# Patient Record
Sex: Female | Born: 1970
Health system: Southern US, Community
[De-identification: ages and names within clinical notes are randomized; demographics above are authoritative.]

## PROBLEM LIST (undated history)

## (undated) DIAGNOSIS — I7300 Raynaud's syndrome without gangrene: Secondary | ICD-10-CM

## (undated) DIAGNOSIS — M7989 Other specified soft tissue disorders: Secondary | ICD-10-CM

## (undated) DIAGNOSIS — T7840XA Allergy, unspecified, initial encounter: Secondary | ICD-10-CM

## (undated) DIAGNOSIS — M1990 Unspecified osteoarthritis, unspecified site: Secondary | ICD-10-CM

## (undated) DIAGNOSIS — E559 Vitamin D deficiency, unspecified: Secondary | ICD-10-CM

## (undated) DIAGNOSIS — F3281 Premenstrual dysphoric disorder: Secondary | ICD-10-CM

## (undated) DIAGNOSIS — E669 Obesity, unspecified: Secondary | ICD-10-CM

## (undated) DIAGNOSIS — K219 Gastro-esophageal reflux disease without esophagitis: Secondary | ICD-10-CM

## (undated) DIAGNOSIS — Z87442 Personal history of urinary calculi: Secondary | ICD-10-CM

## (undated) HISTORY — DX: Vitamin D deficiency, unspecified: E55.9

## (undated) HISTORY — DX: Other specified soft tissue disorders: M79.89

## (undated) HISTORY — DX: Gastro-esophageal reflux disease without esophagitis: K21.9

## (undated) HISTORY — DX: Obesity, unspecified: E66.9

## (undated) HISTORY — DX: Premenstrual dysphoric disorder: F32.81

## (undated) HISTORY — DX: Allergy, unspecified, initial encounter: T78.40XA

## (undated) HISTORY — PX: TONSILLECTOMY: SUR1361

---

## 2009-05-14 ENCOUNTER — Ambulatory Visit: Payer: Self-pay | Admitting: Urology

## 2009-08-22 ENCOUNTER — Ambulatory Visit: Payer: Self-pay | Admitting: Obstetrics and Gynecology

## 2010-05-06 ENCOUNTER — Ambulatory Visit: Payer: Self-pay | Admitting: Family Medicine

## 2010-09-18 ENCOUNTER — Ambulatory Visit: Payer: Self-pay | Admitting: Obstetrics and Gynecology

## 2011-10-14 ENCOUNTER — Ambulatory Visit: Payer: Self-pay | Admitting: Obstetrics and Gynecology

## 2011-10-29 ENCOUNTER — Ambulatory Visit: Payer: Self-pay | Admitting: Orthopedic Surgery

## 2012-10-14 ENCOUNTER — Ambulatory Visit: Payer: Self-pay | Admitting: Obstetrics and Gynecology

## 2014-02-08 ENCOUNTER — Ambulatory Visit: Payer: Self-pay | Admitting: Obstetrics and Gynecology

## 2014-11-27 ENCOUNTER — Other Ambulatory Visit: Payer: Self-pay | Admitting: Obstetrics and Gynecology

## 2014-11-27 DIAGNOSIS — Z1231 Encounter for screening mammogram for malignant neoplasm of breast: Secondary | ICD-10-CM

## 2015-02-11 ENCOUNTER — Ambulatory Visit
Admission: RE | Admit: 2015-02-11 | Discharge: 2015-02-11 | Disposition: A | Discharge status: Home / Self Care | Payer: Federal, State, Local not specified - PPO | Source: Ambulatory Visit | Attending: Obstetrics and Gynecology | Admitting: Obstetrics and Gynecology

## 2015-02-11 DIAGNOSIS — Z1231 Encounter for screening mammogram for malignant neoplasm of breast: Secondary | ICD-10-CM | POA: Diagnosis not present

## 2015-02-12 ENCOUNTER — Encounter: Payer: Self-pay | Admitting: *Deleted

## 2015-10-15 DIAGNOSIS — F341 Dysthymic disorder: Secondary | ICD-10-CM | POA: Diagnosis not present

## 2015-10-21 ENCOUNTER — Encounter: Payer: Self-pay | Admitting: *Deleted

## 2015-10-24 ENCOUNTER — Ambulatory Visit (INDEPENDENT_AMBULATORY_CARE_PROVIDER_SITE_OTHER): Payer: Federal, State, Local not specified - PPO | Admitting: Obstetrics and Gynecology

## 2015-10-24 ENCOUNTER — Encounter: Payer: Self-pay | Admitting: Obstetrics and Gynecology

## 2015-10-24 ENCOUNTER — Other Ambulatory Visit: Payer: Self-pay | Admitting: Obstetrics and Gynecology

## 2015-10-24 VITALS — BP 128/94 | HR 96 | Ht 61.0 in | Wt 199.7 lb

## 2015-10-24 DIAGNOSIS — Z01419 Encounter for gynecological examination (general) (routine) without abnormal findings: Secondary | ICD-10-CM

## 2015-10-24 DIAGNOSIS — R350 Frequency of micturition: Secondary | ICD-10-CM | POA: Diagnosis not present

## 2015-10-24 DIAGNOSIS — K589 Irritable bowel syndrome without diarrhea: Secondary | ICD-10-CM | POA: Diagnosis not present

## 2015-10-24 DIAGNOSIS — R300 Dysuria: Secondary | ICD-10-CM | POA: Diagnosis not present

## 2015-10-24 LAB — POCT URINALYSIS DIPSTICK
BILIRUBIN UA: NEGATIVE
GLUCOSE UA: NEGATIVE
Ketones, UA: NEGATIVE
LEUKOCYTES UA: NEGATIVE
NITRITE UA: NEGATIVE
Protein, UA: NEGATIVE
Spec Grav, UA: 1.01
Urobilinogen, UA: 0.2
pH, UA: 6

## 2015-10-24 MED ORDER — OXYBUTYNIN CHLORIDE ER 10 MG PO TB24: 10.0000 mg | ORAL_TABLET | Freq: Every day | ORAL | Status: DC

## 2015-10-24 NOTE — Progress Notes (Signed)
Subjective:   Sierra Moore is a 45 y.o. G42P0 Caucasian female here for a routine well-woman exam.  No LMP recorded. Patient is not currently having periods (Reason: Oral contraceptives).    Current complaints: frequent urination w/o pain- >6 times a day and >3 times a night; Last months has been having loose stools after eating- has been keeping a food diary but nothing has jumped out at her yet. No menses with current pills PCP: me       Does need &  desire labs  Social History: Sexual: heterosexual Marital Status: married Living situation: with family Occupation: PT Gaffer Tobacco/alcohol: no tobacco use Illicit drugs: no history of illicit drug use  The following portions of the patient's history were reviewed and updated as appropriate: allergies, current medications, past family history, past medical history, past social history, past surgical history and problem list.  Past Medical History Past Medical History  Diagnosis Date  . GERD (gastroesophageal reflux disease)   . PMDD (premenstrual dysphoric disorder)   . Vitamin D deficiency   . Leg swelling   . Obesity     Past Surgical History Past Surgical History  Procedure Laterality Date  . Tonsillectomy      Gynecologic History G2P0  No LMP recorded. Patient is not currently having periods (Reason: Oral contraceptives). Contraception: OCP (estrogen/progesterone) Last Pap: 2012. Results were: normal Last mammogram: 2016. Results were: normal   Obstetric History OB History  Gravida Para Term Preterm AB SAB TAB Ectopic Multiple Living  2         2    # Outcome Date GA Lbr Len/2nd Weight Sex Delivery Anes PTL Lv  2 Gravida 2000    F Vag-Spont   Y  1 Gravida 1997    M Vag-Spont   Y      Current Medications Current Outpatient Prescriptions on File Prior to Visit  Medication Sig Dispense Refill  . levonorgestrel-ethinyl estradiol (AVIANE) 0.1-20 MG-MCG tablet Take 1 tablet by mouth daily.     No  current facility-administered medications on file prior to visit.    Review of Systems Patient denies any headaches, blurred vision, shortness of breath, chest pain, abdominal pain, problems with bowel movements, urination, or intercourse.  Objective:  BP 128/94 mmHg  Pulse 96  Ht 5\' 1"  (1.549 m)  Wt 199 lb 11.2 oz (90.583 kg)  BMI 37.75 kg/m2 Physical Exam  General:  Well developed, well nourished, no acute distress. She is alert and oriented x3. Skin:  Warm and dry Neck:  Midline trachea, no thyromegaly or nodules Cardiovascular: Regular rate and rhythm, no murmur heard Lungs:  Effort normal, all lung fields clear to auscultation bilaterally Breasts:  No dominant palpable mass, retraction, or nipple discharge Abdomen:  Soft, non tender, no hepatosplenomegaly or masses Pelvic:  External genitalia is normal in appearance.  The vagina is normal in appearance. The cervix is bulbous, no CMT.  Thin prep pap is done with HR HPV cotesting. Uterus is felt to be normal size, shape, and contour.  No adnexal masses or tenderness noted. Extremities:  No swelling or varicosities noted Psych:  She has a normal mood and affect  Assessment:   Healthy well-woman exam Obesity Overactive bladder IBS  Plan:  Labs obtained Recommend trial of Ditropan- rx sent in and counseled on side effects and expected response. F/U 1 year  for AE , or sooner if needed Mammogram scheduled  Marcelles Clinard Rockney Ghee, CNM

## 2015-10-24 NOTE — Patient Instructions (Addendum)
Place annual gynecologic exam patient instructions here.  Thank you for enrolling in [facility]. Please follow the instructions below to securely access your online medical record. MyChart allows you to send messages to your doctor, view your test results, manage appointments, and more.   How Do I Sign Up? 1. In your Internet browser, go to [employer] and enter [email or web address]. 2. Click on the Sign Up Now link in the Sign In box. You will see the New Member Sign Up page. 3. Enter your MyChart Access Code exactly as it appears below. You will not need to use this code after you've completed the sign-up process. If you do not sign up before the expiration date, you must request a new code.  MyChart Access Code: [email or web address] Expires: 11/26/2015  4:22 PM  4. Enter your Social Security Number ([id number]) and Date of Birth (mm/dd/yyyy) as indicated and click Submit. You will be taken to the next sign-up page. 5. Create a MyChart ID. This will be your MyChart login ID and cannot be changed, so think of one that is secure and easy to remember. 6. Create a MyChart password. You can change your password at any time. 7. Enter your Password Reset Question and Answer. This can be used at a later time if you forget your password.  8. Enter your e-mail address. You will receive e-mail notification when new information is available in [facility]. 9. Click Sign Up. You can now view your medical record.   Additional Information Remember, MyChart is NOT to be used for urgent needs. For medical emergencies, dial 911.   Overactive Bladder, Adult Overactive bladder is a group of urinary symptoms. With overactive bladder, you may suddenly feel the need to pass urine (urinate) right away. After feeling this sudden urge, you might also leak urine if you cannot get to the bathroom fast enough (urinary incontinence). These symptoms might interfere with your daily work or social activities.  Overactive bladder symptoms may also wake you up at night. Overactive bladder affects the nerve signals between your bladder and your brain. Your bladder may get the signal to empty before it is full. Very sensitive muscles can also make your bladder squeeze too soon. CAUSES Many things can cause an overactive bladder. Possible causes include:  Urinary tract infection.  Infection of nearby tissues, such as the prostate.  Prostate enlargement.  Being pregnant with twins or more (multiples).  Surgery on the uterus or urethra.  Bladder stones, inflammation, or tumors.  Drinking too much caffeine or alcohol.  Certain medicines, especially those that you take to help your body get rid of extra fluid (diuretics) by increasing urine production.  Muscle or nerve weakness, especially from:  A spinal cord injury.  Stroke.  Multiple sclerosis.  Parkinson disease.  Diabetes. This can cause a high urine volume that fills the bladder so quickly that the normal urge to urinate is triggered very strongly.  Constipation. A buildup of too much stool can put pressure on your bladder. RISK FACTORS You may be at greater risk for overactive bladder if you:  Are an older adult.  Smoke.  Are going through menopause.  Have prostate problems.  Have a neurological disease, such as stroke, dementia, Parkinson disease, or multiple sclerosis (MS).  Eat or drink things that irritate the bladder. These include alcohol, spicy food, and caffeine.  Are overweight or obese. SIGNS AND SYMPTOMS  The signs and symptoms of an overactive bladder include:  Sudden, strong urges to urinate.  Leaking  urine.  Urinating eight or more times per day.  Waking up to urinate two or more times per night. DIAGNOSIS Your health care provider may suspect overactive bladder based on your symptoms. The health care provider will do a physical exam and take your medical history. Blood or urine tests may also be  done. For example, you might need to have a bladder function test to check how well you can hold your urine. You might also need to see a health care provider who specializes in the urinary tract (urologist). TREATMENT Treatment for overactive bladder depends on the cause of your condition and whether it is mild or severe. Certain treatments can be done in your health care provider's office or clinic. You can also make lifestyle changes at home. Options include: Behavioral Treatments  Biofeedback. A specialist uses sensors to help you become aware of your body's signals.  Keeping a daily log of when you need to urinate and what happens after the urge. This may help you manage your condition.  Bladder training. This helps you learn to control the urge to urinate by following a schedule that directs you to urinate at regular intervals (timed voiding). At first, you might have to wait a few minutes after feeling the urge. In time, you should be able to schedule bathroom visits an hour or more apart.  Kegel exercises. These are exercises to strengthen the pelvic floor muscles, which support the bladder. Toning these muscles can help you control urination, even if your bladder muscles are overactive. A specialist will teach you how to do these exercises correctly. They require daily practice.  Weight loss. If you are obese or overweight, losing weight might relieve your symptoms of overactive bladder. Talk to your health care provider about losing weight and whether there is a specific program or method that would work best for you.  Diet change. This might help if constipation is making your overactive bladder worse. Your health care provider or a dietitian can explain ways to change what you eat to ease constipation. You might also need to consume less alcohol and caffeine or drink other fluids at different times of the day.  Stopping smoking.  Wearing pads to absorb leakage while you wait for other  treatments to take effect. Physical Treatments  Electrical stimulation. Electrodes send gentle pulses of electricity to strengthen the nerves or muscles that help to control the bladder. Sometimes, the electrodes are placed outside of the body. In other cases, they might be placed inside the body (implanted). This treatment can take several months to have an effect.  Supportive devices. Women may need a plastic device that fits into the vagina and supports the bladder (pessary). Medicines Several medicines can help treat overactive bladder and are usually used along with other treatments. Some are injected into the muscles involved in urination. Others come in pill form. Your health care provider may prescribe:  Antispasmodics. These medicines block the signals that the nerves send to the bladder. This keeps the bladder from releasing urine at the wrong time.  Tricyclic antidepressants. These types of antidepressants also relax bladder muscles. Surgery  You may have a device implanted to help manage the nerve signals that indicate when you need to urinate.  You may have surgery to implant electrodes for electrical stimulation.  Sometimes, very severe cases of overactive bladder require surgery to change the shape of the bladder. HOME CARE INSTRUCTIONS   Take medicines only as directed by your health care provider.  Use any implants or a pessary as directed by your health care provider.  Make any diet or lifestyle changes that are recommended by your health care provider. These might include:  Drinking less fluid or drinking at different times of the day. If you need to urinate often during the night, you may need to stop drinking fluids early in the evening.  Cutting down on caffeine or alcohol. Both can make an overactive bladder worse. Caffeine is found in coffee, tea, and sodas.  Doing Kegel exercises to strengthen muscles.  Losing weight if you need to.  Eating a healthy and  balanced diet to prevent constipation.  Keep a journal or log to track how much and when you drink and also when you feel the need to urinate. This will help your health care provider to monitor your condition. SEEK MEDICAL CARE IF:  Your symptoms do not get better after treatment.  Your pain and discomfort are getting worse.  You have more frequent urges to urinate.  You have a fever. SEEK IMMEDIATE MEDICAL CARE IF: You are not able to control your bladder at all.   This information is not intended to replace advice given to you by your health care provider. Make sure you discuss any questions you have with your health care provider.   Document Released: 04/18/2009 Document Revised: 07/13/2014 Document Reviewed: 11/15/2013 Elsevier Interactive Patient Education 2016 [place].   Diet for Irritable Bowel Syndrome When you have irritable bowel syndrome (IBS), the foods you eat and your eating habits are very important. IBS may cause various symptoms, such as abdominal pain, constipation, or diarrhea. Choosing the right foods can help ease discomfort caused by these symptoms. Work with your health care provider and dietitian to find the best eating plan to help control your symptoms. WHAT GENERAL GUIDELINES DO I NEED TO FOLLOW?  Keep a food diary. This will help you identify foods that cause symptoms. Write down:  What you eat and when.  What symptoms you have.  When symptoms occur in relation to your meals.  Avoid foods that cause symptoms. Talk with your dietitian about other ways to get the same nutrients that are in these foods.  Eat more foods that contain fiber. Take a fiber supplement if directed by your dietitian.  Eat your meals slowly, in a relaxed setting.  Aim to eat 5-6 small meals per day. Do not skip meals.  Drink enough fluids to keep your urine clear or pale yellow.  Ask your health care provider if you should take an over-the-counter probiotic during  flare-ups to help restore healthy gut bacteria.  If you have cramping or diarrhea, try making your meals low in fat and high in carbohydrates. Examples of carbohydrates are pasta, rice, whole grain breads and cereals, fruits, and vegetables.  If dairy products cause your symptoms to flare up, try eating less of them. You might be able to handle yogurt better than other dairy products because it contains bacteria that help with digestion. WHAT FOODS ARE NOT RECOMMENDED? The following are some foods and drinks that may worsen your symptoms:  Fatty foods, such as [place] fries.  Milk products, such as cheese or ice cream.  Chocolate.  Alcohol.  Products with caffeine, such as coffee.  Carbonated drinks, such as soda. The items listed above may not be a complete list of foods and beverages to avoid. Contact your dietitian for more information. WHAT FOODS ARE GOOD SOURCES OF FIBER? Your health care provider or  dietitian may recommend that you eat more foods that contain fiber. Fiber can help reduce constipation and other IBS symptoms. Add foods with fiber to your diet a little at a time so that your body can get used to them. Too much fiber at once might cause gas and swelling of your abdomen. The following are some foods that are good sources of fiber:  Apples.  Peaches.  Pears.  Berries.  Figs.  Broccoli (raw).  Cabbage.  Carrots.  Raw peas.  Kidney beans.  Lima beans.  Whole grain bread.  Whole grain cereal. FOR MORE INFORMATION  International Foundation for Functional Gastrointestinal Disorders: www.iffgd.[employer] of Diabetes and Digestive and Kidney Diseases: NetworkAffair.co.za.aspx   This information is not intended to replace advice given to you by your health care provider. Make sure you discuss any questions you have with your health care provider.   Document Released: 09/12/2003  Document Revised: 07/13/2014 Document Reviewed: 09/22/2013 Elsevier Interactive Patient Education [employer].

## 2015-10-25 ENCOUNTER — Other Ambulatory Visit: Payer: Self-pay | Admitting: Obstetrics and Gynecology

## 2015-10-25 ENCOUNTER — Telehealth: Payer: Self-pay | Admitting: *Deleted

## 2015-10-25 DIAGNOSIS — R7303 Prediabetes: Secondary | ICD-10-CM

## 2015-10-25 DIAGNOSIS — E559 Vitamin D deficiency, unspecified: Secondary | ICD-10-CM | POA: Insufficient documentation

## 2015-10-25 DIAGNOSIS — E119 Type 2 diabetes mellitus without complications: Secondary | ICD-10-CM | POA: Insufficient documentation

## 2015-10-25 DIAGNOSIS — Z87898 Personal history of other specified conditions: Secondary | ICD-10-CM | POA: Insufficient documentation

## 2015-10-25 LAB — VITAMIN D 25 HYDROXY (VIT D DEFICIENCY, FRACTURES): VIT D 25 HYDROXY: 26 ng/mL — AB (ref 30.0–100.0)

## 2015-10-25 LAB — COMPREHENSIVE METABOLIC PANEL
A/G RATIO: 1.5 (ref 1.2–2.2)
ALT: 14 IU/L (ref 0–32)
AST: 12 IU/L (ref 0–40)
Albumin: 4.2 g/dL (ref 3.5–5.5)
Alkaline Phosphatase: 110 IU/L (ref 39–117)
BILIRUBIN TOTAL: 0.3 mg/dL (ref 0.0–1.2)
BUN/Creatinine Ratio: 13 (ref 9–23)
BUN: 10 mg/dL (ref 6–24)
CHLORIDE: 100 mmol/L (ref 96–106)
CO2: 20 mmol/L (ref 18–29)
Calcium: 9 mg/dL (ref 8.7–10.2)
Creatinine, Ser: 0.76 mg/dL (ref 0.57–1.00)
GFR calc non Af Amer: 94 mL/min/{1.73_m2} (ref >59)
GFR, EST AFRICAN AMERICAN: 109 mL/min/{1.73_m2} (ref >59)
GLOBULIN, TOTAL: 2.8 g/dL (ref 1.5–4.5)
Glucose: 103 mg/dL — ABNORMAL HIGH (ref 65–99)
POTASSIUM: 4.2 mmol/L (ref 3.5–5.2)
SODIUM: 138 mmol/L (ref 134–144)
Total Protein: 7 g/dL (ref 6.0–8.5)

## 2015-10-25 LAB — LIPID PANEL
CHOL/HDL RATIO: 4.7 ratio — AB (ref 0.0–4.4)
Cholesterol, Total: 178 mg/dL (ref 100–199)
HDL: 38 mg/dL — AB (ref >39)
LDL CALC: 115 mg/dL — AB (ref 0–99)
TRIGLYCERIDES: 127 mg/dL (ref 0–149)
VLDL Cholesterol Cal: 25 mg/dL (ref 5–40)

## 2015-10-25 LAB — HEMOGLOBIN A1C
Est. average glucose Bld gHb Est-mCnc: 128 mg/dL
Hgb A1c MFr Bld: 6.1 % — ABNORMAL HIGH (ref 4.8–5.6)

## 2015-10-25 LAB — CYTOLOGY - PAP

## 2015-10-25 MED ORDER — VITAMIN D (ERGOCALCIFEROL) 1.25 MG (50000 UNIT) PO CAPS: 50000.0000 [IU] | ORAL_CAPSULE | ORAL | Status: DC

## 2015-10-25 NOTE — Telephone Encounter (Signed)
Mailed pt all info about lab results

## 2015-10-25 NOTE — Telephone Encounter (Signed)
-----   Message from Joylene Igo, [place] sent at 10/25/2015  3:15 PM EDT ----- Please let her know lab results, and mail info on vit D def and pre-diabetes. Let her know I sent in script for vit D weekly supplements, and I want to recheck labs in 3 months to see if they are improved, need to work hard on low chol and low carb diet, exercise and weight loss. If levels don't improve, high chance of full blown diabetes with in 5 years

## 2015-10-31 DIAGNOSIS — F341 Dysthymic disorder: Secondary | ICD-10-CM | POA: Diagnosis not present

## 2015-11-19 DIAGNOSIS — F341 Dysthymic disorder: Secondary | ICD-10-CM | POA: Diagnosis not present

## 2015-12-05 DIAGNOSIS — F411 Generalized anxiety disorder: Secondary | ICD-10-CM | POA: Diagnosis not present

## 2015-12-26 DIAGNOSIS — F341 Dysthymic disorder: Secondary | ICD-10-CM | POA: Diagnosis not present

## 2016-01-08 ENCOUNTER — Other Ambulatory Visit: Payer: Self-pay | Admitting: *Deleted

## 2016-01-08 MED ORDER — LEVONORGESTREL-ETHINYL ESTRAD 0.1-20 MG-MCG PO TABS: 1.0000 | ORAL_TABLET | Freq: Every day | ORAL | Status: DC

## 2016-01-16 DIAGNOSIS — F341 Dysthymic disorder: Secondary | ICD-10-CM | POA: Diagnosis not present

## 2016-01-27 ENCOUNTER — Telehealth: Payer: Self-pay | Admitting: Obstetrics and Gynecology

## 2016-01-27 NOTE — Telephone Encounter (Signed)
Patient called stating she needs a refill on her birth control. She was told by her pharmacy that it was too early to refill but patient stated she takes them continually and [facility] is unaware of that. She also stated that her script is only written for 2 months and she didn't understand that. She uses the [facility] on [address]. Thanks

## 2016-01-29 ENCOUNTER — Other Ambulatory Visit: Payer: Self-pay | Admitting: *Deleted

## 2016-01-29 MED ORDER — LEVONORGESTREL-ETHINYL ESTRAD 0.1-20 MG-MCG PO TABS: 1.0000 | ORAL_TABLET | Freq: Every day | ORAL | 4 refills | Status: DC

## 2016-02-06 DIAGNOSIS — F341 Dysthymic disorder: Secondary | ICD-10-CM | POA: Diagnosis not present

## 2016-02-10 ENCOUNTER — Telehealth: Payer: Self-pay | Admitting: Family

## 2016-02-10 ENCOUNTER — Ambulatory Visit (INDEPENDENT_AMBULATORY_CARE_PROVIDER_SITE_OTHER): Payer: Federal, State, Local not specified - PPO | Admitting: Family

## 2016-02-10 ENCOUNTER — Encounter: Payer: Self-pay | Admitting: Family

## 2016-02-10 ENCOUNTER — Telehealth: Payer: Self-pay | Admitting: *Deleted

## 2016-02-10 VITALS — BP 132/84 | HR 106 | Temp 98.4°F | Wt 202.4 lb

## 2016-02-10 DIAGNOSIS — R319 Hematuria, unspecified: Secondary | ICD-10-CM

## 2016-02-10 DIAGNOSIS — J302 Other seasonal allergic rhinitis: Secondary | ICD-10-CM

## 2016-02-10 DIAGNOSIS — Z0001 Encounter for general adult medical examination with abnormal findings: Secondary | ICD-10-CM | POA: Insufficient documentation

## 2016-02-10 DIAGNOSIS — R350 Frequency of micturition: Secondary | ICD-10-CM

## 2016-02-10 DIAGNOSIS — R21 Rash and other nonspecific skin eruption: Secondary | ICD-10-CM | POA: Diagnosis not present

## 2016-02-10 DIAGNOSIS — R6889 Other general symptoms and signs: Secondary | ICD-10-CM | POA: Diagnosis not present

## 2016-02-10 LAB — URINALYSIS, ROUTINE W REFLEX MICROSCOPIC
BILIRUBIN URINE: NEGATIVE
KETONES UR: NEGATIVE
LEUKOCYTES UA: NEGATIVE
NITRITE: NEGATIVE
PH: 6 (ref 5.0–8.0)
Specific Gravity, Urine: 1.025 (ref 1.000–1.030)
Urine Glucose: NEGATIVE
Urobilinogen, UA: 0.2 (ref 0.0–1.0)

## 2016-02-10 LAB — POCT URINALYSIS DIPSTICK
Bilirubin, UA: NEGATIVE
GLUCOSE UA: NEGATIVE
LEUKOCYTES UA: NEGATIVE
NITRITE UA: NEGATIVE
SPEC GRAV UA: 1.02
UROBILINOGEN UA: 0.2
pH, UA: 5.5

## 2016-02-10 LAB — C-REACTIVE PROTEIN: CRP: 3.2 mg/dL (ref 0.5–20.0)

## 2016-02-10 LAB — SEDIMENTATION RATE: SED RATE: 54 mm/h — AB (ref 0–20)

## 2016-02-10 MED ORDER — TRIAMCINOLONE 0.1 % CREAM:EUCERIN CREAM 1:1: 1.0000 "application " | TOPICAL_CREAM | Freq: Two times a day (BID) | CUTANEOUS | 0 refills | Status: DC

## 2016-02-10 NOTE — Progress Notes (Addendum)
Subjective:    Patient ID: Sierra Moore, female    DOB: 1970/08/26, 45 y.o.   MRN: [id number]  CC: Sierra Moore is a 45 y.o. female who presents today to establish care as new patient.  HPI: Patient here as a new patient with multiple complaints. She has not been seen in bowel about provider has been basically going to urgent cares for acute visits when she's needed them.   Has a compliant of congestion, sneezing, 'face hurts'. Started yesterday. Alternated tylenol and advil which helps. Has tried flonase without much relief. No fever, chills. Has seasonal allergies. Occasionally takes Benadryl.  She also has complaint of itchy, red skin on chin on face for months. Resolves today. Notes broken blood vessels on face.No relieving or aggravating factors. No new skin creams.  She also notes redness on palms which are itching. Skin intact, no blisters. Appointment with dermatology in one month. No unusual joint pains. Saw rheumatology 4 years ago when podiatrist thought she had psoriatic arthritis.  She also complains of heat intolerance for years. Describes that in heat she gets very dizzy, over heated, nausea, whole body 'turns red and sweaty'. This occurs around dance classes. Improves with ice and rest. No syncope. No chest pain.   She also notes urinary frequency and CNM suggested OAB. No dysuria.   She had recent lab work 10/2015 by [facility]. Pap was negative for malignancy, HPV. A1c elevated 6.1. Vitamin D low and she was started on a supplement. Last mammogram 2016, normal. Scheduled for 8/9.   Requesting immunization history.   No family h/o colon cancer, SCD.      HISTORY:  Past Medical History:  Diagnosis Date  . Allergy   . GERD (gastroesophageal reflux disease)   . Leg swelling   . Obesity   . PMDD (premenstrual dysphoric disorder)   . Vitamin D deficiency    Past Surgical History:  Procedure Laterality Date  . TONSILLECTOMY     Family History  Problem Relation Age  of Onset  . Arthritis Mother   . Raynaud syndrome Mother   . Arthritis Father     Allergies: Penicillins Current Outpatient Prescriptions on File Prior to Visit  Medication Sig Dispense Refill  . levonorgestrel-ethinyl estradiol (AVIANE) 0.1-20 MG-MCG tablet Take 1 tablet by mouth daily. 3 Package 4  . Vitamin D, Ergocalciferol, (DRISDOL) 50000 units CAPS capsule Take 1 capsule (50,000 Units total) by mouth every 7 (seven) days. 30 capsule 1   No current facility-administered medications on file prior to visit.     Social History  Substance Use Topics  . Smoking status: Never Smoker  . Smokeless tobacco: Never Used  . Alcohol use No    Review of Systems  Constitutional: Negative for chills, fever and unexpected weight change.  HENT: Positive for congestion, postnasal drip, sinus pressure and sneezing. Negative for ear pain, facial swelling and sore throat.   Eyes: Positive for itching.  Respiratory: Negative for cough and shortness of breath.   Cardiovascular: Negative for chest pain, palpitations and leg swelling.  Gastrointestinal: Negative for nausea and vomiting.  Genitourinary: Positive for frequency. Negative for difficulty urinating, dysuria and flank pain.  Musculoskeletal: Negative for arthralgias and myalgias.  Skin: Positive for rash.  Neurological: Positive for dizziness (occasional when gets overheated). Negative for headaches.  Hematological: Negative for adenopathy.  Psychiatric/Behavioral: Negative for confusion.      Objective:    BP 132/84 (BP Location: Right Arm, Patient Position: Sitting, Cuff Size: Large)  Pulse (!) 106   Temp 98.4 F (36.9 C) (Oral)   Wt 202 lb 6.4 oz (91.8 kg)   LMP  (LMP Unknown)   SpO2 96%   BMI 38.24 kg/m  BP Readings from Last 3 Encounters:  02/10/16 132/84  10/24/15 (!) 128/94   Wt Readings from Last 3 Encounters:  02/10/16 202 lb 6.4 oz (91.8 kg)  10/24/15 199 lb 11.2 oz (90.6 kg)    Physical Exam    Constitutional: She appears well-developed and well-nourished.  Eyes: Conjunctivae are normal.  Cardiovascular: Normal rate, regular rhythm, normal heart sounds and normal pulses.   Pulmonary/Chest: Effort normal and breath sounds normal. She has no wheezes. She has no rhonchi. She has no rales.  Neurological: She is alert.  Skin: Skin is warm and dry.  Psychiatric: She has a normal mood and affect. Her speech is normal and behavior is normal. Thought content normal.  Vitals reviewed.      Assessment & Plan:   Problem List Items Addressed This Visit      Respiratory   Seasonal allergies    Current congestion x 1 day. Likely seasonal allergies. Trial of anthistamine. Return precautions given.         Musculoskeletal and Integument   Rash and nonspecific skin eruption - Primary    Suspect eczema. Alternately roseaca. Trial of topical cortizone for short course. Pending lab work for autoimmune as rash also c/w macular SLE rash.      Relevant Medications   Triamcinolone Acetonide (TRIAMCINOLONE 0.1 % CREAM : EUCERIN) CREA   Other Relevant Orders   ANA w/Reflex if Positive   C-reactive protein   CYCLIC CITRUL PEPTIDE ANTIBODY, IGG/IGA   Sedimentation rate     Other   Urinary frequency    No symptoms today. H/o of blood in urine and asymptomatic UTI. Moderate blood seen in urine dip. Will follow.       Relevant Orders   POCT urinalysis dipstick (Completed)   Urinalysis, Routine w reflex microscopic (not at [facility])   Heat intolerance    No syncopal episodes. No chest pain. Episodes resolve with rest, ice, water. Encouraged patient adequate hydration and primarily to avoid heat. Reassured with no worrisome cardiac symptoms. will continue to follow.      Relevant Orders   Rheumatoid Factor   Hematuria    Moderate hematuria. Per patient, this has been an issue in the past and she had cytoscope. No pyuria to suggest infectious etiology. Pending microscopy and urine culture. Will  refer to urology if etiology unclear/persists when we recheck urine in one month.       Relevant Orders   Urine Microscopic Only   CULTURE, URINE COMPREHENSIVE    Other Visit Diagnoses   None.      I have discontinued Ms. Stonesifer's oxybutynin. I am also having her start on triamcinolone 0.1 % cream : eucerin. Additionally, I am having her maintain her Vitamin D (Ergocalciferol) and levonorgestrel-ethinyl estradiol.   Meds ordered this encounter  Medications  . Triamcinolone Acetonide (TRIAMCINOLONE 0.1 % CREAM : EUCERIN) CREA    Sig: Apply 1 application topically 2 (two) times daily.    Dispense:  1 each    Refill:  0    Order Specific Question:   Supervising Provider    Answer:   Crecencio Mc [[id number]]    Return precautions given.   Risks, benefits, and alternatives of the medications and treatment plan prescribed today were discussed, and patient expressed understanding.  Education regarding symptom management and diagnosis given to patient on AVS.  Continue to follow with Mable Paris, FNP for routine health maintenance.   Kirby Funk and I agreed with plan.   Mable Paris, FNP

## 2016-02-10 NOTE — Assessment & Plan Note (Addendum)
Suspect eczema. Alternately roseaca. Trial of topical cortizone for short course. Pending lab work for autoimmune as rash also c/w macular SLE rash.

## 2016-02-10 NOTE — Telephone Encounter (Signed)
Left message for patient to return call.

## 2016-02-10 NOTE — Assessment & Plan Note (Addendum)
Declined pelvic or CBE as had that done 10/2015. UTD on Pap. Pending mammogram. Requesting immunizations records.

## 2016-02-10 NOTE — Assessment & Plan Note (Signed)
No syncopal episodes. No chest pain. Episodes resolve with rest, ice, water. Encouraged patient adequate hydration and primarily to avoid heat. Reassured with no worrisome cardiac symptoms. will continue to follow.

## 2016-02-10 NOTE — Telephone Encounter (Signed)
Please call patient and let her know that she has moderate blood in her urine. I'm running urine culture and if grows any bacteria, ill send for antibiotic.  I would like for her to follow-up in one month with a lab appointment to check her urine again. If the blood remains persistent, I would also like to send her to urology. I know she is been in the past however on a make sure that this is adequately valuated.

## 2016-02-10 NOTE — Addendum Note (Signed)
Addended by: Burnard Hawthorne on: 02/10/2016 01:15 PM   Modules accepted: Orders

## 2016-02-10 NOTE — Telephone Encounter (Signed)
Notified pt. 

## 2016-02-10 NOTE — Progress Notes (Signed)
Pre visit review using our clinic review tool, if applicable. No additional management support is needed unless otherwise documented below in the visit note. 

## 2016-02-10 NOTE — Addendum Note (Signed)
Addended by: Leeanne Rio on: 02/10/2016 11:07 AM   Modules accepted: Orders

## 2016-02-10 NOTE — Assessment & Plan Note (Addendum)
Moderate hematuria. Per patient, this has been an issue in the past and she had cytoscope. No pyuria to suggest infectious etiology. Pending microscopy and urine culture. Will refer to urology if etiology unclear/persists when we recheck urine in one month.

## 2016-02-10 NOTE — Addendum Note (Signed)
Addended by: Burnard Hawthorne on: 02/10/2016 11:01 AM   Modules accepted: Orders

## 2016-02-10 NOTE — Assessment & Plan Note (Addendum)
Current congestion x 1 day. Likely seasonal allergies. Trial of anthistamine. Return precautions given.

## 2016-02-10 NOTE — Assessment & Plan Note (Addendum)
No symptoms today. H/o of blood in urine and asymptomatic UTI. Moderate blood seen in urine dip. Will follow.

## 2016-02-10 NOTE — Telephone Encounter (Signed)
Pt requested lab results Pt contact [phone]

## 2016-02-10 NOTE — Patient Instructions (Addendum)
Pleasure meeting you and I look forward having his a patient. Please go the lab for urine testing and lab work.  Please request your immunizations from your GYN and have them sent to our office.  Health Maintenance, Female Adopting a healthy lifestyle and getting preventive care can go a long way to promote health and wellness. Talk with your health care provider about what schedule of regular examinations is right for you. This is a good chance for you to check in with your provider about disease prevention and staying healthy. In between checkups, there are plenty of things you can do on your own. Experts have done a lot of research about which lifestyle changes and preventive measures are most likely to keep you healthy. Ask your health care provider for more information. WEIGHT AND DIET  Eat a healthy diet  Be sure to include plenty of vegetables, fruits, low-fat dairy products, and lean protein.  Do not eat a lot of foods high in solid fats, added sugars, or salt.  Get regular exercise. This is one of the most important things you can do for your health.  Most adults should exercise for at least 150 minutes each week. The exercise should increase your heart rate and make you sweat (moderate-intensity exercise).  Most adults should also do strengthening exercises at least twice a week. This is in addition to the moderate-intensity exercise.  Maintain a healthy weight  Body mass index (BMI) is a measurement that can be used to identify possible weight problems. It estimates body fat based on height and weight. Your health care provider can help determine your BMI and help you achieve or maintain a healthy weight.  For females 51 years of age and older:   A BMI below 18.5 is considered underweight.  A BMI of 18.5 to 24.9 is normal.  A BMI of 25 to 29.9 is considered overweight.  A BMI of 30 and above is considered obese.  Watch levels of cholesterol and blood lipids  You should  start having your blood tested for lipids and cholesterol at 47 years of age, then have this test every 5 years.  You may need to have your cholesterol levels checked more often if:  Your lipid or cholesterol levels are high.  You are older than 47 years of age.  You are at high risk for heart disease.  CANCER SCREENING   Lung Cancer  Lung cancer screening is recommended for adults 37-70 years old who are at high risk for lung cancer because of a history of smoking.  A yearly low-dose CT scan of the lungs is recommended for people who:  Currently smoke.  Have quit within the past 15 years.  Have at least a 30-pack-year history of smoking. A pack year is smoking an average of one pack of cigarettes a day for 1 year.  Yearly screening should continue until it has been 15 years since you quit.  Yearly screening should stop if you develop a health problem that would prevent you from having lung cancer treatment.  Breast Cancer  Practice breast self-awareness. This means understanding how your breasts normally appear and feel.  It also means doing regular breast self-exams. Let your health care provider know about any changes, no matter how small.  If you are in your 20s or 30s, you should have a clinical breast exam (CBE) by a health care provider every 1-3 years as part of a regular health exam.  If you are 40 or  older, have a CBE every year. Also consider having a breast X-ray (mammogram) every year.  If you have a family history of breast cancer, talk to your health care provider about genetic screening.  If you are at high risk for breast cancer, talk to your health care provider about having an MRI and a mammogram every year.  Breast cancer gene (BRCA) assessment is recommended for women who have family members with BRCA-related cancers. BRCA-related cancers include:  Breast.  Ovarian.  Tubal.  Peritoneal cancers.  Results of the assessment will determine the  need for genetic counseling and BRCA1 and BRCA2 testing. Cervical Cancer Your health care provider may recommend that you be screened regularly for cancer of the pelvic organs (ovaries, uterus, and vagina). This screening involves a pelvic examination, including checking for microscopic changes to the surface of your cervix (Pap test). You may be encouraged to have this screening done every 3 years, beginning at age 79.  For women ages 9-65, health care providers may recommend pelvic exams and Pap testing every 3 years, or they may recommend the Pap and pelvic exam, combined with testing for human papilloma virus (HPV), every 5 years. Some types of HPV increase your risk of cervical cancer. Testing for HPV may also be done on women of any age with unclear Pap test results.  Other health care providers may not recommend any screening for nonpregnant women who are considered low risk for pelvic cancer and who do not have symptoms. Ask your health care provider if a screening pelvic exam is right for you.  If you have had past treatment for cervical cancer or a condition that could lead to cancer, you need Pap tests and screening for cancer for at least 20 years after your treatment. If Pap tests have been discontinued, your risk factors (such as having a new sexual partner) need to be reassessed to determine if screening should resume. Some women have medical problems that increase the chance of getting cervical cancer. In these cases, your health care provider may recommend more frequent screening and Pap tests. Colorectal Cancer  This type of cancer can be detected and often prevented.  Routine colorectal cancer screening usually begins at 47 years of age and continues through 47 years of age.  Your health care provider may recommend screening at an earlier age if you have risk factors for colon cancer.  Your health care provider may also recommend using home test kits to check for hidden blood in  the stool.  A small camera at the end of a tube can be used to examine your colon directly (sigmoidoscopy or colonoscopy). This is done to check for the earliest forms of colorectal cancer.  Routine screening usually begins at age 12.  Direct examination of the colon should be repeated every 5-10 years through 47 years of age. However, you may need to be screened more often if early forms of precancerous polyps or small growths are found. Skin Cancer  Check your skin from head to toe regularly.  Tell your health care provider about any new moles or changes in moles, especially if there is a change in a mole's shape or color.  Also tell your health care provider if you have a mole that is larger than the size of a pencil eraser.  Always use sunscreen. Apply sunscreen liberally and repeatedly throughout the day.  Protect yourself by wearing long sleeves, pants, a wide-brimmed hat, and sunglasses whenever you are outside. HEART DISEASE, DIABETES,  AND HIGH BLOOD PRESSURE   High blood pressure causes heart disease and increases the risk of stroke. High blood pressure is more likely to develop in:  People who have blood pressure in the high end of the normal range (130-139/85-89 mm Hg).  People who are overweight or obese.  People who are African American.  If you are 18-39 years of age, have your blood pressure checked every 3-5 years. If you are 40 years of age or older, have your blood pressure checked every year. You should have your blood pressure measured twice--once when you are at a hospital or clinic, and once when you are not at a hospital or clinic. Record the average of the two measurements. To check your blood pressure when you are not at a hospital or clinic, you can use:  An automated blood pressure machine at a pharmacy.  A home blood pressure monitor.  If you are between 55 years and 79 years old, ask your health care provider if you should take aspirin to prevent  strokes.  Have regular diabetes screenings. This involves taking a blood sample to check your fasting blood sugar level.  If you are at a normal weight and have a low risk for diabetes, have this test once every three years after 47 years of age.  If you are overweight and have a high risk for diabetes, consider being tested at a younger age or more often. PREVENTING INFECTION  Hepatitis B  If you have a higher risk for hepatitis B, you should be screened for this virus. You are considered at high risk for hepatitis B if:  You were born in a country where hepatitis B is common. Ask your health care provider which countries are considered high risk.  Your parents were born in a high-risk country, and you have not been immunized against hepatitis B (hepatitis B vaccine).  You have HIV or AIDS.  You use needles to inject street drugs.  You live with someone who has hepatitis B.  You have had sex with someone who has hepatitis B.  You get hemodialysis treatment.  You take certain medicines for conditions, including cancer, organ transplantation, and autoimmune conditions. Hepatitis C  Blood testing is recommended for:  Everyone born from 1945 through 1965.  Anyone with known risk factors for hepatitis C. Sexually transmitted infections (STIs)  You should be screened for sexually transmitted infections (STIs) including gonorrhea and chlamydia if:  You are sexually active and are younger than 47 years of age.  You are older than 47 years of age and your health care provider tells you that you are at risk for this type of infection.  Your sexual activity has changed since you were last screened and you are at an increased risk for chlamydia or gonorrhea. Ask your health care provider if you are at risk.  If you do not have HIV, but are at risk, it may be recommended that you take a prescription medicine daily to prevent HIV infection. This is called pre-exposure prophylaxis  (PrEP). You are considered at risk if:  You are sexually active and do not regularly use condoms or know the HIV status of your partner(s).  You take drugs by injection.  You are sexually active with a partner who has HIV. Talk with your health care provider about whether you are at high risk of being infected with HIV. If you choose to begin PrEP, you should first be tested for HIV. You should then be   tested every 3 months for as long as you are taking PrEP.  PREGNANCY   If you are premenopausal and you may become pregnant, ask your health care provider about preconception counseling.  If you may become pregnant, take 400 to 800 micrograms (mcg) of folic acid every day.  If you want to prevent pregnancy, talk to your health care provider about birth control (contraception). OSTEOPOROSIS AND MENOPAUSE   Osteoporosis is a disease in which the bones lose minerals and strength with aging. This can result in serious bone fractures. Your risk for osteoporosis can be identified using a bone density scan.  If you are 43 years of age or older, or if you are at risk for osteoporosis and fractures, ask your health care provider if you should be screened.  Ask your health care provider whether you should take a calcium or vitamin D supplement to lower your risk for osteoporosis.  Menopause may have certain physical symptoms and risks.  Hormone replacement therapy may reduce some of these symptoms and risks. Talk to your health care provider about whether hormone replacement therapy is right for you.  HOME CARE INSTRUCTIONS   Schedule regular health, dental, and eye exams.  Stay current with your immunizations.   Do not use any tobacco products including cigarettes, chewing tobacco, or electronic cigarettes.  If you are pregnant, do not drink alcohol.  If you are breastfeeding, limit how much and how often you drink alcohol.  Limit alcohol intake to no more than 1 drink per day for  nonpregnant women. One drink equals 12 ounces of beer, 5 ounces of wine, or 1 ounces of hard liquor.  Do not use street drugs.  Do not share needles.  Ask your health care provider for help if you need support or information about quitting drugs.  Tell your health care provider if you often feel depressed.  Tell your health care provider if you have ever been abused or do not feel safe at home.   This information is not intended to replace advice given to you by your health care provider. Make sure you discuss any questions you have with your health care provider.   Document Released: 01/05/2011 Document Revised: 07/13/2014 Document Reviewed: 05/24/2013 Elsevier Interactive Patient Education [employer].

## 2016-02-11 ENCOUNTER — Telehealth: Payer: Self-pay | Admitting: Family

## 2016-02-11 DIAGNOSIS — R700 Elevated erythrocyte sedimentation rate: Secondary | ICD-10-CM

## 2016-02-11 DIAGNOSIS — R319 Hematuria, unspecified: Secondary | ICD-10-CM

## 2016-02-11 LAB — RHEUMATOID FACTOR: Rhuematoid fact SerPl-aCnc: <10 IU/mL (ref 0.0–13.9)

## 2016-02-12 ENCOUNTER — Ambulatory Visit
Admission: RE | Admit: 2016-02-12 | Discharge: 2016-02-12 | Disposition: A | Discharge status: Home / Self Care | Payer: Federal, State, Local not specified - PPO | Source: Ambulatory Visit | Attending: Obstetrics and Gynecology | Admitting: Obstetrics and Gynecology

## 2016-02-12 ENCOUNTER — Other Ambulatory Visit: Payer: Self-pay | Admitting: Obstetrics and Gynecology

## 2016-02-12 DIAGNOSIS — Z1231 Encounter for screening mammogram for malignant neoplasm of breast: Secondary | ICD-10-CM | POA: Insufficient documentation

## 2016-02-12 DIAGNOSIS — Z01419 Encounter for gynecological examination (general) (routine) without abnormal findings: Secondary | ICD-10-CM | POA: Diagnosis not present

## 2016-02-12 LAB — ANA W/REFLEX IF POSITIVE: Anti Nuclear Antibody(ANA): NEGATIVE

## 2016-02-12 LAB — CYCLIC CITRUL PEPTIDE ANTIBODY, IGG/IGA: CYCLIC CITRULLIN PEPTIDE AB: 4 U (ref 0–19)

## 2016-02-13 LAB — CULTURE, URINE COMPREHENSIVE

## 2016-02-24 ENCOUNTER — Ambulatory Visit (INDEPENDENT_AMBULATORY_CARE_PROVIDER_SITE_OTHER): Payer: Federal, State, Local not specified - PPO | Admitting: Urology

## 2016-02-24 ENCOUNTER — Encounter: Payer: Self-pay | Admitting: Urology

## 2016-02-24 VITALS — BP 139/83 | HR 106 | Ht 61.0 in | Wt 201.8 lb

## 2016-02-24 DIAGNOSIS — R3129 Other microscopic hematuria: Secondary | ICD-10-CM

## 2016-02-24 LAB — URINALYSIS, COMPLETE
Bilirubin, UA: NEGATIVE
Glucose, UA: NEGATIVE
Ketones, UA: NEGATIVE
NITRITE UA: NEGATIVE
PH UA: 7 (ref 5.0–7.5)
Protein, UA: NEGATIVE
Specific Gravity, UA: 1.02 (ref 1.005–1.030)
Urobilinogen, Ur: 1 mg/dL (ref 0.2–1.0)

## 2016-02-24 LAB — MICROSCOPIC EXAMINATION
Epithelial Cells (non renal): >10 /hpf — AB (ref 0–10)
RBC, UA: >30 /hpf — AB (ref 0–?)

## 2016-02-24 NOTE — Progress Notes (Deleted)
y n n y

## 2016-02-24 NOTE — Progress Notes (Signed)
02/24/2016 12:12 PM   AYUMI DIP 07/01/1971 [id number]  Referring provider: Burnard Hawthorne, FNP [address] [place], [place] [address]  Chief Complaint  Patient presents with  . New Patient (Initial Visit)    Hematuria    HPI: Patient is a 45 -year-old Caucasian female who presents today as a referral from their PCP, Burnard Hawthorne, FNP, for microscopic hematuria.  Patient was found to have microscopic hematuria on 02/10/2016 with 3-6 RBC's/hpf.  Patient does have a prior history of microscopic hematuria.  She states she was seen by an urologist at the hospital and had a cystoscopy and RUS and nothing was found.  I do not see any records concerning this in EPIC.    She does not have a prior history of recurrent urinary tract infections, nephrolithiasis, trauma to the genitourinary tract or malignancies of the genitourinary tract.   She does not have a family medical history of nephrolithiasis, malignancies of the genitourinary tract or hematuria.  Today, she is having symptoms of frequent urination and nocturia.  Her UA today demonstrates > 30 RBC's/hpf.  She is not experiencing any suprapubic pain, abdominal pain or flank pain.  She denies any recent fevers, chills, nausea or vomiting.   She has not had any recent imaging studies.   She is not a smoker. She is not exposed to secondhand smoke.  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    PMH: Past Medical History:  Diagnosis Date  . Allergy   . GERD (gastroesophageal reflux disease)   . Leg swelling   . Obesity   . PMDD (premenstrual dysphoric disorder)   . Vitamin D deficiency     Surgical History: Past Surgical History:  Procedure Laterality Date  . TONSILLECTOMY      Home Medications:    Medication List       Accurate as of 02/24/16 12:12 PM. Always use your most recent med list.          levonorgestrel-ethinyl estradiol 0.1-20 MG-MCG tablet Commonly known as:   AVIANE Take 1 tablet by mouth daily.   triamcinolone 0.1 % cream : eucerin Crea Apply 1 application topically 2 (two) times daily.   Vitamin D (Ergocalciferol) 50000 units Caps capsule Commonly known as:  DRISDOL Take 1 capsule (50,000 Units total) by mouth every 7 (seven) days.       Allergies:  Allergies  Allergen Reactions  . Penicillins     Family History: Family History  Problem Relation Age of Onset  . Arthritis Mother   . Raynaud syndrome Mother   . Arthritis Father   . Breast cancer Neg Hx   . Bladder Cancer Neg Hx   . Kidney cancer Neg Hx     Social History:  reports that she has never smoked. She has never used smokeless tobacco. She reports that she does not drink alcohol or use drugs.  ROS: UROLOGY Frequent Urination?: Yes Hard to postpone urination?: No Burning/pain with urination?: No Get up at night to urinate?: Yes Leakage of urine?: No Urine stream starts and stops?: No Trouble starting stream?: No Do you have to strain to urinate?: No Blood in urine?: Yes Urinary tract infection?: No Sexually transmitted disease?: No Injury to kidneys or bladder?: No Painful intercourse?: No Weak stream?: No Currently pregnant?: No Vaginal bleeding?: No Last menstrual period?: n  Gastrointestinal Nausea?: No Vomiting?: No Indigestion/heartburn?: No Diarrhea?: No Constipation?: No  Constitutional Fever: No Night sweats?: Yes Weight loss?: No  Fatigue?: No  Skin Skin rash/lesions?: Yes Itching?: Yes  Eyes Blurred vision?: No Double vision?: No  Ears/Nose/Throat Sore throat?: No Sinus problems?: No  Hematologic/Lymphatic Swollen glands?: No Easy bruising?: No  Cardiovascular Leg swelling?: No Chest pain?: No  Respiratory Cough?: No Shortness of breath?: No  Endocrine Excessive thirst?: No  Musculoskeletal Back pain?: No Joint pain?: No  Neurological Headaches?: No Dizziness?: No  Psychologic Depression?: No Anxiety?:  No  Physical Exam: BP 139/83 (BP Location: Left Arm, Patient Position: Sitting, Cuff Size: Large)   Pulse (!) 106   Ht 5\' 1"  (1.549 m)   Wt 201 lb 12.8 oz (91.5 kg)   LMP  (LMP Unknown)   BMI 38.13 kg/m   Constitutional: Well nourished. Alert and oriented, No acute distress. HEENT: [place] AT, moist mucus membranes. Trachea midline, no masses. Cardiovascular: No clubbing, cyanosis, or edema. Respiratory: Normal respiratory effort, no increased work of breathing. GI: Abdomen is soft, non tender, non distended, no abdominal masses. Liver and spleen not palpable.  No hernias appreciated.  Stool sample for occult testing is not indicated.   GU: No CVA tenderness.  No bladder fullness or masses.  Normal external genitalia, normal pubic hair distribution, no lesions.  Normal urethral meatus, no lesions, no prolapse, no discharge.   No urethral masses, tenderness and/or tenderness. No bladder fullness, tenderness or masses. Normal vagina mucosa, good estrogen effect, no discharge, no lesions, good pelvic support, no cystocele or rectocele noted.  No cervical motion tenderness.  Uterus is freely mobile and non-fixed.  No adnexal/parametria masses or tenderness noted.  Anus and perineum are without rashes or lesions.    Skin: No rashes, bruises or suspicious lesions. Lymph: No cervical or inguinal adenopathy. Neurologic: Grossly intact, no focal deficits, moving all 4 extremities. Psychiatric: Normal mood and affect.  Laboratory Data:  Lab Results  Component Value Date   CREATININE 0.76 10/24/2015    Lab Results  Component Value Date   HGBA1C 6.1 (H) 10/24/2015      Component Value Date/Time   CHOL 178 10/24/2015 1104   HDL 38 (L) 10/24/2015 1104   CHOLHDL 4.7 (H) 10/24/2015 1104   LDLCALC 115 (H) 10/24/2015 1104    Lab Results  Component Value Date   AST 12 10/24/2015   Lab Results  Component Value Date   ALT 14 10/24/2015     Urinalysis Greater than 30 RBC's/hpf.  See EPIC.     Assessment & Plan:   1. Microscopic hematuria   I explained to the patient that there are a number of causes that can be associated with blood in the urine, such as stones, UTI's, damage to the urinary tract and/or cancer.  At this time, I felt that the patient warranted further urologic evaluation.   The [facility] guidelines state that a CT urogram is the preferred imaging study to evaluate hematuria.  I explained to the patient that a contrast material will be injected into a vein and that in rare instances, an allergic reaction can result and may even life threatening   The patient denies any allergies to contrast, iodine and/or seafood and is not taking metformin.  Her reproductive status is unknown at this time. We will obtain a serum pregnancy test today.   Following the imaging study,  I've recommended a cystoscopy. I described how this is performed, typically in an office setting with a flexible cystoscope. We described the risks, benefits, and possible side effects, the most common of which is a minor amount  of blood in the urine and/or burning which usually resolves in 24 to 48 hours.    The patient had the opportunity to ask questions which were answered. Based upon this discussion, the patient is willing to proceed with the CT scan, but she has reservations concerning the cystoscopy.  I stated that we would go ahead and schedule the CT scan and she can discuss the cystoscopy with the physician.  Therefore, I've ordered: a CT Urogram and cystoscopy.  She will return following all of the above for discussion of the results.     - Urinalysis, Complete - CULTURE, URINE COMPREHENSIVE - BUN+Creat - hCG   Return for CT scan report and cystoscopy.  These notes generated with voice recognition software. I apologize for typographical errors.  Zara Council, [facility] [address] [place], [place] [address] [phone]

## 2016-02-25 LAB — BUN+CREAT
BUN/Creatinine Ratio: 18 (ref 9–23)
BUN: 12 mg/dL (ref 6–24)
CREATININE: 0.67 mg/dL (ref 0.57–1.00)
GFR calc Af Amer: 122 mL/min/{1.73_m2} (ref >59)
GFR, EST NON AFRICAN AMERICAN: 106 mL/min/{1.73_m2} (ref >59)

## 2016-02-25 LAB — HCG, SERUM, QUALITATIVE: HCG, BETA SUBUNIT, QUAL, SERUM: NEGATIVE m[IU]/mL (ref <6)

## 2016-02-27 DIAGNOSIS — F341 Dysthymic disorder: Secondary | ICD-10-CM | POA: Diagnosis not present

## 2016-02-27 LAB — CULTURE, URINE COMPREHENSIVE

## 2016-03-04 ENCOUNTER — Ambulatory Visit
Admission: RE | Admit: 2016-03-04 | Discharge: 2016-03-04 | Disposition: A | Discharge status: Home / Self Care | Payer: Federal, State, Local not specified - PPO | Source: Ambulatory Visit | Attending: Urology | Admitting: Urology

## 2016-03-04 DIAGNOSIS — N200 Calculus of kidney: Secondary | ICD-10-CM | POA: Diagnosis not present

## 2016-03-04 DIAGNOSIS — N2889 Other specified disorders of kidney and ureter: Secondary | ICD-10-CM | POA: Insufficient documentation

## 2016-03-04 DIAGNOSIS — R700 Elevated erythrocyte sedimentation rate: Secondary | ICD-10-CM | POA: Diagnosis not present

## 2016-03-04 DIAGNOSIS — R3129 Other microscopic hematuria: Secondary | ICD-10-CM | POA: Insufficient documentation

## 2016-03-04 DIAGNOSIS — E669 Obesity, unspecified: Secondary | ICD-10-CM | POA: Diagnosis not present

## 2016-03-04 MED ORDER — IOPAMIDOL (ISOVUE-300) INJECTION 61%
125.0000 mL | Freq: Once | INTRAVENOUS | Status: AC | PRN
  Administered 2016-03-04: 125 mL via INTRAVENOUS

## 2016-03-06 ENCOUNTER — Telehealth: Payer: Self-pay

## 2016-03-06 NOTE — Telephone Encounter (Signed)
Pt called requesting CT results. Reinforced with pt results typically are not given over the phone. Pt voiced understanding. Pt then stated that she is looking for a "peace of mind" due to cysto not appt until 03/20/16. Please advise.  Made pt aware Larene Beach is out of the office this week and [facility] will be closed Monday. Pt voiced understanding.

## 2016-03-10 DIAGNOSIS — F411 Generalized anxiety disorder: Secondary | ICD-10-CM | POA: Diagnosis not present

## 2016-03-10 NOTE — Telephone Encounter (Signed)
She has very small kidney stones found on her CT.  It is still important that she keeps her cystoscopy appointment as the CT scan cannot evaluate for bladder cancer.

## 2016-03-11 NOTE — Telephone Encounter (Signed)
Spoke with pt in reference to CT results. Pt voiced understanding.  

## 2016-03-11 NOTE — Telephone Encounter (Signed)
LMOM

## 2016-03-18 ENCOUNTER — Telehealth: Payer: Self-pay | Admitting: *Deleted

## 2016-03-18 NOTE — Telephone Encounter (Signed)
Please call patient with good news-  CT's are not like MRIs.   I'm a little confused as well. CT order was written by urology and it look like it was done 8/30. If she has another CT from urology in the future, they would need to prescribe something.

## 2016-03-18 NOTE — Telephone Encounter (Signed)
Please advise 

## 2016-03-18 NOTE — Telephone Encounter (Signed)
Attempted to reach patient, left a VM  

## 2016-03-18 NOTE — Telephone Encounter (Signed)
Patient stated that she will be having a cystoscopy this Friday, she stated that it was in her write up to have this testing if needed, urology advised that it was needed.

## 2016-03-18 NOTE — Telephone Encounter (Signed)
Patient requested to have a dose of medication to take the day of her CT scan on 09/15. She requested a low dose, to help with anxiety. Pt contact [phone]

## 2016-03-19 NOTE — Telephone Encounter (Signed)
Please call patient  Please advised patient that I cannot order any anti-anxiety medication for an exam that I did not order. She needs to call her urologist if she needs something for anxiety for test which they have ordered.  I don't mean to be difficult- just want to be safe.

## 2016-03-19 NOTE — Telephone Encounter (Signed)
Attempted to reach patient, left a detailed message with NP's message, thanks

## 2016-03-19 NOTE — Telephone Encounter (Signed)
Please advise 

## 2016-03-20 ENCOUNTER — Ambulatory Visit (INDEPENDENT_AMBULATORY_CARE_PROVIDER_SITE_OTHER): Payer: Federal, State, Local not specified - PPO | Admitting: Urology

## 2016-03-20 VITALS — BP 147/85 | HR 118 | Ht 61.0 in | Wt 201.0 lb

## 2016-03-20 DIAGNOSIS — R3129 Other microscopic hematuria: Secondary | ICD-10-CM

## 2016-03-20 DIAGNOSIS — N3281 Overactive bladder: Secondary | ICD-10-CM

## 2016-03-20 DIAGNOSIS — N393 Stress incontinence (female) (male): Secondary | ICD-10-CM

## 2016-03-20 LAB — MICROSCOPIC EXAMINATION

## 2016-03-20 LAB — URINALYSIS, COMPLETE
Bilirubin, UA: NEGATIVE
GLUCOSE, UA: NEGATIVE
Ketones, UA: NEGATIVE
LEUKOCYTES UA: NEGATIVE
Nitrite, UA: NEGATIVE
PROTEIN UA: NEGATIVE
Specific Gravity, UA: 1.02 (ref 1.005–1.030)
Urobilinogen, Ur: 0.2 mg/dL (ref 0.2–1.0)
pH, UA: 5.5 (ref 5.0–7.5)

## 2016-03-20 MED ORDER — CIPROFLOXACIN HCL 500 MG PO TABS
500.0000 mg | ORAL_TABLET | Freq: Once | ORAL | Status: AC
  Administered 2016-03-20: 500 mg via ORAL

## 2016-03-20 MED ORDER — LIDOCAINE HCL 2 % EX GEL
1.0000 "application " | Freq: Once | CUTANEOUS | Status: AC
  Administered 2016-03-20: 1 via URETHRAL

## 2016-03-20 NOTE — Progress Notes (Addendum)
03/20/2016 10:44 AM   Sierra Moore [birth year] [id number]  Referring provider: Burnard Hawthorne, FNP [address] [place], [place] [address]  Chief Complaint  Patient presents with  . Cysto    HPI: 45 yo female who presents today for completion of her microscopic hematuria workup. She underwent CT urogram which was fairly unremarkable. This did show an incidental 1 mm right UVJ artifact but otherwise was unremarkable. She presents today for cystoscopy to complete her workup.  She is a former smoker, smoked less than a pack a day 10 years. She is a current nonsmoker.  Today, she has no urinary complaints other than urinary frequency and mild SUI.  She is prediabetic.     PMH: Past Medical History:  Diagnosis Date  . Allergy   . GERD (gastroesophageal reflux disease)   . Leg swelling   . Obesity   . PMDD (premenstrual dysphoric disorder)   . Vitamin D deficiency     Surgical History: Past Surgical History:  Procedure Laterality Date  . TONSILLECTOMY      Home Medications:    Medication List       Accurate as of 03/20/16 10:44 AM. Always use your most recent med list.          levonorgestrel-ethinyl estradiol 0.1-20 MG-MCG tablet Commonly known as:  AVIANE Take 1 tablet by mouth daily.   triamcinolone 0.1 % cream : eucerin Crea Apply 1 application topically 2 (two) times daily.       Allergies:  Allergies  Allergen Reactions  . Penicillins     Family History: Family History  Problem Relation Age of Onset  . Arthritis Mother   . Raynaud syndrome Mother   . Arthritis Father   . Breast cancer Neg Hx   . Bladder Cancer Neg Hx   . Kidney cancer Neg Hx     Social History:  reports that she has never smoked. She has never used smokeless tobacco. She reports that she does not drink alcohol or use drugs.  Physical Exam: BP (!) 147/85   Pulse (!) 118   Ht 5\' 1"  (1.549 m)   Wt 201 lb (91.2 kg)   LMP  (LMP Unknown) Comment:  Negative HCG Beta Preg test 02/24/2016.  BMI 37.98 kg/m   Constitutional:  Alert and oriented, No acute distress. HEENT: [place] AT, moist mucus membranes.  Trachea midline, no masses. Cardiovascular: No clubbing, cyanosis, or edema. Respiratory: Normal respiratory effort, no increased work of breathing. GI: Abdomen is soft, nontender, nondistended, no abdominal masses GU: normal external genitalia. Normal urethral meatus. Skin: No rashes, bruises or suspicious lesions.. Neurologic: Grossly intact, no focal deficits, moving all 4 extremities. Psychiatric: Normal mood and affect.  Laboratory Data: Lab Results  Component Value Date   CREATININE 0.67 02/24/2016     Lab Results  Component Value Date   HGBA1C 6.1 (H) 10/24/2015    Urinalysis UA reviewed, see epic  Pertinent Imaging: CLINICAL DATA:  Microscopic hematuria found on 02/10/2016, asymptomatic.  EXAM: CT ABDOMEN AND PELVIS WITHOUT AND WITH CONTRAST  TECHNIQUE: Multidetector CT imaging of the abdomen and pelvis was performed following the standard protocol before and following the bolus administration of intravenous contrast.  CONTRAST:  169mL ISOVUE-300 IOPAMIDOL (ISOVUE-300) INJECTION 61%  COMPARISON:  None.  FINDINGS: Lower chest: Lung bases show no acute findings. Heart size normal. No pericardial or pleural effusion. Distal esophagus is grossly unremarkable.  Hepatobiliary: Liver and gallbladder are unremarkable. No biliary ductal dilatation.  Pancreas:  Negative.  Spleen: Negative.  Adrenals/Urinary Tract: Adrenal glands and right kidney are unremarkable. There may be punctate stones in the left kidney. Probable 1 mm high density artifact at the right ureterovesical junction (series 2, image 75). Difficult to exclude a tiny stone. No hydronephrosis. Left ureter is decompressed. Bladder is unremarkable.  Stomach/Bowel: Stomach, small bowel, appendix and colon  are unremarkable.  Vascular/Lymphatic: Vascular structures are unremarkable. No pathologically enlarged lymph nodes.  Reproductive: Uterus is visualized.  No adnexal mass.  Other: No free fluid.  Mesenteries and peritoneum are unremarkable.  Musculoskeletal: No worrisome lytic or sclerotic lesions.  IMPRESSION: 1. 1 mm high density structure at the right ureterovesical junction. Artifact is favored. Difficult to definitively exclude a punctate stone. No hydronephrosis. 2. Punctate stones in the left kidney.   Electronically Signed   By: Lorin Picket M.D.   On: 03/04/2016 13:45  CT scan personally reviewed today and with the patient.    Cystoscopy Procedure Note  Patient identification was confirmed, informed consent was obtained, and patient was prepped using Betadine solution.  Lidocaine jelly was administered per urethral meatus.    Preoperative abx where received prior to procedure.    Procedure: - Flexible cystoscope introduced, without any difficulty.   - Thorough search of the bladder revealed:    normal urethral meatus    normal urothelium    no stones    no ulcers     no tumors    no urethral polyps    no trabeculation  - Ureteral orifices were normal in position and appearance.  Post-Procedure: - Patient tolerated the procedure well   Assessment & Plan:    1. Microscopic hematuria S/p negative work up including cysto/ CTU CT urogram reviewed with the patient today. She has no flank pain or any other reason to think that she would have a right UVJ stone. As such, I feel that the 1 mm defect within the right UVJ is likely an artifact as stated by radiology - Urinalysis, Complete - ciprofloxacin (CIPRO) tablet 500 mg; Take 1 tablet (500 mg total) by mouth once. - lidocaine (XYLOCAINE) 2 % jelly 1 application; Place 1 application into the urethra once.  2. OAB Discussed behavioral modification today Offered med but not interested  3.  SUI Mild, discussed weight loss and Kegels   Return for as needed (recommned repeeat cysto in 3-5 years if persistent).  Hollice Espy, MD  [facility] [address] [place], [place] [address] [phone]

## 2016-03-23 NOTE — Telephone Encounter (Signed)
close

## 2016-03-24 DIAGNOSIS — F411 Generalized anxiety disorder: Secondary | ICD-10-CM | POA: Diagnosis not present

## 2016-03-27 ENCOUNTER — Telehealth: Payer: Self-pay | Admitting: Obstetrics and Gynecology

## 2016-03-27 ENCOUNTER — Other Ambulatory Visit: Payer: Self-pay | Admitting: *Deleted

## 2016-03-27 MED ORDER — LEVONORGESTREL-ETHINYL ESTRAD 0.1-20 MG-MCG PO TABS: 1.0000 | ORAL_TABLET | Freq: Every day | ORAL | 4 refills | Status: DC

## 2016-03-27 NOTE — Telephone Encounter (Signed)
BC pills need to be refilled. She needs something in writing that she takes them continuosly. They wont let her get them filled because it wasn't written that way. She needs them this weekend

## 2016-03-27 NOTE — Telephone Encounter (Signed)
Done-ac 

## 2016-04-07 DIAGNOSIS — F341 Dysthymic disorder: Secondary | ICD-10-CM | POA: Diagnosis not present

## 2016-04-21 DIAGNOSIS — F341 Dysthymic disorder: Secondary | ICD-10-CM | POA: Diagnosis not present

## 2016-05-07 DIAGNOSIS — F341 Dysthymic disorder: Secondary | ICD-10-CM | POA: Diagnosis not present

## 2016-05-21 DIAGNOSIS — F341 Dysthymic disorder: Secondary | ICD-10-CM | POA: Diagnosis not present

## 2016-06-09 DIAGNOSIS — F341 Dysthymic disorder: Secondary | ICD-10-CM | POA: Diagnosis not present

## 2016-07-16 DIAGNOSIS — F341 Dysthymic disorder: Secondary | ICD-10-CM | POA: Diagnosis not present

## 2016-08-20 DIAGNOSIS — F341 Dysthymic disorder: Secondary | ICD-10-CM | POA: Diagnosis not present

## 2016-09-08 DIAGNOSIS — F341 Dysthymic disorder: Secondary | ICD-10-CM | POA: Diagnosis not present

## 2016-09-22 DIAGNOSIS — F341 Dysthymic disorder: Secondary | ICD-10-CM | POA: Diagnosis not present

## 2016-09-25 ENCOUNTER — Ambulatory Visit (INDEPENDENT_AMBULATORY_CARE_PROVIDER_SITE_OTHER): Payer: Federal, State, Local not specified - PPO | Admitting: Family Medicine

## 2016-09-25 ENCOUNTER — Encounter: Payer: Self-pay | Admitting: Family Medicine

## 2016-09-25 VITALS — BP 142/80 | HR 104 | Temp 97.7°F | Ht 61.0 in | Wt 202.8 lb

## 2016-09-25 DIAGNOSIS — N3946 Mixed incontinence: Secondary | ICD-10-CM | POA: Diagnosis not present

## 2016-09-25 DIAGNOSIS — R300 Dysuria: Secondary | ICD-10-CM | POA: Diagnosis not present

## 2016-09-25 LAB — POCT URINALYSIS DIPSTICK
Bilirubin, UA: NEGATIVE
Glucose, UA: NEGATIVE
NITRITE UA: NEGATIVE
PH UA: 6 (ref 5.0–8.0)
Protein, UA: 30
Spec Grav, UA: 1.02 (ref 1.030–1.035)
UROBILINOGEN UA: 1 (ref ?–2.0)

## 2016-09-25 MED ORDER — MIRABEGRON ER 25 MG PO TB24: 25.0000 mg | ORAL_TABLET | Freq: Every day | ORAL | 1 refills | Status: DC

## 2016-09-25 NOTE — Progress Notes (Signed)
Subjective:  Patient ID: Sierra Moore, female    DOB: 22-Aug-1970  Age: 46 y.o. MRN: [id number]  CC: Dysuria  HPI:  46 year old female with a history of UTI and hematuria presents with dysuria.  Patient reports that she has recently developed burning after urination. She endorses frequency and urgency but states that this is her baseline. She has a history of hematuria. She has seen urology and had a negative cystoscopy. CT revealed punctate stones in the kidney. Otherwise unremarkable. No recent fever or chills. No known exacerbating or relieving factors.  Additionally, patient endorses incontinence. She states that she has incontinence with bending over, coughing, sneezing. Patient also endorses some symptoms of overactive bladder. She states that she urinates frequently during the day and has associated urgency.   Social Hx   Social History   Social History  . Marital status: Married    Spouse name: N/A  . Number of children: N/A  . Years of education: N/A   Social History Main Topics  . Smoking status: Never Smoker  . Smokeless tobacco: Never Used  . Alcohol use No  . Drug use: No  . Sexual activity: Yes   Other Topics Concern  . None   Social History Narrative   Married   Works as Radio producer, Optician, dispensing in [employer]   Daughter 77, senior in [place].    Exercise: Dance   Diet: Has tried a low carb diet. Working on [employer].     Review of Systems  Constitutional: Negative.   Genitourinary: Positive for dysuria, frequency and urgency.   Objective:  BP (!) 142/80   Pulse (!) 104   Temp 97.7 F (36.5 C) (Oral)   Ht 5\' 1"  (1.549 m)   Wt 202 lb 12.8 oz (92 kg)   SpO2 98%   BMI 38.32 kg/m   BP/Weight 09/25/2016 03/20/2016 8/36/6294  Systolic BP 765 465 035  Diastolic BP 80 85 83  Wt. (Lbs) 202.8 201 201.8  BMI 38.32 37.98 38.13    Physical Exam  Constitutional: She is oriented to person, place, and time. She appears  well-developed. No distress.  HENT:  Head: Normocephalic and atraumatic.  Pulmonary/Chest: Effort normal.  Neurological: She is alert and oriented to person, place, and time.  Psychiatric: She has a normal mood and affect.  Vitals reviewed.   Lab Results  Component Value Date   GLUCOSE 103 (H) 10/24/2015   CHOL 178 10/24/2015   TRIG 127 10/24/2015   HDL 38 (L) 10/24/2015   LDLCALC 115 (H) 10/24/2015   ALT 14 10/24/2015   AST 12 10/24/2015   NA 138 10/24/2015   K 4.2 10/24/2015   CL 100 10/24/2015   CREATININE 0.67 02/24/2016   BUN 12 02/24/2016   CO2 20 10/24/2015   HGBA1C 6.1 (H) 10/24/2015    Assessment & Plan:   Problem List Items Addressed This Visit    Dysuria - Primary    New acute problem. Moderate blood and small leukocytes. Has chronic hematuria. Will await culture.      Relevant Orders   POCT Urinalysis Dipstick (Completed)   Urine Culture   Mixed stress and urge urinary incontinence    New problem. Trial of Myrbetriq.      Relevant Medications   mirabegron ER (MYRBETRIQ) 25 MG TB24 tablet      Meds ordered this encounter  Medications  . mirabegron ER (MYRBETRIQ) 25 MG TB24 tablet    Sig: Take 1  tablet (25 mg total) by mouth daily.    Dispense:  30 tablet    Refill:  1    Follow-up: PRN  [place]

## 2016-09-25 NOTE — Assessment & Plan Note (Signed)
New acute problem. Moderate blood and small leukocytes. Has chronic hematuria. Will await culture.

## 2016-09-25 NOTE — Assessment & Plan Note (Signed)
New problem. Trial of Myrbetriq.

## 2016-09-25 NOTE — Patient Instructions (Signed)
Medication as prescribed.  We will call with the culture results.  Consider seeing Dr. McDiarmid - [facility].  Take care  Dr. Lacinda Axon

## 2016-09-25 NOTE — Progress Notes (Signed)
Pre visit review using our clinic review tool, if applicable. No additional management support is needed unless otherwise documented below in the visit note. 

## 2016-09-27 LAB — URINE CULTURE

## 2016-10-08 DIAGNOSIS — F341 Dysthymic disorder: Secondary | ICD-10-CM | POA: Diagnosis not present

## 2016-10-22 DIAGNOSIS — F341 Dysthymic disorder: Secondary | ICD-10-CM | POA: Diagnosis not present

## 2016-10-28 DIAGNOSIS — B351 Tinea unguium: Secondary | ICD-10-CM | POA: Diagnosis not present

## 2016-10-28 DIAGNOSIS — C44519 Basal cell carcinoma of skin of other part of trunk: Secondary | ICD-10-CM | POA: Diagnosis not present

## 2016-10-28 DIAGNOSIS — L573 Poikiloderma of Civatte: Secondary | ICD-10-CM | POA: Diagnosis not present

## 2016-10-28 DIAGNOSIS — D485 Neoplasm of uncertain behavior of skin: Secondary | ICD-10-CM | POA: Diagnosis not present

## 2016-11-10 DIAGNOSIS — F341 Dysthymic disorder: Secondary | ICD-10-CM | POA: Diagnosis not present

## 2016-11-12 ENCOUNTER — Encounter: Payer: Self-pay | Admitting: Obstetrics and Gynecology

## 2016-11-12 ENCOUNTER — Ambulatory Visit (INDEPENDENT_AMBULATORY_CARE_PROVIDER_SITE_OTHER): Payer: Federal, State, Local not specified - PPO | Admitting: Obstetrics and Gynecology

## 2016-11-12 VITALS — BP 137/92 | HR 87 | Ht 61.0 in | Wt 202.0 lb

## 2016-11-12 DIAGNOSIS — Z01419 Encounter for gynecological examination (general) (routine) without abnormal findings: Secondary | ICD-10-CM | POA: Diagnosis not present

## 2016-11-12 DIAGNOSIS — E559 Vitamin D deficiency, unspecified: Secondary | ICD-10-CM | POA: Diagnosis not present

## 2016-11-12 MED ORDER — TERCONAZOLE 0.4 % VA CREA: 1.0000 | TOPICAL_CREAM | Freq: Every day | VAGINAL | 0 refills | Status: DC

## 2016-11-12 MED ORDER — LEVONORGESTREL-ETHINYL ESTRAD 0.1-20 MG-MCG PO TABS: 1.0000 | ORAL_TABLET | Freq: Every day | ORAL | 4 refills | Status: DC

## 2016-11-12 NOTE — Progress Notes (Signed)
Subjective:   Sierra Moore is a 46 y.o. G12P0 Caucasian female here for a routine well-woman exam.  No LMP recorded. Patient is not currently having periods (Reason: Oral contraceptives).   Had bladder scan to r/o cancer and it was negative. Current complaints: never started OAB meds-fearful of side-effects, no change in bladder symptoms. Occasional breast pains if caffeine intake increases. Recurrent rash under breast in the summer. Exercising regularly.  PCP: [facility]       Does need labs  Social History: Sexual: heterosexual Marital Status: married Living situation: with family Occupation: PT Medical laboratory scientific officer Tobacco/alcohol: no tobacco use Illicit drugs: no history of illicit drug use  The following portions of the patient's history were reviewed and updated as appropriate: allergies, current medications, past family history, past medical history, past social history, past surgical history and problem list.  Past Medical History Past Medical History:  Diagnosis Date  . Allergy   . GERD (gastroesophageal reflux disease)   . Leg swelling   . Obesity   . PMDD (premenstrual dysphoric disorder)   . Vitamin D deficiency     Past Surgical History Past Surgical History:  Procedure Laterality Date  . TONSILLECTOMY      Gynecologic History G2P0  No LMP recorded. Patient is not currently having periods (Reason: Oral contraceptives). Contraception: OCP (estrogen/progesterone) Last Pap: 2017. Results were: normal Last mammogram: 02/2016. Results were: normal  Obstetric History OB History  Gravida Para Term Preterm AB Living  2         2  SAB TAB Ectopic Multiple Live Births          2    # Outcome Date GA Lbr Len/2nd Weight Sex Delivery Anes PTL Lv  2 Gravida 2000    F Vag-Spont   LIV  1 Gravida 1997    M Vag-Spont   LIV      Current Medications Current Outpatient Prescriptions on File Prior to Visit  Medication Sig Dispense Refill  . levonorgestrel-ethinyl estradiol  (AVIANE) 0.1-20 MG-MCG tablet Take 1 tablet by mouth daily. 3 Package 4  . mirabegron ER (MYRBETRIQ) 25 MG TB24 tablet Take 1 tablet (25 mg total) by mouth daily. (Patient not taking: Reported on 11/12/2016) 30 tablet 1  . Triamcinolone Acetonide (TRIAMCINOLONE 0.1 % CREAM : EUCERIN) CREA Apply 1 application topically 2 (two) times daily. (Patient not taking: Reported on 11/12/2016) 1 each 0   No current facility-administered medications on file prior to visit.     Review of Systems Patient denies any headaches, blurred vision, shortness of breath, chest pain, abdominal pain, problems with bowel movements, urination, or intercourse.  Objective:  BP (!) 137/92   Pulse 87   Ht 5\' 1"  (1.549 m)   Wt 202 lb (91.6 kg)   BMI 38.17 kg/m  Physical Exam  General:  Well developed, well nourished, no acute distress. She is alert and oriented x3. Skin:  Warm and dry Neck:  Midline trachea, no thyromegaly or nodules Cardiovascular: Regular rate and rhythm, no murmur heard Lungs:  Effort normal, all lung fields clear to auscultation bilaterally Breasts:  No dominant palpable mass, retraction, or nipple discharge Abdomen:  Soft, non tender, no hepatosplenomegaly or masses Pelvic:  External genitalia is normal in appearance except redness throughout.  The vagina is normal in appearance with thin white discharge throughout. The cervix is bulbous, no CMT.  Thin prep pap is not done. Uterus is felt to be normal size, shape, and contour.  No adnexal masses or  tenderness noted.Microscopic wet-mount exam shows negative for pathogens, normal epithelial cells, lactobacilli. Extremities:  No swelling or varicosities noted Psych:  She has a normal mood and affect  Assessment:   Healthy well-woman exam OAB Obesity Chronic hematuria Pre-diabetes Yeast infection  Plan:  Will treat yeast infection with terazol F/U 1 year for AE, or sooner if needed Mammogram ordered Melody Rockney Ghee, CNM

## 2016-11-13 LAB — LIPID PANEL
CHOLESTEROL TOTAL: 185 mg/dL (ref 100–199)
Chol/HDL Ratio: 4.7 ratio — ABNORMAL HIGH (ref 0.0–4.4)
HDL: 39 mg/dL — ABNORMAL LOW (ref >39)
LDL Calculated: 125 mg/dL — ABNORMAL HIGH (ref 0–99)
Triglycerides: 105 mg/dL (ref 0–149)
VLDL Cholesterol Cal: 21 mg/dL (ref 5–40)

## 2016-11-13 LAB — COMPREHENSIVE METABOLIC PANEL
ALBUMIN: 4 g/dL (ref 3.5–5.5)
ALK PHOS: 107 IU/L (ref 39–117)
ALT: 8 IU/L (ref 0–32)
AST: 13 IU/L (ref 0–40)
Albumin/Globulin Ratio: 1.5 (ref 1.2–2.2)
BUN / CREAT RATIO: 15 (ref 9–23)
BUN: 11 mg/dL (ref 6–24)
Bilirubin Total: 0.3 mg/dL (ref 0.0–1.2)
CALCIUM: 9.1 mg/dL (ref 8.7–10.2)
CO2: 23 mmol/L (ref 18–29)
CREATININE: 0.72 mg/dL (ref 0.57–1.00)
Chloride: 100 mmol/L (ref 96–106)
GFR calc Af Amer: 115 mL/min/{1.73_m2} (ref >59)
GFR, EST NON AFRICAN AMERICAN: 100 mL/min/{1.73_m2} (ref >59)
GLUCOSE: 94 mg/dL (ref 65–99)
Globulin, Total: 2.7 g/dL (ref 1.5–4.5)
Potassium: 5.2 mmol/L (ref 3.5–5.2)
Sodium: 138 mmol/L (ref 134–144)
TOTAL PROTEIN: 6.7 g/dL (ref 6.0–8.5)

## 2016-11-13 LAB — HEMOGLOBIN A1C
Est. average glucose Bld gHb Est-mCnc: 134 mg/dL
HEMOGLOBIN A1C: 6.3 % — AB (ref 4.8–5.6)

## 2016-11-13 LAB — VITAMIN D 25 HYDROXY (VIT D DEFICIENCY, FRACTURES): Vit D, 25-Hydroxy: 22.7 ng/mL — ABNORMAL LOW (ref 30.0–100.0)

## 2016-12-01 DIAGNOSIS — F341 Dysthymic disorder: Secondary | ICD-10-CM | POA: Diagnosis not present

## 2016-12-04 ENCOUNTER — Other Ambulatory Visit: Payer: Self-pay | Admitting: Obstetrics and Gynecology

## 2016-12-04 MED ORDER — VITAMIN D (ERGOCALCIFEROL) 1.25 MG (50000 UNIT) PO CAPS: 50000.0000 [IU] | ORAL_CAPSULE | ORAL | 1 refills | Status: DC

## 2016-12-04 MED ORDER — METFORMIN HCL 500 MG PO TABS: 500.0000 mg | ORAL_TABLET | Freq: Two times a day (BID) | ORAL | 3 refills | Status: DC

## 2016-12-07 DIAGNOSIS — C44519 Basal cell carcinoma of skin of other part of trunk: Secondary | ICD-10-CM | POA: Diagnosis not present

## 2016-12-15 DIAGNOSIS — F341 Dysthymic disorder: Secondary | ICD-10-CM | POA: Diagnosis not present

## 2016-12-31 DIAGNOSIS — F341 Dysthymic disorder: Secondary | ICD-10-CM | POA: Diagnosis not present

## 2017-01-11 ENCOUNTER — Ambulatory Visit (INDEPENDENT_AMBULATORY_CARE_PROVIDER_SITE_OTHER): Payer: Federal, State, Local not specified - PPO | Admitting: Family

## 2017-01-11 ENCOUNTER — Encounter: Payer: Self-pay | Admitting: Family

## 2017-01-11 DIAGNOSIS — R319 Hematuria, unspecified: Secondary | ICD-10-CM | POA: Diagnosis not present

## 2017-01-11 DIAGNOSIS — R7303 Prediabetes: Secondary | ICD-10-CM

## 2017-01-11 DIAGNOSIS — N3946 Mixed incontinence: Secondary | ICD-10-CM

## 2017-01-11 NOTE — Progress Notes (Signed)
Pre visit review using our clinic review tool, if applicable. No additional management support is needed unless otherwise documented below in the visit note. 

## 2017-01-11 NOTE — Assessment & Plan Note (Signed)
a1c 6.3. Patient prefers lifestyle changes which I supported. We will regroup in 3 months and repeat a1c.

## 2017-01-11 NOTE — Progress Notes (Signed)
Subjective:    Patient ID: Sierra Moore, female    DOB: 1971-03-11, 46 y.o.   MRN: [id number]  CC: Sierra Moore is a 46 y.o. female who presents today for follow up.   HPI: Prediabetes- CNM wanted her to start metformin however she would like to forego medication. Has been positive food changes at home. Eating less carbs. Trying to exercise more  Follows with melody, cnm  Hematuria- Dr Erlene Quan; had had cytoscope normal per patient since last visit. CT abdomen and pelvis and no stones seen. Advised to come back in 2-3 years due to prior h/o smoking.  Left kidney stone, otherwise unremarkable.   OAB- endorses urinary urgency, urinary incontinence with coughing, laughing; not taking mybretriq.     HISTORY:  Past Medical History:  Diagnosis Date  . Allergy   . GERD (gastroesophageal reflux disease)   . Leg swelling   . Obesity   . PMDD (premenstrual dysphoric disorder)   . Vitamin D deficiency    Past Surgical History:  Procedure Laterality Date  . TONSILLECTOMY     Family History  Problem Relation Age of Onset  . Arthritis Mother   . Raynaud syndrome Mother   . Arthritis Father   . Breast cancer Neg Hx   . Bladder Cancer Neg Hx   . Kidney cancer Neg Hx     Allergies: Penicillins Current Outpatient Prescriptions on File Prior to Visit  Medication Sig Dispense Refill  . levonorgestrel-ethinyl estradiol (AVIANE) 0.1-20 MG-MCG tablet Take 1 tablet by mouth daily. 3 Package 4  . mirabegron ER (MYRBETRIQ) 25 MG TB24 tablet Take 1 tablet (25 mg total) by mouth daily. 30 tablet 1  . terconazole (TERAZOL 7) 0.4 % vaginal cream Place 1 applicator vaginally at bedtime. 45 g 0  . Triamcinolone Acetonide (TRIAMCINOLONE 0.1 % CREAM : EUCERIN) CREA Apply 1 application topically 2 (two) times daily. 1 each 0  . Vitamin D, Ergocalciferol, (DRISDOL) 50000 units CAPS capsule Take 1 capsule (50,000 Units total) by mouth every 7 (seven) days. 30 capsule 1   No current  facility-administered medications on file prior to visit.     Social History  Substance Use Topics  . Smoking status: Never Smoker  . Smokeless tobacco: Never Used  . Alcohol use No    Review of Systems  Constitutional: Negative for chills and fever.  Respiratory: Negative for cough.   Cardiovascular: Negative for chest pain and palpitations.  Gastrointestinal: Negative for nausea and vomiting.  Genitourinary: Positive for urgency. Negative for difficulty urinating, dysuria and frequency.      Objective:    BP 132/74   Pulse (!) 102   Temp 97.7 F (36.5 C) (Oral)   Ht 5\' 1"  (1.549 m)   Wt 197 lb 9.6 oz (89.6 kg)   SpO2 98%   BMI 37.34 kg/m  BP Readings from Last 3 Encounters:  01/11/17 132/74  11/12/16 (!) 137/92  09/25/16 (!) 142/80   Wt Readings from Last 3 Encounters:  01/11/17 197 lb 9.6 oz (89.6 kg)  11/12/16 202 lb (91.6 kg)  09/25/16 202 lb 12.8 oz (92 kg)    Physical Exam  Constitutional: She appears well-developed and well-nourished.  Eyes: Conjunctivae are normal.  Cardiovascular: Normal rate, regular rhythm, normal heart sounds and normal pulses.   Pulmonary/Chest: Effort normal and breath sounds normal. She has no wheezes. She has no rhonchi. She has no rales.  Neurological: She is alert.  Skin: Skin is warm and dry.  Psychiatric:  She has a normal mood and affect. Her speech is normal and behavior is normal. Thought content normal.  Vitals reviewed.      Assessment & Plan:   Problem List Items Addressed This Visit      Other   Pre-diabetes    a1c 6.3. Patient prefers lifestyle changes which I supported. We will regroup in 3 months and repeat a1c.       Hematuria    Discussed work up. Reviewed CT adbomen and pelvis. Pt will continue periodic f/u with Erlene Quan due to h/o smoking.       Mixed stress and urge urinary incontinence    Discussed pelvic floor PT. Patient declines and would like to see if weight loss will help.           I  have discontinued Ms. Reist's metFORMIN. I am also having her maintain her triamcinolone 0.1 % cream : eucerin, mirabegron ER, terconazole, levonorgestrel-ethinyl estradiol, and Vitamin D (Ergocalciferol).   No orders of the defined types were placed in this encounter.   Return precautions given.   Risks, benefits, and alternatives of the medications and treatment plan prescribed today were discussed, and patient expressed understanding.   Education regarding symptom management and diagnosis given to patient on AVS.  Continue to follow with Burnard Hawthorne, FNP for routine health maintenance.   Kirby Funk and I agreed with plan.   Mable Paris, FNP

## 2017-01-11 NOTE — Assessment & Plan Note (Signed)
Discussed pelvic floor PT. Patient declines and would like to see if weight loss will help.

## 2017-01-11 NOTE — Patient Instructions (Signed)
Let's try lifestyle and recheck A1c in a couple of months  This is  Dr. Lupita Dawn  example of a  "Low GI"  Diet:  It will allow you to lose 4 to 8  lbs  per month if you follow it carefully.  Your goal with exercise is a minimum of 30 minutes of aerobic exercise 5 days per week (Walking does not count once it becomes easy!)    All of the foods can be found at grocery stores and in bulk at [employer].  The Atkins protein bars and shakes are available in more varieties at Target, [employer] and [place].     7 AM Breakfast:  Choose from the following:  Low carbohydrate Protein  Shakes (I recommend the  Premier Protein chocolate shakes,  EAS AdvantEdge "Carb Control" shakes  Or the Atkins shakes all are under 3 net carbs)     a scrambled egg/bacon/cheese burrito made with Mission's "carb balance" whole wheat tortilla  (about 10 net carbs )  Regulatory affairs officer (basically a quiche without the pastry crust) that is eaten cold and very convenient way to get your eggs.  8 carbs)  If you make your own protein shakes, avoid bananas and pineapple,  And use low carb greek yogurt or original /unsweetened almond or soy milk    Avoid cereal and bananas, oatmeal and cream of wheat and grits. They are loaded with carbohydrates!   10 AM: high protein snack:  Protein bar by Atkins (the snack size, under 200 cal, usually < 6 net carbs).    A stick of cheese:  Around 1 carb,  100 cal     Dannon Light n Fit [place] Yogurt  (80 cal, 8 carbs)  Other so called "protein bars" and Greek yogurts tend to be loaded with carbohydrates.  Remember, in food advertising, the word "energy" is synonymous for " carbohydrate."  Lunch:   A Sandwich using the bread choices listed, Can use any  Eggs,  lunchmeat, grilled meat or canned tuna), avocado, regular mayo/mustard  and cheese.  A Salad using blue cheese, ranch,  Goddess or vinagrette,  Avoid taco shells, croutons or "confetti" and no "candied nuts" but  regular nuts OK.   No pretzels, nabs  or chips.  Pickles and miniature sweet peppers are a good low carb alternative that provide a "crunch"  The bread is the only source of carbohydrate in a sandwich and  can be decreased by trying some of the attached alternatives to traditional loaf bread   Avoid "Low fat dressings, as well as [place] dressings They are loaded with sugar!   3 PM/ Mid day  Snack:  Consider  1 ounce of  almonds, walnuts, pistachios, pecans, peanuts,  Macadamia nuts or a nut medley.  Avoid "granola and granola bars "  Mixed nuts are ok in moderation as long as there are no raisins,  cranberries or dried fruit.   KIND bars are OK if you get the low glycemic index variety   Try the prosciutto/mozzarella cheese sticks by [employer]  In deli /backery section   High protein      6 PM  Dinner:     Meat/fowl/fish with a green salad, and either broccoli, cauliflower, green beans, spinach, brussel sprouts or  Lima beans. DO NOT BREAD THE PROTEIN!!      There is a low carb pasta by Dreamfield's that is acceptable and tastes great: only 5 digestible carbs/serving.( All  grocery stores but BJs carry it ) Several ready made meals are available low carb:   Try Michel Angelo's chicken piccata or chicken or eggplant parm over low carb pasta.(Lowes and BJs)   Marjory Lies Sanchez's "Carnitas" (pulled pork, no sauce,  0 carbs) or his beef pot roast to make a dinner burrito (at [employer]J's)  Pesto over low carb pasta (bj's sells a good quality pesto in the center refrigerated section of the deli   Try satueeing  Cheral Marker with mushroooms as a good side   [employer] Giant makes a mashed cauliflower that tastes like mashed potatoes  Whole wheat pasta is still full of digestible carbs and  Not as low in glycemic index as Dreamfield's.   Brown rice is still rice,  So skip the rice and noodles if you eat [place] or [place] (or at least limit to 1/2 cup)  9 PM snack :   Breyer's "low carb" fudgsicle  or  ice cream bar (Carb Smart line), or  Weight Watcher's ice cream bar , or another "no sugar added" ice cream;  a serving of fresh berries/cherries with whipped cream   Cheese or DANNON'S LlGHT N FIT GREEK YOGURT  8 ounces of [place] unsweetened almond/cococunut milk    Treat yourself to a parfait made with whipped cream blueberiies, walnuts and vanilla greek yogurt  Avoid bananas, pineapple, grapes  and watermelon on a regular basis because they are high in sugar.  THINK OF THEM AS DESSERT  Remember that snack Substitutions should be less than 10 NET carbs per serving and meals < 20 carbs. Remember to subtract fiber grams to get the "net carbs."  @TULLOBREADPACKAGE @

## 2017-01-11 NOTE — Assessment & Plan Note (Signed)
Discussed work up. Reviewed CT adbomen and pelvis. Pt will continue periodic f/u with Erlene Quan due to h/o smoking.

## 2017-01-19 ENCOUNTER — Other Ambulatory Visit: Payer: Self-pay | Admitting: Family

## 2017-01-19 DIAGNOSIS — F341 Dysthymic disorder: Secondary | ICD-10-CM | POA: Diagnosis not present

## 2017-01-19 DIAGNOSIS — E669 Obesity, unspecified: Secondary | ICD-10-CM

## 2017-02-04 DIAGNOSIS — F341 Dysthymic disorder: Secondary | ICD-10-CM | POA: Diagnosis not present

## 2017-02-05 ENCOUNTER — Encounter: Payer: Self-pay | Admitting: Dietician

## 2017-02-05 ENCOUNTER — Encounter: Payer: Federal, State, Local not specified - PPO | Attending: Family | Admitting: Dietician

## 2017-02-05 VITALS — Ht 61.0 in | Wt 196.5 lb

## 2017-02-05 DIAGNOSIS — R7303 Prediabetes: Secondary | ICD-10-CM | POA: Diagnosis not present

## 2017-02-05 DIAGNOSIS — Z6835 Body mass index (BMI) 35.0-35.9, adult: Secondary | ICD-10-CM

## 2017-02-05 DIAGNOSIS — E6609 Other obesity due to excess calories: Secondary | ICD-10-CM

## 2017-02-05 NOTE — Progress Notes (Signed)
Medical Nutrition Therapy: Visit start time: 1330  end time: 1430  Assessment:  Diagnosis: mild obesity, pre-diabetes Past medical history: none significant Psychosocial issues/ stress concerns: patient reports high stress level due to family issues Preferred learning method:  . Auditory . Visual . Hands-on  Current weight: 196.5lbs  Height: 5'1' Medications, supplements: none at this time  Progress and evaluation: Patient reports BGs in pre-diabetes range for past 2-3 years. No recent weight changes. She has recently worked to decrease carbohydrate intake in effort to improve BGs and would like to lose weight to reduce diabetes risk and other health risk.  She teaches dance classes in the evenings, but states the class structure is such that she does not need to do much activity herself.   Physical activity: sporadic, bike at gym (unable to tolerate treadmill due to knee pain)  Dietary Intake:  Usual eating pattern includes 3 meals and 2 snacks per day. Dining out frequency: 1 meals per week.  Breakfast: 1c coffee on awakening; 30-27min later eats breakfast, eggs sausage patty, multigrain toast, blueberries. Sometimes feels hungry or unsatisfied after breakfast.  Snack: apple and peanut butter or similar.  Lunch: if home cheese and pepperoni slices on crackers -- usually light meal Snack: sometimes fruit and nut butter, etc. Was eating chips Supper: grilled chicken, sweet potato, asparagus; steak Snack: occasionally blueberries and plain yogurt; apple and almond butter Beverages: water, coffee in am, occasionally lightly sweetened tea.   Nutrition Care Education: Topics covered: weight control, diabetes Basic nutrition: basic food groups, appropriate nutrient balance, appropriate meal and snack schedule, general nutrition guidelines    Weight control: benefits of weight control, importance of balanced nutrition for appetite and hunger control, basic meal planning for about 1300kcal  daily intake, benefits of tracking food intake, role of exercise in both weight management and blood sugar control. Diabetes: appropriate meal and snack schedule, appropriate carb intake and balance, basic meal planning using plate method and food models, healthy vs unhealthy carb choices.    Nutritional Diagnosis:  [place]-2.2 Altered nutrition-related laboratory As related to hyperglycemia.  As evidenced by HbA1C of 6.3%. [place]-3.3 Overweight/obesity As related to history of excess calories, inactivity.  As evidenced by BMI 37, patient report.  Intervention: Instruction as noted above.   Set goals with direction from patient.   Commended patient for lifestyle changes she has already made.   Education Materials given:  . General diet guidelines for Diabetes . Food lists/ Planning A Balanced Meal . Sample meal pattern . Goals/ instructions  Learner/ who was taught:  . Patient   Level of understanding: Marland Kitchen Verbalizes/ demonstrates competency  Demonstrated degree of understanding via:   Teach back Learning barriers: . None  Willingness to learn/ readiness for change: . Eager, change in progress  Monitoring and Evaluation:  Dietary intake, exercise, BG control, and body weight      follow up: 03/04/17

## 2017-02-05 NOTE — Patient Instructions (Addendum)
   Plan to keep a healthy snack on hand for times you are unable to stop for a meal or your next meal is more than 4 hours away.   Keep working to increase physical activity and exercise, increase the number of days each week and/or the duration each day.   Consider tracking food intake by keeping a written food diary or using a phone app such as MyFitnessPal, or LoseIt, or Spark people (all are free).   Continue making healthy food choices, good job so far!

## 2017-02-12 ENCOUNTER — Ambulatory Visit
Admission: RE | Admit: 2017-02-12 | Discharge: 2017-02-12 | Disposition: A | Discharge status: Home / Self Care | Payer: Federal, State, Local not specified - PPO | Source: Ambulatory Visit | Attending: Obstetrics and Gynecology | Admitting: Obstetrics and Gynecology

## 2017-02-12 DIAGNOSIS — Z1231 Encounter for screening mammogram for malignant neoplasm of breast: Secondary | ICD-10-CM | POA: Insufficient documentation

## 2017-02-12 DIAGNOSIS — Z01419 Encounter for gynecological examination (general) (routine) without abnormal findings: Secondary | ICD-10-CM

## 2017-03-04 DIAGNOSIS — L309 Dermatitis, unspecified: Secondary | ICD-10-CM | POA: Diagnosis not present

## 2017-03-04 DIAGNOSIS — M25561 Pain in right knee: Secondary | ICD-10-CM | POA: Diagnosis not present

## 2017-03-04 DIAGNOSIS — G8929 Other chronic pain: Secondary | ICD-10-CM | POA: Diagnosis not present

## 2017-03-04 DIAGNOSIS — E669 Obesity, unspecified: Secondary | ICD-10-CM | POA: Diagnosis not present

## 2017-03-04 DIAGNOSIS — I7300 Raynaud's syndrome without gangrene: Secondary | ICD-10-CM | POA: Insufficient documentation

## 2017-03-04 DIAGNOSIS — M179 Osteoarthritis of knee, unspecified: Secondary | ICD-10-CM | POA: Diagnosis not present

## 2017-03-05 ENCOUNTER — Ambulatory Visit: Payer: Federal, State, Local not specified - PPO | Admitting: Dietician

## 2017-03-11 DIAGNOSIS — F341 Dysthymic disorder: Secondary | ICD-10-CM | POA: Diagnosis not present

## 2017-03-24 DIAGNOSIS — M1711 Unilateral primary osteoarthritis, right knee: Secondary | ICD-10-CM | POA: Diagnosis not present

## 2017-03-25 ENCOUNTER — Telehealth: Payer: Self-pay | Admitting: Dietician

## 2017-03-25 DIAGNOSIS — F341 Dysthymic disorder: Secondary | ICD-10-CM | POA: Diagnosis not present

## 2017-03-25 NOTE — Telephone Encounter (Signed)
Called patient to reschedule her appointment from 03/05/17, which she missed. Spoke with patient directly, she missed the appointment due to a death in the family, and does wish to reschedule, but unable to do so today. She will call next week to reschedule.

## 2017-04-05 DIAGNOSIS — M1711 Unilateral primary osteoarthritis, right knee: Secondary | ICD-10-CM | POA: Diagnosis not present

## 2017-04-06 DIAGNOSIS — F341 Dysthymic disorder: Secondary | ICD-10-CM | POA: Diagnosis not present

## 2017-04-12 DIAGNOSIS — M1711 Unilateral primary osteoarthritis, right knee: Secondary | ICD-10-CM | POA: Diagnosis not present

## 2017-04-19 DIAGNOSIS — M1711 Unilateral primary osteoarthritis, right knee: Secondary | ICD-10-CM | POA: Diagnosis not present

## 2017-04-22 DIAGNOSIS — F341 Dysthymic disorder: Secondary | ICD-10-CM | POA: Diagnosis not present

## 2017-04-23 ENCOUNTER — Encounter: Payer: Self-pay | Admitting: Dietician

## 2017-04-23 NOTE — Progress Notes (Signed)
Have not heard back from patient to reschedule. Sent discharge letter to MD.

## 2017-05-01 ENCOUNTER — Encounter: Payer: Self-pay | Admitting: Family

## 2017-05-03 DIAGNOSIS — M1711 Unilateral primary osteoarthritis, right knee: Secondary | ICD-10-CM | POA: Diagnosis not present

## 2017-05-04 ENCOUNTER — Other Ambulatory Visit: Payer: Self-pay | Admitting: Family

## 2017-05-10 DIAGNOSIS — M1711 Unilateral primary osteoarthritis, right knee: Secondary | ICD-10-CM | POA: Diagnosis not present

## 2017-05-13 DIAGNOSIS — F341 Dysthymic disorder: Secondary | ICD-10-CM | POA: Diagnosis not present

## 2017-05-17 DIAGNOSIS — M1711 Unilateral primary osteoarthritis, right knee: Secondary | ICD-10-CM | POA: Diagnosis not present

## 2017-05-24 DIAGNOSIS — M1711 Unilateral primary osteoarthritis, right knee: Secondary | ICD-10-CM | POA: Diagnosis not present

## 2017-06-03 DIAGNOSIS — F341 Dysthymic disorder: Secondary | ICD-10-CM | POA: Diagnosis not present

## 2017-07-05 DIAGNOSIS — F341 Dysthymic disorder: Secondary | ICD-10-CM | POA: Diagnosis not present

## 2017-07-22 DIAGNOSIS — F341 Dysthymic disorder: Secondary | ICD-10-CM | POA: Diagnosis not present

## 2017-08-05 DIAGNOSIS — F341 Dysthymic disorder: Secondary | ICD-10-CM | POA: Diagnosis not present

## 2017-08-16 DIAGNOSIS — L301 Dyshidrosis [pompholyx]: Secondary | ICD-10-CM | POA: Diagnosis not present

## 2017-08-23 DIAGNOSIS — F341 Dysthymic disorder: Secondary | ICD-10-CM | POA: Diagnosis not present

## 2017-09-21 DIAGNOSIS — F341 Dysthymic disorder: Secondary | ICD-10-CM | POA: Diagnosis not present

## 2017-10-05 DIAGNOSIS — F341 Dysthymic disorder: Secondary | ICD-10-CM | POA: Diagnosis not present

## 2017-10-19 DIAGNOSIS — F341 Dysthymic disorder: Secondary | ICD-10-CM | POA: Diagnosis not present

## 2017-11-04 ENCOUNTER — Other Ambulatory Visit: Payer: Self-pay | Admitting: Obstetrics and Gynecology

## 2017-11-04 DIAGNOSIS — F341 Dysthymic disorder: Secondary | ICD-10-CM | POA: Diagnosis not present

## 2017-11-18 ENCOUNTER — Encounter: Payer: Self-pay | Admitting: Obstetrics and Gynecology

## 2017-11-18 ENCOUNTER — Ambulatory Visit (INDEPENDENT_AMBULATORY_CARE_PROVIDER_SITE_OTHER): Payer: Federal, State, Local not specified - PPO | Admitting: Obstetrics and Gynecology

## 2017-11-18 VITALS — BP 118/84 | HR 87 | Ht 61.0 in | Wt 199.9 lb

## 2017-11-18 DIAGNOSIS — E559 Vitamin D deficiency, unspecified: Secondary | ICD-10-CM | POA: Diagnosis not present

## 2017-11-18 DIAGNOSIS — Z01419 Encounter for gynecological examination (general) (routine) without abnormal findings: Secondary | ICD-10-CM

## 2017-11-18 MED ORDER — LESSINA 0.1-20 MG-MCG PO TABS: ORAL_TABLET | ORAL | 4 refills | Status: DC

## 2017-11-18 NOTE — Progress Notes (Signed)
Subjective:   Sierra Moore is a 47 y.o. G2P0 Caucasian female here for a routine well-woman exam.  No LMP recorded. (Menstrual status: Oral contraceptives).    Current complaints: feels tired- like she did in past when vit d has been low. Some breast tenderness, but cut back caffeine and it resolves. Pharmacy changed OCP generics and current brand dissolves quickly and taste awful.  Recently got back together with ex-had intercourse and had spotting a day. afterwards.  PCP: Ouida Sills       does desire labs  Social History: Sexual: heterosexual Marital Status: married Living situation: with family Occupation: Pharmacist, hospital Tobacco/alcohol: no tobacco use Illicit drugs: no history of illicit drug use  The following portions of the patient's history were reviewed and updated as appropriate: allergies, current medications, past family history, past medical history, past social history, past surgical history and problem list.  Past Medical History Past Medical History:  Diagnosis Date  . Allergy   . GERD (gastroesophageal reflux disease)   . Leg swelling   . Obesity   . PMDD (premenstrual dysphoric disorder)   . Vitamin D deficiency     Past Surgical History Past Surgical History:  Procedure Laterality Date  . TONSILLECTOMY      Gynecologic History G2P0  No LMP recorded. (Menstrual status: Oral contraceptives). Contraception: OCP (estrogen/progesterone) Last Pap: 2017. Results were: normal Last mammogram: 02/2017. Results were: normal   Obstetric History OB History  Gravida Para Term Preterm AB Living  2         2  SAB TAB Ectopic Multiple Live Births          2    # Outcome Date GA Lbr Len/2nd Weight Sex Delivery Anes PTL Lv  2 Gravida 2000    F Vag-Spont   LIV  1 Gravida 1997    M Vag-Spont   LIV    Current Medications Current Outpatient Medications on File Prior to Visit  Medication Sig Dispense Refill  . LESSINA-28 0.1-20 MG-MCG tablet TAKE 1 TABLET BY MOUTH DAILY  CONTINUOUSLY 84 tablet 0   No current facility-administered medications on file prior to visit.     Review of Systems Patient denies any headaches, blurred vision, shortness of breath, chest pain, abdominal pain, problems with bowel movements, urination, or intercourse.  Objective:  BP 118/84   Pulse 87   Ht 5\' 1"  (1.549 m)   Wt 199 lb 14.4 oz (90.7 kg)   BMI 37.77 kg/m  Physical Exam  General:  Well developed, well nourished, no acute distress. She is alert and oriented x3. Skin:  Warm and dry Neck:  Midline trachea, no thyromegaly or nodules Cardiovascular: Regular rate and rhythm, no murmur heard Lungs:  Effort normal, all lung fields clear to auscultation bilaterally Breasts:  No dominant palpable mass, retraction, or nipple discharge Abdomen:  Soft, non tender, no hepatosplenomegaly or masses Pelvic:  External genitalia is normal in appearance.  The vagina is normal in appearance. The cervix is bulbous, no CMT.  Thin prep pap is not done . Uterus is felt to be normal size, shape, and contour.  No adnexal masses or tenderness noted. Extremities:  No swelling or varicosities noted Psych:  She has a normal mood and affect  Assessment:   Healthy well-woman exam Pre-diabetes Vit D deficiency Obesity   Plan:  Labs obtained-will follow up accordingly F/U 1 year for AE, or sooner if needed Mammogram ordered  Kayle Correa Rockney Ghee, CNM

## 2017-11-19 LAB — COMPREHENSIVE METABOLIC PANEL
A/G RATIO: 1.4 (ref 1.2–2.2)
ALK PHOS: 110 IU/L (ref 39–117)
ALT: 13 IU/L (ref 0–32)
AST: 13 IU/L (ref 0–40)
Albumin: 3.8 g/dL (ref 3.5–5.5)
BILIRUBIN TOTAL: 0.2 mg/dL (ref 0.0–1.2)
BUN/Creatinine Ratio: 15 (ref 9–23)
BUN: 11 mg/dL (ref 6–24)
CHLORIDE: 104 mmol/L (ref 96–106)
CO2: 20 mmol/L (ref 20–29)
Calcium: 8.7 mg/dL (ref 8.7–10.2)
Creatinine, Ser: 0.74 mg/dL (ref 0.57–1.00)
GFR calc Af Amer: 111 mL/min/{1.73_m2} (ref >59)
GFR calc non Af Amer: 96 mL/min/{1.73_m2} (ref >59)
GLOBULIN, TOTAL: 2.8 g/dL (ref 1.5–4.5)
GLUCOSE: 99 mg/dL (ref 65–99)
Potassium: 5 mmol/L (ref 3.5–5.2)
Sodium: 140 mmol/L (ref 134–144)
TOTAL PROTEIN: 6.6 g/dL (ref 6.0–8.5)

## 2017-11-19 LAB — LIPID PANEL
CHOL/HDL RATIO: 4.8 ratio — AB (ref 0.0–4.4)
Cholesterol, Total: 184 mg/dL (ref 100–199)
HDL: 38 mg/dL — AB (ref >39)
LDL CALC: 125 mg/dL — AB (ref 0–99)
Triglycerides: 105 mg/dL (ref 0–149)
VLDL Cholesterol Cal: 21 mg/dL (ref 5–40)

## 2017-11-19 LAB — HEMOGLOBIN A1C
ESTIMATED AVERAGE GLUCOSE: 128 mg/dL
HEMOGLOBIN A1C: 6.1 % — AB (ref 4.8–5.6)

## 2017-11-19 LAB — VITAMIN D 25 HYDROXY (VIT D DEFICIENCY, FRACTURES): VIT D 25 HYDROXY: 22 ng/mL — AB (ref 30.0–100.0)

## 2017-11-19 LAB — THYROID PANEL WITH TSH
Free Thyroxine Index: 1.8 (ref 1.2–4.9)
T3 Uptake Ratio: 17 % — ABNORMAL LOW (ref 24–39)
T4 TOTAL: 10.6 ug/dL (ref 4.5–12.0)
TSH: 2.13 u[IU]/mL (ref 0.450–4.500)

## 2017-11-23 DIAGNOSIS — F341 Dysthymic disorder: Secondary | ICD-10-CM | POA: Diagnosis not present

## 2017-11-25 ENCOUNTER — Other Ambulatory Visit: Payer: Self-pay | Admitting: Obstetrics and Gynecology

## 2017-11-25 MED ORDER — VITAMIN D (ERGOCALCIFEROL) 1.25 MG (50000 UNIT) PO CAPS: 50000.0000 [IU] | ORAL_CAPSULE | ORAL | 1 refills | Status: DC

## 2017-12-07 DIAGNOSIS — F341 Dysthymic disorder: Secondary | ICD-10-CM | POA: Diagnosis not present

## 2017-12-21 DIAGNOSIS — F411 Generalized anxiety disorder: Secondary | ICD-10-CM | POA: Diagnosis not present

## 2018-01-04 DIAGNOSIS — K080 Exfoliation of teeth due to systemic causes: Secondary | ICD-10-CM | POA: Diagnosis not present

## 2018-01-11 DIAGNOSIS — F341 Dysthymic disorder: Secondary | ICD-10-CM | POA: Diagnosis not present

## 2018-01-18 DIAGNOSIS — K080 Exfoliation of teeth due to systemic causes: Secondary | ICD-10-CM | POA: Diagnosis not present

## 2018-01-27 DIAGNOSIS — F341 Dysthymic disorder: Secondary | ICD-10-CM | POA: Diagnosis not present

## 2018-02-15 DIAGNOSIS — F411 Generalized anxiety disorder: Secondary | ICD-10-CM | POA: Diagnosis not present

## 2018-02-17 ENCOUNTER — Other Ambulatory Visit: Payer: Self-pay | Admitting: Obstetrics and Gynecology

## 2018-02-17 DIAGNOSIS — Z1231 Encounter for screening mammogram for malignant neoplasm of breast: Secondary | ICD-10-CM

## 2018-02-28 DIAGNOSIS — F411 Generalized anxiety disorder: Secondary | ICD-10-CM | POA: Diagnosis not present

## 2018-03-04 ENCOUNTER — Ambulatory Visit
Admission: RE | Admit: 2018-03-04 | Discharge: 2018-03-04 | Disposition: A | Discharge status: Home / Self Care | Payer: Federal, State, Local not specified - PPO | Source: Ambulatory Visit | Attending: Obstetrics and Gynecology | Admitting: Obstetrics and Gynecology

## 2018-03-04 DIAGNOSIS — Z1231 Encounter for screening mammogram for malignant neoplasm of breast: Secondary | ICD-10-CM | POA: Insufficient documentation

## 2018-03-15 DIAGNOSIS — F411 Generalized anxiety disorder: Secondary | ICD-10-CM | POA: Diagnosis not present

## 2018-03-22 DIAGNOSIS — F411 Generalized anxiety disorder: Secondary | ICD-10-CM | POA: Diagnosis not present

## 2018-03-23 DIAGNOSIS — M25551 Pain in right hip: Secondary | ICD-10-CM | POA: Diagnosis not present

## 2018-03-31 DIAGNOSIS — F411 Generalized anxiety disorder: Secondary | ICD-10-CM | POA: Diagnosis not present

## 2018-04-14 DIAGNOSIS — F411 Generalized anxiety disorder: Secondary | ICD-10-CM | POA: Diagnosis not present

## 2018-04-21 DIAGNOSIS — M1711 Unilateral primary osteoarthritis, right knee: Secondary | ICD-10-CM | POA: Diagnosis not present

## 2018-05-03 DIAGNOSIS — F411 Generalized anxiety disorder: Secondary | ICD-10-CM | POA: Diagnosis not present

## 2018-05-11 DIAGNOSIS — M1711 Unilateral primary osteoarthritis, right knee: Secondary | ICD-10-CM | POA: Diagnosis not present

## 2018-05-17 DIAGNOSIS — F411 Generalized anxiety disorder: Secondary | ICD-10-CM | POA: Diagnosis not present

## 2018-05-20 DIAGNOSIS — M1711 Unilateral primary osteoarthritis, right knee: Secondary | ICD-10-CM | POA: Diagnosis not present

## 2018-05-25 DIAGNOSIS — M1711 Unilateral primary osteoarthritis, right knee: Secondary | ICD-10-CM | POA: Diagnosis not present

## 2018-05-31 DIAGNOSIS — F411 Generalized anxiety disorder: Secondary | ICD-10-CM | POA: Diagnosis not present

## 2018-06-01 DIAGNOSIS — M1711 Unilateral primary osteoarthritis, right knee: Secondary | ICD-10-CM | POA: Diagnosis not present

## 2018-06-08 DIAGNOSIS — M1711 Unilateral primary osteoarthritis, right knee: Secondary | ICD-10-CM | POA: Diagnosis not present

## 2018-06-16 DIAGNOSIS — F411 Generalized anxiety disorder: Secondary | ICD-10-CM | POA: Diagnosis not present

## 2018-06-24 DIAGNOSIS — M1711 Unilateral primary osteoarthritis, right knee: Secondary | ICD-10-CM | POA: Diagnosis not present

## 2018-07-01 DIAGNOSIS — M1711 Unilateral primary osteoarthritis, right knee: Secondary | ICD-10-CM | POA: Diagnosis not present

## 2018-07-07 DIAGNOSIS — F341 Dysthymic disorder: Secondary | ICD-10-CM | POA: Diagnosis not present

## 2018-07-08 DIAGNOSIS — M1711 Unilateral primary osteoarthritis, right knee: Secondary | ICD-10-CM | POA: Diagnosis not present

## 2018-07-15 ENCOUNTER — Ambulatory Visit: Payer: Federal, State, Local not specified - PPO | Admitting: Obstetrics and Gynecology

## 2018-07-15 ENCOUNTER — Encounter: Payer: Self-pay | Admitting: Obstetrics and Gynecology

## 2018-07-15 VITALS — BP 144/88 | HR 98 | Ht 61.0 in | Wt 201.6 lb

## 2018-07-15 DIAGNOSIS — B356 Tinea cruris: Secondary | ICD-10-CM | POA: Diagnosis not present

## 2018-07-15 DIAGNOSIS — M1711 Unilateral primary osteoarthritis, right knee: Secondary | ICD-10-CM | POA: Diagnosis not present

## 2018-07-15 MED ORDER — TERCONAZOLE 0.4 % VA CREA: 1.0000 | TOPICAL_CREAM | Freq: Every day | VAGINAL | 0 refills | Status: DC

## 2018-07-15 NOTE — Progress Notes (Signed)
  Subjective:     Patient ID: Sierra Moore, female   DOB: 1971/04/02, 48 y.o.   MRN: [id number]  HPI Reports small irritation a few weeks ago at top of gluteal fold, then spread down towards rectum, itching and irritation. No bleeding noticed. Has always had sensitive skin especially in that area. Does have h/o eczema on hands and psoriasis on feet. But this feels different. Tried OTC diaper rash cream last night and this morning.   No antibiotic use in last month.  Also vaginally skin burns when urine hits it.   Denies new soap or detergents.   Review of Systems  Skin: Positive for rash.  All other systems reviewed and are negative.      Objective:   Physical Exam A&Ox4 Well groomed female in no distress Blood pressure (!) 144/88, pulse 98, height 5\' 1"  (1.549 m), weight 201 lb 9.6 oz (91.4 kg). Skin in gluteal fold red with dry raised lesions spreading onto right buttocks c/w tinea, also extends around rectum and forward onto labia majora. No increased vaginal discharge noted. No drainage noted from rash and does has desitin cream on top of it making exam difficult.      Assessment:     Tinea of gluteal fold and vulva    Plan:     terazol cream vaginal x 3 nights then topically 2 times a day to affected area. Will consider triamcinolone cream if not improved by next week.   RTC as needed.  Zen Felling,CNM

## 2018-07-19 DIAGNOSIS — F411 Generalized anxiety disorder: Secondary | ICD-10-CM | POA: Diagnosis not present

## 2018-08-04 DIAGNOSIS — F411 Generalized anxiety disorder: Secondary | ICD-10-CM | POA: Diagnosis not present

## 2018-08-09 DIAGNOSIS — M1711 Unilateral primary osteoarthritis, right knee: Secondary | ICD-10-CM | POA: Diagnosis not present

## 2018-08-16 DIAGNOSIS — F411 Generalized anxiety disorder: Secondary | ICD-10-CM | POA: Diagnosis not present

## 2018-08-25 ENCOUNTER — Emergency Department: Payer: Federal, State, Local not specified - PPO

## 2018-08-25 ENCOUNTER — Encounter: Payer: Self-pay | Admitting: Emergency Medicine

## 2018-08-25 ENCOUNTER — Emergency Department
Admission: EM | Admit: 2018-08-25 | Discharge: 2018-08-25 | Disposition: A | Discharge status: Home / Self Care | Payer: Federal, State, Local not specified - PPO | Attending: Emergency Medicine | Admitting: Emergency Medicine

## 2018-08-25 DIAGNOSIS — R079 Chest pain, unspecified: Secondary | ICD-10-CM | POA: Insufficient documentation

## 2018-08-25 DIAGNOSIS — R1013 Epigastric pain: Secondary | ICD-10-CM | POA: Diagnosis not present

## 2018-08-25 DIAGNOSIS — R000 Tachycardia, unspecified: Secondary | ICD-10-CM | POA: Diagnosis not present

## 2018-08-25 DIAGNOSIS — N200 Calculus of kidney: Secondary | ICD-10-CM | POA: Diagnosis not present

## 2018-08-25 DIAGNOSIS — R1011 Right upper quadrant pain: Secondary | ICD-10-CM

## 2018-08-25 DIAGNOSIS — K8020 Calculus of gallbladder without cholecystitis without obstruction: Secondary | ICD-10-CM | POA: Diagnosis not present

## 2018-08-25 DIAGNOSIS — I10 Essential (primary) hypertension: Secondary | ICD-10-CM | POA: Diagnosis not present

## 2018-08-25 LAB — URINALYSIS, COMPLETE (UACMP) WITH MICROSCOPIC
Bacteria, UA: NONE SEEN
Bilirubin Urine: NEGATIVE
GLUCOSE, UA: NEGATIVE mg/dL
Ketones, ur: NEGATIVE mg/dL
Leukocytes,Ua: NEGATIVE
Nitrite: NEGATIVE
Protein, ur: NEGATIVE mg/dL
Specific Gravity, Urine: 1.018 (ref 1.005–1.030)
pH: 6 (ref 5.0–8.0)

## 2018-08-25 LAB — CBC
HCT: 47.6 % — ABNORMAL HIGH (ref 36.0–46.0)
Hemoglobin: 14.7 g/dL (ref 12.0–15.0)
MCH: 25.7 pg — AB (ref 26.0–34.0)
MCHC: 30.9 g/dL (ref 30.0–36.0)
MCV: 83.2 fL (ref 80.0–100.0)
Platelets: 435 10*3/uL — ABNORMAL HIGH (ref 150–400)
RBC: 5.72 MIL/uL — ABNORMAL HIGH (ref 3.87–5.11)
RDW: 14.4 % (ref 11.5–15.5)
WBC: 15.6 10*3/uL — ABNORMAL HIGH (ref 4.0–10.5)
nRBC: 0 % (ref 0.0–0.2)

## 2018-08-25 LAB — PREGNANCY, URINE: Preg Test, Ur: NEGATIVE

## 2018-08-25 LAB — COMPREHENSIVE METABOLIC PANEL
ALT: 22 U/L (ref 0–44)
ANION GAP: 7 (ref 5–15)
AST: 38 U/L (ref 15–41)
Albumin: 3.7 g/dL (ref 3.5–5.0)
Alkaline Phosphatase: 109 U/L (ref 38–126)
BUN: 15 mg/dL (ref 6–20)
CHLORIDE: 106 mmol/L (ref 98–111)
CO2: 24 mmol/L (ref 22–32)
Calcium: 8.7 mg/dL — ABNORMAL LOW (ref 8.9–10.3)
Creatinine, Ser: 0.75 mg/dL (ref 0.44–1.00)
GFR calc non Af Amer: >60 mL/min (ref >60)
Glucose, Bld: 116 mg/dL — ABNORMAL HIGH (ref 70–99)
Potassium: 4.8 mmol/L (ref 3.5–5.1)
SODIUM: 137 mmol/L (ref 135–145)
Total Bilirubin: 0.8 mg/dL (ref 0.3–1.2)
Total Protein: 7.4 g/dL (ref 6.5–8.1)

## 2018-08-25 LAB — TROPONIN I: Troponin I: <0.03 ng/mL (ref <0.03)

## 2018-08-25 LAB — LIPASE, BLOOD: LIPASE: 35 U/L (ref 11–51)

## 2018-08-25 MED ORDER — ONDANSETRON 4 MG PO TBDP: 4.0000 mg | ORAL_TABLET | Freq: Three times a day (TID) | ORAL | 0 refills | Status: DC | PRN

## 2018-08-25 NOTE — Discharge Instructions (Signed)
Please avoid all fried or fatty foods to prevent pain in your gallbladder.  You may follow the bland diet instructions in this paperwork.  Please make an appointment with the general surgeons for evaluation of gallbladder removal.  If you develop pain, you may take Tylenol or Motrin.  Zofran is for nausea or vomiting.  You also do have some kidney stones that are in your kidneys.  They are not moving and are not the cause of your pain.  Please drink plenty of fluids and stay well-hydrated to prevent pain from kidney stones.  Return to the emergency department for severe pain, lightheadedness or fainting, fever, inability to keep down fluids, or any other symptoms concerning to you.

## 2018-08-25 NOTE — ED Triage Notes (Signed)
Pt in via [facility] with c/o right sided flank pain. EMS reports paint started 2 days ago. Pt states that it started at 12:15 today. Pt states had some nausea and states that the pain does not radiate around. Denies urinary sx' or other concerns.   EMS VS: 154/79, HR92, RR18, 97%RA, CBG 115, No IV.

## 2018-08-25 NOTE — ED Notes (Signed)
ED Provider at bedside. 

## 2018-08-25 NOTE — ED Provider Notes (Signed)
[facility] Emergency Department Provider Note  ____________________________________________  Time seen: Approximately 6:40 PM  I have reviewed the triage vital signs and the nursing notes.   HISTORY  Chief Complaint Flank Pain and Nausea    HPI Sierra Moore is a 48 y.o. female, otherwise healthy, presenting with right upper quadrant and right flank pain.  The patient reports that 3 days ago, after eating [place] fries, she had a right upper quadrant, right flank pain.  A completely resolved and was unassociated with any other symptoms.  Today, 1215, she developed a right flank pain/right upper quadrant pain, with associated nausea but no vomiting.  She also developed a central chest pain without any shortness of breath, lightheadedness or syncope, palpitations, burning sensation or burping.  She was given 4 baby aspirin by EMS.  The symptoms have all resolved at this time.  Past Medical History:  Diagnosis Date  . Allergy   . GERD (gastroesophageal reflux disease)   . Leg swelling   . Obesity   . PMDD (premenstrual dysphoric disorder)   . Vitamin D deficiency     Patient Active Problem List   Diagnosis Date Noted  . Dysuria 09/25/2016  . Mixed stress and urge urinary incontinence 09/25/2016  . Mild obesity 03/04/2016  . Seasonal allergies 02/10/2016  . Encounter for routine adult physical exam with abnormal findings 02/10/2016  . Heat intolerance 02/10/2016  . Hematuria 02/10/2016  . Vitamin D deficiency 10/25/2015  . Pre-diabetes 10/25/2015  . IBS (irritable bowel syndrome) 10/24/2015    Past Surgical History:  Procedure Laterality Date  . TONSILLECTOMY      Current Outpatient Rx  . Order #: [id number] Class: Normal  . Order #: [id number] Class: Print  . Order #: [id number] Class: Normal  . Order #: [id number] Class: Normal    Allergies Penicillins  Family History  Problem Relation Age of Onset  . Arthritis Mother   . Raynaud syndrome  Mother   . Arthritis Father   . Breast cancer Neg Hx   . Bladder Cancer Neg Hx   . Kidney cancer Neg Hx     Social History Social History   Tobacco Use  . Smoking status: Never Smoker  . Smokeless tobacco: Never Used  Substance Use Topics  . Alcohol use: No  . Drug use: No    Review of Systems Constitutional: No fever/chills.  No lightheadedness or syncope.  No diaphoresis. Eyes: No visual changes. ENT: No sore throat. No congestion or rhinorrhea. Cardiovascular: Positive central chest pain. Denies palpitations. Respiratory: Denies shortness of breath.  No cough. Gastrointestinal: Positive right upper quadrant abdominal pain.  Positive right flank pain.  Positive nausea, no vomiting.  No diarrhea.  No constipation. Genitourinary: Negative for dysuria.  Chronic hematuria that has been extensively worked up without any diagnosis. Musculoskeletal: Negative for back pain. Skin: Negative for rash. Neurological: Negative for headaches. No focal numbness, tingling or weakness.     ____________________________________________   PHYSICAL EXAM:  VITAL SIGNS: ED Triage Vitals  Enc Vitals Group     BP 08/25/18 1835 (!) 149/76     Pulse Rate 08/25/18 1835 79     Resp 08/25/18 1837 20     Temp --      Temp src --      SpO2 08/25/18 1835 100 %     Weight 08/25/18 1403 198 lb (89.8 kg)     Height 08/25/18 1403 5\' 1"  (1.549 m)     Head Circumference --  Peak Flow --      Pain Score 08/25/18 1403 5     Pain Loc --      Pain Edu? --      Excl. in [place]? --     Constitutional: Alert and oriented.Answers questions appropriately. Eyes: Conjunctivae are normal.  EOMI. No scleral icterus. Head: Atraumatic. Nose: No congestion/rhinnorhea. Mouth/Throat: Mucous membranes are moist.  Neck: No stridor.  Supple.  No JVD.  No meningismus. Cardiovascular: Normal rate, regular rhythm. No murmurs, rubs or gallops.  Respiratory: Normal respiratory effort.  No accessory muscle use or  retractions. Lungs CTAB.  No wheezes, rales or ronchi. Gastrointestinal: Soft, and nondistended.  No reproducible CVA tenderness to palpation.  No reproducible tenderness to palpation in the abdomen.  Negative Murphy sign no guarding or rebound.  No peritoneal signs. Musculoskeletal: No LE edema.  Neurologic:  A&Ox3.  Speech is clear.  Face and smile are symmetric.  EOMI.  Moves all extremities well. Skin:  Skin is warm, dry and intact. No rash noted. Psychiatric: Mood and affect are normal. Speech and behavior are normal.  Normal judgement  ____________________________________________   LABS (all labs ordered are listed, but only abnormal results are displayed)  Labs Reviewed  COMPREHENSIVE METABOLIC PANEL - Abnormal; Notable for the following components:      Result Value   Glucose, Bld 116 (*)    Calcium 8.7 (*)    All other components within normal limits  CBC - Abnormal; Notable for the following components:   WBC 15.6 (*)    RBC 5.72 (*)    HCT 47.6 (*)    MCH 25.7 (*)    Platelets 435 (*)    All other components within normal limits  URINALYSIS, COMPLETE (UACMP) WITH MICROSCOPIC - Abnormal; Notable for the following components:   Color, Urine YELLOW (*)    APPearance CLEAR (*)    Hgb urine dipstick MODERATE (*)    All other components within normal limits  LIPASE, BLOOD  PREGNANCY, URINE  TROPONIN I   ____________________________________________  EKG  ED ECG REPORT I, Anne-Caroline Mariea Clonts, the attending physician, personally viewed and interpreted this ECG.   Date: 08/25/2018  EKG Time: 2021  Rate: 80  Rhythm: normal sinus rhythm  Axis: normal  Intervals:none  ST&T Change: No STEMI  ____________________________________________  RADIOLOGY  Ct Renal Stone Study  Result Date: 08/25/2018 CLINICAL DATA:  Right-sided flank pain starting 2 days ago. EXAM: CT ABDOMEN AND PELVIS WITHOUT CONTRAST TECHNIQUE: Multidetector CT imaging of the abdomen and pelvis was  performed following the standard protocol without IV contrast. COMPARISON:  03/04/2016 FINDINGS: Lower chest: No acute abnormality. Hepatobiliary: Tiny gallstones are noted within the gallbladder without secondary signs of acute cholecystitis. The unenhanced liver is unremarkable. Pancreas: Unremarkable. No pancreatic ductal dilatation or surrounding inflammatory changes. Spleen: Normal in size without focal abnormality. Adrenals/Urinary Tract: Normal bilateral adrenal glands. Punctate nonobstructing interpolar calculus posteriorly within the right kidney, series 2/35. There may also be a punctate interpolar left renal calculus, series 2/30. No hydroureteronephrosis nor ureteral calculi. The urinary bladder is decompressed and. Stomach/Bowel: Scattered colonic diverticulosis without acute diverticulitis. No bowel obstruction or inflammation. Decompressed stomach. Vascular/Lymphatic: No significant vascular findings are present. No enlarged abdominal or pelvic lymph nodes. Reproductive: Uterus and bilateral adnexa are unremarkable. Other: Tiny periumbilical fat containing hernia. No free air nor free fluid. Musculoskeletal: No acute or significant osseous findings. IMPRESSION: 1. Punctate nonobstructing bilateral interpolar renal calculi. No obstructive uropathy. 2. Uncomplicated cholelithiasis. 3. Colonic diverticulosis without  acute diverticulitis. Electronically Signed   By: Ashley Royalty M.D.   On: 08/25/2018 19:33   US Abdomen Limited Ruq  Result Date: 08/25/2018 CLINICAL DATA:  Right upper quadrant pain EXAM: ULTRASOUND ABDOMEN LIMITED RIGHT UPPER QUADRANT COMPARISON:  None. FINDINGS: Gallbladder: Gallbladder is physiologically distended and contains biliary sludge in nonobstructing calculi. Comet tail artifacts emanating off the nondependent wall of the gallbladder can be seen in adenomyomatosis/cholesterolosis of the gallbladder. Additional slightly echogenic foci are off the nondependent wall may represent  small polyps or adherent stones. No secondary signs of cholecystitis. Homogeneous appearance of the included liver. Common bile duct: Diameter: 3.1 mm Liver: No focal lesion identified. Within normal limits in parenchymal echogenicity. Portal vein is patent on color Doppler imaging with normal direction of blood flow towards the liver. IMPRESSION: 1. Uncomplicated cholelithiasis. 2. Adenomyomatosis of the gallbladder characterized by "comet tail" artifacts noted off the dependent wall. Adherent stones are not entirely excluded. 3. Unremarkable appearance of the liver. Electronically Signed   By: Ashley Royalty M.D.   On: 08/25/2018 19:39    ____________________________________________   PROCEDURES  Procedure(s) performed: None  Procedures  Critical Care performed: No ____________________________________________   INITIAL IMPRESSION / ASSESSMENT AND PLAN / ED COURSE  Pertinent labs & imaging results that were available during my care of the patient were reviewed by me and considered in my medical decision making (see chart for details).  48 y.o. female presenting with right upper quadrant and right flank pain, as well as chest pain.  Overall, the patient is hemodynamically stable and afebrile.  At this time she is completely asymptomatic and has no tenderness to palpation on my examination.  She does have chronic hematuria, which she reports has been extensively worked up without any findings; this has gone on for years.  However, given her hematuria and flank pain, I would be concerned about renal colic.  In addition, her history of pain which occurs after eating is concerning for a gallbladder pathology so an ultrasound of her abdomen has been ordered.  At this time, the patient does not require any intervention as she is asymptomatic.  The patient's laboratory studies do show a white blood cell count of 15.  Her electrolytes are reassuring.  Her lipase is normal.  Plan reevaluation for final  disposition.  ----------------------------------------- 8:13 PM on 08/25/2018 -----------------------------------------  Patient's work-up in the emergency department is reassuring.  She does have cholelithiasis but is completely symptom-free at this time.  She does have an elevation in her white blood cell count, but without any pain, fever, and with the ability to keep down fluids, there is no indication for acute cholecystectomy.  I have given her the follow-up instructions with the information for the general surgeon on-call, for evaluation of possible elective gallbladder removal.  Patient CT also shows nephrolithiasis without any moving stone; this is not the cause of her pain.  I did make her aware of these so that she will make sure to stay well-hydrated.  The patient's chest pain work-up is reassuring.  Her EKG does not show any ischemic changes or arrhythmias.  Her troponin is negative.  She is not having any additional symptoms at this time.  The patient will follow-up with her primary care physician.  Follow-up instructions as well as return precautions were discussed. ____________________________________________  FINAL CLINICAL IMPRESSION(S) / ED DIAGNOSES  Final diagnoses:  Right upper quadrant pain  Calculus of gallbladder without cholecystitis without obstruction  Nephrolithiasis  Chest pain, unspecified type  NEW MEDICATIONS STARTED DURING THIS VISIT:  New Prescriptions   ONDANSETRON (ZOFRAN ODT) 4 MG DISINTEGRATING TABLET    Take 1 tablet (4 mg total) by mouth every 8 (eight) hours as needed.      Eula Listen, MD 08/25/18 2022

## 2018-08-31 ENCOUNTER — Encounter: Payer: Self-pay | Admitting: *Deleted

## 2018-08-31 ENCOUNTER — Other Ambulatory Visit: Payer: Self-pay

## 2018-08-31 ENCOUNTER — Encounter: Payer: Self-pay | Admitting: Surgery

## 2018-08-31 ENCOUNTER — Ambulatory Visit: Payer: Federal, State, Local not specified - PPO | Admitting: Surgery

## 2018-08-31 VITALS — BP 132/82 | HR 77 | Temp 97.7°F | Resp 12 | Ht 61.0 in | Wt 201.4 lb

## 2018-08-31 DIAGNOSIS — M25561 Pain in right knee: Secondary | ICD-10-CM | POA: Diagnosis not present

## 2018-08-31 DIAGNOSIS — M1711 Unilateral primary osteoarthritis, right knee: Secondary | ICD-10-CM | POA: Diagnosis not present

## 2018-08-31 DIAGNOSIS — K8020 Calculus of gallbladder without cholecystitis without obstruction: Secondary | ICD-10-CM | POA: Diagnosis not present

## 2018-08-31 NOTE — Progress Notes (Signed)
Patient ID: Sierra Moore, female   DOB: 09/15/1970, 48 y.o.   MRN: [id number]  HPI Sierra Moore is a 48 y.o. female seen in consultation at the request of Dr. Mariea Clonts.  She recently went to the emergency room for right upper quadrant pain and nausea.  She reports that she had that pain for a few days after eating fatty meals.  The pain was sharp moderate in intensity and radiated to the back.  There was no specific alleviating factors.  She denies any fevers any chills any biliary obstruction or any jaundice.  Go to the emergency room where she was treated and now her symptoms have resolved.  She does have some intermittent pains in the right upper quadrant that only last for a few minutes and are usually triggered by fatty meals.   Personally reviewed her right upper quadrant ultrasound showing evidence of gallstones normal common bile duct actionable adenomyomatosis of the gallbladder.  I did also review her CT scan personally showing no evidence of any major acute abnormalities other than some non obstructing kidney stones I can at that time was 15.6 with a normal H&H and platelets of 435,000.  CMP was completely normal.. She is able to perform more than 4 METS of activity without any shortness of breath or chest pain.  No prior abdominal operations  HPI  Past Medical History:  Diagnosis Date  . Allergy   . GERD (gastroesophageal reflux disease)   . Leg swelling   . Obesity   . PMDD (premenstrual dysphoric disorder)   . Vitamin D deficiency     Past Surgical History:  Procedure Laterality Date  . TONSILLECTOMY      Family History  Problem Relation Age of Onset  . Arthritis Mother   . Raynaud syndrome Mother   . Arthritis Father   . Breast cancer Neg Hx   . Bladder Cancer Neg Hx   . Kidney cancer Neg Hx     Social History Social History   Tobacco Use  . Smoking status: Never Smoker  . Smokeless tobacco: Never Used  Substance Use Topics  . Alcohol use: No  . Drug  use: No    Allergies  Allergen Reactions  . Penicillins     Current Outpatient Medications  Medication Sig Dispense Refill  . LESSINA-28 0.1-20 MG-MCG tablet TAKE 1 TABLET BY MOUTH DAILY CONTINUOUSLY 84 tablet 4  . meloxicam (MOBIC) 15 MG tablet Take by mouth.    . ondansetron (ZOFRAN ODT) 4 MG disintegrating tablet Take 1 tablet (4 mg total) by mouth every 8 (eight) hours as needed. 10 tablet 0  . terconazole (TERAZOL 7) 0.4 % vaginal cream Place 1 applicator vaginally at bedtime. 45 g 0  . Vitamin D, Ergocalciferol, (DRISDOL) 50000 units CAPS capsule Take 1 capsule (50,000 Units total) by mouth every 7 (seven) days. 30 capsule 1   No current facility-administered medications for this visit.      Review of Systems Full ROS  was asked and was negative except for the information on the HPI  Physical Exam Blood pressure 132/82, pulse 77, temperature 97.7 F (36.5 C), temperature source Temporal, resp. rate 12, height 5\' 1"  (1.549 m), weight 201 lb 6.4 oz (91.4 kg), SpO2 97 %. CONSTITUTIONAL: NAD, BMI 38 EYES: Pupils are equal, round, and reactive to light, Sclera are non-icteric. EARS, NOSE, MOUTH AND THROAT: The oropharynx is clear. The oral mucosa is pink and moist. Hearing is intact to voice. LYMPH NODES:  Lymph  nodes in the neck are normal. RESPIRATORY:  Lungs are clear. There is normal respiratory effort, with equal breath sounds bilaterally, and without pathologic use of accessory muscles. CARDIOVASCULAR: Heart is regular without murmurs, gallops, or rubs. GI: The abdomen is soft, nontender, and nondistended. There are no palpable masses. There is no hepatosplenomegaly. There are normal bowel sounds in all quadrants. GU: Rectal deferred.   MUSCULOSKELETAL: Normal muscle strength and tone. No cyanosis or edema.   SKIN: Turgor is good and there are no pathologic skin lesions or ulcers. NEUROLOGIC: Motor and sensation is grossly normal. Cranial nerves are grossly intact. PSYCH:   Oriented to person, place and time. Affect is normal.  Data Reviewed  I have personally reviewed the patient's imaging, laboratory findings and medical records.    Assessment/Plan 48 year old female  BMI 38with classic signs and symptoms consistent with biliary colic/early cholecystitis. I have discussed with the patient in detail about her disease process and definitely recommend cholecystectomy.  I do think that she is a very good candidate for robotic approach. The risks, benefits, complications, treatment options, and expected outcomes were discussed with the patient. The possibilities of bleeding, recurrent infection, finding a normal gallbladder, perforation of viscus organs, damage to surrounding structures, bile leak, abscess formation, needing a drain placed, the need for additional procedures, reaction to medication, pulmonary aspiration,  failure to diagnose a condition, the possible need to convert to an open procedure, and creating a complication requiring transfusion or operation were discussed with the patient. The patient and/or family concurred with the proposed plan, giving informed consent.  A copy of this report was sent to the referring provider  Caroleen Hamman, MD FACS General Surgeon 08/31/2018, 3:17 PM

## 2018-08-31 NOTE — Progress Notes (Signed)
Patient's surgery to be scheduled for 10-06-18 at [facility] with Dr. Dahlia Byes. This is per patient's request with already scheduled time of the week of 10-10-18. History and physical will be updated the morning of procedure. No pre-op visit with Dr. Dahlia Byes required.   The patient is aware she will be contacted by the [facility] to complete a phone interview sometime in the near future.  The patient is aware to call the office should she have further questions.

## 2018-08-31 NOTE — Patient Instructions (Signed)
You have requested to have your gallbladder removed. This will be done  at [facility] with [place].  You will most likely be out of work 1-2 weeks for this surgery. You will return after your post-op appointment with a lifting restriction for approximately 4 more weeks.  You will be able to eat anything you would like to following surgery. But, start by eating a bland diet and advance this as tolerated. The Gallbladder diet is below, please go as closely by this diet as possible prior to surgery to avoid any further attacks.  Please see the (blue)pre-care form that you have been given today. If you have any questions, please call our office.  Laparoscopic Cholecystectomy Laparoscopic cholecystectomy is surgery to remove the gallbladder. The gallbladder is located in the upper right part of the abdomen, behind the liver. It is a storage sac for bile, which is produced in the liver. Bile aids in the digestion and absorption of fats. Cholecystectomy is often done for inflammation of the gallbladder (cholecystitis). This condition is usually caused by a buildup of gallstones (cholelithiasis) in the gallbladder. Gallstones can block the flow of bile, and that can result in inflammation and pain. In severe cases, emergency surgery may be required. If emergency surgery is not required, you will have time to prepare for the procedure. Laparoscopic surgery is an alternative to open surgery. Laparoscopic surgery has a shorter recovery time. Your common bile duct may also need to be examined during the procedure. If stones are found in the common bile duct, they may be removed. LET [facility] CARE PROVIDER KNOW ABOUT:  Any allergies you have.  All medicines you are taking, including vitamins, herbs, eye drops, creams, and over-the-counter medicines.  Previous problems you or members of your family have had with the use of anesthetics.  Any blood disorders you have.  Previous surgeries you  have had.    Any medical conditions you have. RISKS AND COMPLICATIONS Generally, this is a safe procedure. However, problems may occur, including:  Infection.  Bleeding.  Allergic reactions to medicines.  Damage to other structures or organs.  A stone remaining in the common bile duct.  A bile leak from the cyst duct that is clipped when your gallbladder is removed.  The need to convert to open surgery, which requires a larger incision in the abdomen. This may be necessary if your surgeon thinks that it is not safe to continue with a laparoscopic procedure. BEFORE THE PROCEDURE  Ask your health care provider about:  Changing or stopping your regular medicines. This is especially important if you are taking diabetes medicines or blood thinners.  Taking medicines such as aspirin and ibuprofen. These medicines can thin your blood. Do not take these medicines before your procedure if your health care provider instructs you not to.  Follow instructions from your health care provider about eating or drinking restrictions.  Let your health care provider know if you develop a cold or an infection before surgery.  Plan to have someone take you home after the procedure.  Ask your health care provider how your surgical site will be marked or identified.  You may be given antibiotic medicine to help prevent infection. PROCEDURE  To reduce your risk of infection:  Your health care team will wash or sanitize their hands.  Your skin will be washed with soap.  An IV tube may be inserted into one of your veins.  You will be given a medicine to make you  fall asleep (general anesthetic).  A breathing tube will be placed in your mouth.  The surgeon will make several small cuts (incisions) in your abdomen.  A thin, lighted tube (laparoscope) that has a tiny camera on the end will be inserted through one of the small incisions. The camera on the laparoscope will send a picture to a TV  screen (monitor) in the operating room. This will give the surgeon a good view inside your abdomen.  A gas will be pumped into your abdomen. This will expand your abdomen to give the surgeon more room to perform the surgery.  Other tools that are needed for the procedure will be inserted through the other incisions. The gallbladder will be removed through one of the incisions.  After your gallbladder has been removed, the incisions will be closed with stitches (sutures), staples, or skin glue.  Your incisions may be covered with a bandage (dressing). The procedure may vary among health care providers and hospitals. AFTER THE PROCEDURE  Your blood pressure, heart rate, breathing rate, and blood oxygen level will be monitored often until the medicines you were given have worn off.  You will be given medicines as needed to control your pain.   This information is not intended to replace advice given to you by your health care provider. Make sure you discuss any questions you have with your health care provider.   Document Released: 06/22/2005 Document Revised: 03/13/2015 Document Reviewed: 02/01/2013 Elsevier Interactive Patient Education 2016 [place] Diet for Gallbladder Conditions A low-fat diet can be helpful if you have pancreatitis or a gallbladder condition. With these conditions, your pancreas and gallbladder have trouble digesting fats. A healthy eating plan with less fat will help rest your pancreas and gallbladder and reduce your symptoms. WHAT DO I NEED TO KNOW ABOUT THIS DIET?  Eat a low-fat diet.  Reduce your fat intake to less than 20-30% of your total daily calories. This is less than 50-60 g of fat per day.  Remember that you need some fat in your diet. Ask your dietician what your daily goal should be.  Choose nonfat and low-fat healthy foods. Look for the words "nonfat," "low fat," or "fat free."  As a guide, look on the label and choose foods with  less than 3 g of fat per serving. Eat only one serving.  Avoid alcohol.  Do not smoke. If you need help quitting, talk with your health care provider.  Eat small frequent meals instead of three large heavy meals. WHAT FOODS CAN I EAT? Grains Include healthy grains and starches such as potatoes, wheat bread, fiber-rich cereal, and brown rice. Choose whole grain options whenever possible. In adults, whole grains should account for 45-65% of your daily calories.  Fruits and Vegetables Eat plenty of fruits and vegetables. Fresh fruits and vegetables add fiber to your diet. Meats and Other Protein Sources Eat lean meat such as chicken and pork. Trim any fat off of meat before cooking it. Eggs, fish, and beans are other sources of protein. In adults, these foods should account for 10-35% of your daily calories. Dairy Choose low-fat milk and dairy options. Dairy includes fat and protein, as well as calcium.  Fats and Oils Limit high-fat foods such as fried foods, sweets, baked goods, sugary drinks.  Other Creamy sauces and condiments, such as mayonnaise, can add extra fat. Think about whether or not you need to use them, or use smaller amounts or low fat options. WHAT  FOODS ARE NOT RECOMMENDED?  High fat foods, such as:  [employer].  Ice cream.  [place] toast.  Sweet rolls.  Pizza.  Cheese bread.  Foods covered with batter, butter, creamy sauces, or cheese.  Fried foods.  Sugary drinks and desserts.  Foods that cause gas or bloating   This information is not intended to replace advice given to you by your health care provider. Make sure you discuss any questions you have with your health care provider.   Document Released: 06/27/2013 Document Reviewed: 06/27/2013 Elsevier Interactive Patient Education [employer].        Cholelithiasis  Cholelithiasis is also called "gallstones." It is a kind of gallbladder disease. The gallbladder is an organ that stores a  liquid (bile) that helps you digest fat. Gallstones may not cause symptoms (may be silent gallstones) until they cause a blockage, and then they can cause pain (gallbladder attack). Follow these instructions at home:  Take over-the-counter and prescription medicines only as told by your doctor.  Stay at a healthy weight.  Eat healthy foods. This includes: ? Eating fewer fatty foods, like fried foods. ? Eating fewer refined carbs (refined carbohydrates). Refined carbs are breads and grains that are highly processed, like white bread and white rice. Instead, choose whole grains like whole-wheat bread and brown rice. ? Eating more fiber. Almonds, fresh fruit, and beans are healthy sources of fiber.  Keep all follow-up visits as told by your doctor. This is important. Contact a doctor if:  You have sudden pain in the upper right side of your belly (abdomen). Pain might spread to your right shoulder or your chest. This may be a sign of a gallbladder attack.  You feel sick to your stomach (are nauseous).  You throw up (vomit).  You have been diagnosed with gallstones that have no symptoms and you get: ? Belly pain. ? Discomfort, burning, or fullness in the upper part of your belly (indigestion). Get help right away if:  You have sudden pain in the upper right side of your belly, and it lasts for more than 2 hours.  You have belly pain that lasts for more than 5 hours.  You have a fever or chills.  You keep feeling sick to your stomach or you keep throwing up.  Your skin or the whites of your eyes turn yellow (jaundice).  You have dark-colored pee (urine).  You have light-colored poop (stool). Summary  Cholelithiasis is also called "gallstones."  The gallbladder is an organ that stores a liquid (bile) that helps you digest fat.  Silent gallstones are gallstones that do not cause symptoms.  A gallbladder attack may cause sudden pain in the upper right side of your belly. Pain  might spread to your right shoulder or your chest. If this happens, contact your doctor.  If you have sudden pain in the upper right side of your belly that lasts for more than 2 hours, get help right away. This information is not intended to replace advice given to you by your health care provider. Make sure you discuss any questions you have with your health care provider. Document Released: 12/09/2007 Document Revised: 03/08/2016 Document Reviewed: 03/08/2016 Elsevier Interactive Patient Education  2019 [employer].

## 2018-08-31 NOTE — Addendum Note (Signed)
Addended by: Caroleen Hamman F on: 08/31/2018 03:53 PM   Modules accepted: Orders, SmartSet

## 2018-09-01 DIAGNOSIS — F411 Generalized anxiety disorder: Secondary | ICD-10-CM | POA: Diagnosis not present

## 2018-09-15 DIAGNOSIS — F411 Generalized anxiety disorder: Secondary | ICD-10-CM | POA: Diagnosis not present

## 2018-09-22 ENCOUNTER — Telehealth: Payer: Self-pay | Admitting: *Deleted

## 2018-09-22 NOTE — Telephone Encounter (Signed)
Patient contacted today and she states that she has not had any gallbladder issues since her last office visit.   Patient made aware that due to recent COVID-19 restrictions, elective surgeries will need to be postponed. Surgery for 10-06-18 with Dr. Dahlia Byes will be cancelled.   The patient will be contacted once restrictions have been lifted regarding rescheduling.   Patient verbalizes understanding.   The patient is also aware that should she start to develop symptoms in the meantime to call the office.

## 2018-09-26 ENCOUNTER — Inpatient Hospital Stay: Admission: RE | Admit: 2018-09-26 | Payer: Federal, State, Local not specified - PPO | Source: Ambulatory Visit

## 2018-10-06 ENCOUNTER — Ambulatory Visit
Admission: RE | Admit: 2018-10-06 | Payer: Federal, State, Local not specified - PPO | Source: Home / Self Care | Admitting: Surgery

## 2018-10-06 ENCOUNTER — Encounter: Admission: RE | Payer: Self-pay | Source: Home / Self Care

## 2018-10-06 SURGERY — ROBOTIC ASSISTED LAPAROSCOPIC CHOLECYSTECTOMY
Anesthesia: General

## 2018-11-14 ENCOUNTER — Telehealth: Payer: Self-pay | Admitting: *Deleted

## 2018-11-14 NOTE — Telephone Encounter (Signed)
Message left for patient to call the office.   We need to see if patient would like to have gallbladder surgery at [facility] since [facility] restrictions have not been lifted for elective cases at [facility] yet.   Pre-op visit will be required with Dr. Dahlia Byes.

## 2018-11-14 NOTE — Telephone Encounter (Signed)
Patient called the office back and notified per previous message.   She would like to wait and have surgery at [facility] once restrictions are lifted.   Patient aware she will be contacted once we can reschedule at [facility].

## 2018-11-23 DIAGNOSIS — M79671 Pain in right foot: Secondary | ICD-10-CM | POA: Diagnosis not present

## 2018-11-23 DIAGNOSIS — M1711 Unilateral primary osteoarthritis, right knee: Secondary | ICD-10-CM | POA: Diagnosis not present

## 2018-11-24 ENCOUNTER — Encounter: Payer: Federal, State, Local not specified - PPO | Admitting: Obstetrics and Gynecology

## 2018-12-07 ENCOUNTER — Ambulatory Visit (INDEPENDENT_AMBULATORY_CARE_PROVIDER_SITE_OTHER): Payer: Federal, State, Local not specified - PPO | Admitting: Family

## 2018-12-07 ENCOUNTER — Telehealth: Payer: Self-pay

## 2018-12-07 ENCOUNTER — Encounter: Payer: Self-pay | Admitting: Family

## 2018-12-07 ENCOUNTER — Other Ambulatory Visit: Payer: Self-pay

## 2018-12-07 DIAGNOSIS — E559 Vitamin D deficiency, unspecified: Secondary | ICD-10-CM

## 2018-12-07 DIAGNOSIS — M79671 Pain in right foot: Secondary | ICD-10-CM | POA: Diagnosis not present

## 2018-12-07 DIAGNOSIS — R319 Hematuria, unspecified: Secondary | ICD-10-CM

## 2018-12-07 NOTE — Progress Notes (Signed)
This visit type was conducted due to national recommendations for restrictions regarding the COVID-19 pandemic (e.g. social distancing).  This format is felt to be most appropriate for this patient at this time.  All issues noted in this document were discussed and addressed.  No physical exam was performed (except for noted visual exam findings with Video Visits). Virtual Visit via Video Note  I connected with@  on 12/07/18 at 11:30 AM EDT by a video enabled telemedicine application and verified that I am speaking with the correct person using two identifiers.  Location patient: home Location provider:work Persons participating in the virtual visit: patient, provider  I discussed the limitations of evaluation and management by telemedicine and the availability of in person appointments. The patient expressed understanding and agreed to proceed.   HPI:  Multiple complaints. Last visit 01/2017.   H/o vitamin d deficiency- not on vitamin d 50000 units. Would like vitamin D checked.   Right knee and hip 'arthritis' per patient; has seen orthopedics, Dr Posey Pronto.  More recently right ankle pain, 'whole foot' pain,  and also burning under right foot., waxes and wanes for past 6 weeks.  She was seen by orthopedics and XR per patient normal. Suspected plantar fasciitis, tibial tendon insufficiency.   started on mobic with improvement particularly of knee pain. No calf swelling, calf pain, joint swelling, increased heat, redness. No injury. Has been stretching, icing with no relief of foot pain.    Has also seen Dr Meda Coffee for new pain 2017. Would like autoimmune testing for joint pain again.   No abdominal pain, fever, vomiting.  Planning to have gallbladder removed, post poned due to [address].   Chronic hematuria- states had bladder scan which was normal 3 years ago. Dr Erlene Quan. Declines urine studies.   Annual CPE with GYN.  ROS: See pertinent positives and negatives per HPI.  Past Medical History:   Diagnosis Date  . Allergy   . GERD (gastroesophageal reflux disease)   . Leg swelling   . Obesity   . PMDD (premenstrual dysphoric disorder)   . Vitamin D deficiency     Past Surgical History:  Procedure Laterality Date  . TONSILLECTOMY      Family History  Problem Relation Age of Onset  . Arthritis Mother   . Raynaud syndrome Mother   . Arthritis Father   . Breast cancer Neg Hx   . Bladder Cancer Neg Hx   . Kidney cancer Neg Hx     SOCIAL HX: never smoker   Current Outpatient Medications:  .  LESSINA-28 0.1-20 MG-MCG tablet, TAKE 1 TABLET BY MOUTH DAILY CONTINUOUSLY (Patient taking differently: Take 1 tablet by mouth daily. TAKE 1 TABLET BY MOUTH DAILY CONTINUOUSLY), Disp: 84 tablet, Rfl: 4 .  meloxicam (MOBIC) 15 MG tablet, Take 15 mg by mouth daily as needed for pain. , Disp: , Rfl:  .  ondansetron (ZOFRAN ODT) 4 MG disintegrating tablet, Take 1 tablet (4 mg total) by mouth every 8 (eight) hours as needed. (Patient not taking: Reported on 09/20/2018), Disp: 10 tablet, Rfl: 0 .  terconazole (TERAZOL 7) 0.4 % vaginal cream, Place 1 applicator vaginally at bedtime. (Patient not taking: Reported on 09/20/2018), Disp: 45 g, Rfl: 0 .  Vitamin D, Ergocalciferol, (DRISDOL) 50000 units CAPS capsule, Take 1 capsule (50,000 Units total) by mouth every 7 (seven) days. (Patient not taking: Reported on 09/20/2018), Disp: 30 capsule, Rfl: 1  EXAM:  VITALS per patient if applicable:  GENERAL: alert, oriented, appears well and  in no acute distress  HEENT: atraumatic, conjunttiva clear, no obvious abnormalities on inspection of external nose and ears  NECK: normal movements of the head and neck  LUNGS: on inspection no signs of respiratory distress, breathing rate appears normal, no obvious gross SOB, gasping or wheezing  CV: no obvious cyanosis  MS: moves all visible extremities without noticeable abnormality  PSYCH/NEURO: pleasant and cooperative, no obvious depression or anxiety,  speech and thought processing grossly intact  ASSESSMENT AND PLAN:  Discussed the following assessment and plan:  Right foot pain - Plan: Uric acid, ANA, C-reactive protein, CYCLIC CITRUL PEPTIDE ANTIBODY, IGG/IGA, Rheumatoid factor, Sedimentation rate, Ambulatory referral to Podiatry  Vitamin D deficiency - Plan: CBC with Differential/Platelet, Comprehensive metabolic panel, Hemoglobin A1c, Lipid panel, TSH, VITAMIN D 25 Hydroxy (Vit-D Deficiency, Fractures)  Hematuria, unspecified type  Problem List Items Addressed This Visit      Other   Vitamin D deficiency    Pending labs.       Relevant Orders   CBC with Differential/Platelet   Comprehensive metabolic panel   Hemoglobin A1c   Lipid panel   TSH   VITAMIN D 25 Hydroxy (Vit-D Deficiency, Fractures)   Hematuria    Advised however she politely declines urine studies, UA.      Right foot pain - Primary    Discussed limitation of telemedicine however working diagnosis would include letter fasciitis, muscular strain.  Conservative measures have really not offer much relief at this point patient.  Patient and I agree on consult podiatry.  Pending labs as well.      Relevant Orders   Uric acid   ANA   C-reactive protein   CYCLIC CITRUL PEPTIDE ANTIBODY, IGG/IGA   Rheumatoid factor   Sedimentation rate   Ambulatory referral to Podiatry        I discussed the assessment and treatment plan with the patient. The patient was provided an opportunity to ask questions and all were answered. The patient agreed with the plan and demonstrated an understanding of the instructions.   The patient was advised to call back or seek an in-person evaluation if the symptoms worsen or if the condition fails to improve as anticipated.   Mable Paris, FNP

## 2018-12-07 NOTE — Assessment & Plan Note (Signed)
Pending labs

## 2018-12-07 NOTE — Assessment & Plan Note (Signed)
Discussed limitation of telemedicine however working diagnosis would include letter fasciitis, muscular strain.  Conservative measures have really not offer much relief at this point patient.  Patient and I agree on consult podiatry.  Pending labs as well.

## 2018-12-07 NOTE — Assessment & Plan Note (Addendum)
Advised however she politely declines urine studies, UA.

## 2018-12-07 NOTE — Patient Instructions (Addendum)
Lab appointment  For bone health, you need adequate vitamin D, and I recommend you supplement as it is harder to do so with diet alone. I recommend cholecalciferol 800 units daily.  Also, please ensure you are following a diet high in calcium -- research shows better outcomes with dietary sources including kale, yogurt, broccolii, cheese, okra, almonds- to name a few.    Today we discussed referrals, orders.   I have placed these orders in the system for you.  Please be sure to give [place] a call if you have not heard from our office regarding this. We should hear from [place] within ONE week with information regarding your appointment. If not, please let me know immediately.

## 2018-12-07 NOTE — Telephone Encounter (Signed)
Copied from [place] [phone]. Topic: Appointment Scheduling - Scheduling Inquiry for Clinic >> Dec 07, 2018 12:53 PM Margot Ables wrote: Reason for CRM: pt called to schedule fasting labs. Tried calling office and was put directly on hold multiple times. Pt holding 6 min. Please call to schedule.

## 2018-12-09 ENCOUNTER — Other Ambulatory Visit: Payer: Self-pay | Admitting: *Deleted

## 2018-12-09 ENCOUNTER — Telehealth: Payer: Self-pay | Admitting: Obstetrics and Gynecology

## 2018-12-09 MED ORDER — LESSINA 0.1-20 MG-MCG PO TABS: ORAL_TABLET | ORAL | 4 refills | Status: DC

## 2018-12-09 NOTE — Telephone Encounter (Signed)
Done-ac 

## 2018-12-09 NOTE — Telephone Encounter (Signed)
It appears patient got through to schedule labs for 6/11.

## 2018-12-09 NOTE — Telephone Encounter (Signed)
The patient called and stated that she needs a medication refill of LESSINA-28 0.1-20 MG-MCG tablet, the patient also stated that she had to reschedule her appointment out to September due to covid-19. Please advise.

## 2018-12-15 ENCOUNTER — Other Ambulatory Visit (INDEPENDENT_AMBULATORY_CARE_PROVIDER_SITE_OTHER): Payer: Federal, State, Local not specified - PPO

## 2018-12-15 ENCOUNTER — Other Ambulatory Visit: Payer: Self-pay

## 2018-12-15 DIAGNOSIS — E559 Vitamin D deficiency, unspecified: Secondary | ICD-10-CM

## 2018-12-15 DIAGNOSIS — M79671 Pain in right foot: Secondary | ICD-10-CM

## 2018-12-15 LAB — CBC WITH DIFFERENTIAL/PLATELET
Basophils Absolute: 0.1 10*3/uL (ref 0.0–0.1)
Basophils Relative: 0.7 % (ref 0.0–3.0)
Eosinophils Absolute: 0.2 10*3/uL (ref 0.0–0.7)
Eosinophils Relative: 1.8 % (ref 0.0–5.0)
HCT: 43.3 % (ref 36.0–46.0)
Hemoglobin: 13.9 g/dL (ref 12.0–15.0)
Lymphocytes Relative: 23.1 % (ref 12.0–46.0)
Lymphs Abs: 2.7 10*3/uL (ref 0.7–4.0)
MCHC: 32.2 g/dL (ref 30.0–36.0)
MCV: 80.1 fl (ref 78.0–100.0)
Monocytes Absolute: 0.7 10*3/uL (ref 0.1–1.0)
Monocytes Relative: 6.2 % (ref 3.0–12.0)
Neutro Abs: 8 10*3/uL — ABNORMAL HIGH (ref 1.4–7.7)
Neutrophils Relative %: 68.2 % (ref 43.0–77.0)
Platelets: 381 10*3/uL (ref 150.0–400.0)
RBC: 5.4 Mil/uL — ABNORMAL HIGH (ref 3.87–5.11)
RDW: 14.7 % (ref 11.5–15.5)
WBC: 11.8 10*3/uL — ABNORMAL HIGH (ref 4.0–10.5)

## 2018-12-15 LAB — COMPREHENSIVE METABOLIC PANEL
ALT: 8 U/L (ref 0–35)
AST: 10 U/L (ref 0–37)
Albumin: 3.7 g/dL (ref 3.5–5.2)
Alkaline Phosphatase: 105 U/L (ref 39–117)
BUN: 14 mg/dL (ref 6–23)
CO2: 26 mEq/L (ref 19–32)
Calcium: 8.8 mg/dL (ref 8.4–10.5)
Chloride: 104 mEq/L (ref 96–112)
Creatinine, Ser: 0.8 mg/dL (ref 0.40–1.20)
GFR: 76.1 mL/min (ref >60.00)
Glucose, Bld: 103 mg/dL — ABNORMAL HIGH (ref 70–99)
Potassium: 4.7 mEq/L (ref 3.5–5.1)
Sodium: 138 mEq/L (ref 135–145)
Total Bilirubin: 0.4 mg/dL (ref 0.2–1.2)
Total Protein: 6.2 g/dL (ref 6.0–8.3)

## 2018-12-15 LAB — LIPID PANEL
Cholesterol: 174 mg/dL (ref 0–200)
HDL: 37.8 mg/dL — ABNORMAL LOW (ref >39.00)
LDL Cholesterol: 114 mg/dL — ABNORMAL HIGH (ref 0–99)
NonHDL: 136.11
Total CHOL/HDL Ratio: 5
Triglycerides: 110 mg/dL (ref 0.0–149.0)
VLDL: 22 mg/dL (ref 0.0–40.0)

## 2018-12-15 LAB — SEDIMENTATION RATE: Sed Rate: 41 mm/hr — ABNORMAL HIGH (ref 0–20)

## 2018-12-15 LAB — TSH: TSH: 2.24 u[IU]/mL (ref 0.35–4.50)

## 2018-12-15 LAB — URIC ACID: Uric Acid, Serum: 4.5 mg/dL (ref 2.4–7.0)

## 2018-12-15 LAB — VITAMIN D 25 HYDROXY (VIT D DEFICIENCY, FRACTURES): VITD: 32.68 ng/mL (ref 30.00–100.00)

## 2018-12-15 LAB — HEMOGLOBIN A1C: Hgb A1c MFr Bld: 6.6 % — ABNORMAL HIGH (ref 4.6–6.5)

## 2018-12-15 LAB — C-REACTIVE PROTEIN: CRP: 2.2 mg/dL (ref 0.5–20.0)

## 2018-12-16 ENCOUNTER — Other Ambulatory Visit: Payer: Self-pay | Admitting: Family

## 2018-12-16 DIAGNOSIS — R899 Unspecified abnormal finding in specimens from other organs, systems and tissues: Secondary | ICD-10-CM

## 2018-12-17 LAB — CYCLIC CITRUL PEPTIDE ANTIBODY, IGG/IGA: Cyclic Citrullin Peptide Ab: 5 units (ref 0–19)

## 2018-12-21 ENCOUNTER — Encounter: Payer: Self-pay | Admitting: Family

## 2018-12-21 ENCOUNTER — Other Ambulatory Visit: Payer: Self-pay

## 2018-12-21 ENCOUNTER — Ambulatory Visit (INDEPENDENT_AMBULATORY_CARE_PROVIDER_SITE_OTHER): Payer: Federal, State, Local not specified - PPO | Admitting: Family

## 2018-12-21 DIAGNOSIS — E785 Hyperlipidemia, unspecified: Secondary | ICD-10-CM

## 2018-12-21 DIAGNOSIS — M79671 Pain in right foot: Secondary | ICD-10-CM

## 2018-12-21 DIAGNOSIS — E119 Type 2 diabetes mellitus without complications: Secondary | ICD-10-CM

## 2018-12-21 LAB — ANA: Anti Nuclear Antibody (ANA): NEGATIVE

## 2018-12-21 LAB — RHEUMATOID FACTOR: Rheumatoid fact SerPl-aCnc: <14 IU/mL (ref <14)

## 2018-12-21 NOTE — Assessment & Plan Note (Addendum)
Declines metformin. Would like to pursue lifestyle. F/u 3 months.

## 2018-12-21 NOTE — Assessment & Plan Note (Signed)
Declines statin therapy. LDL > 70. Will follow in 3 months.

## 2018-12-21 NOTE — Assessment & Plan Note (Signed)
Persistent. Pending podiatry and now rheumatology consult.  Will follow.

## 2018-12-21 NOTE — Patient Instructions (Addendum)
Today we discussed referrals, orders. Podiatry, rheumatology.     I have placed these orders in the system for you.  Please be sure to give [place] a call if you have not heard from our office regarding this. We should hear from [place] within ONE week with information regarding your appointment. If not, please let me know immediately.   Repeat labs as scheduled.   Will consider sleep apnea testing in future.

## 2018-12-21 NOTE — Progress Notes (Signed)
This visit type was conducted due to national recommendations for restrictions regarding the COVID-19 pandemic (e.g. social distancing).  This format is felt to be most appropriate for this patient at this time.  All issues noted in this document were discussed and addressed.  No physical exam was performed (except for noted visual exam findings with Video Visits). Virtual Visit via Video Note  I connected with@  on 12/21/18 at  2:00 PM EDT by a video enabled telemedicine application and verified that I am speaking with the correct person using two identifiers.  Location patient: home Location provider:work  Persons participating in the virtual visit: patient, provider  I discussed the limitations of evaluation and management by telemedicine and the availability of in person appointments. The patient expressed understanding and agreed to proceed.   HPI: Feels well. No new concerns.   DM- working on diet, and weight loss at home.   Right foot pain- waxes and waned. Worse over past 2 days since a lot of walking. Awaiting on podiatry. Following with orthopedics. Has done PT twice. She would like to rheumatology.  Hip XR 03/2018, right knee Xr 08/2018, right foot 11/2018.   Elevated rbcs. Unsure if snores. Declines evaluation for OSA at this time. Non smoker Carbon monoxide detectors at home.   ROS: See pertinent positives and negatives per HPI.  Past Medical History:  Diagnosis Date  . Allergy   . GERD (gastroesophageal reflux disease)   . Leg swelling   . Obesity   . PMDD (premenstrual dysphoric disorder)   . Vitamin D deficiency     Past Surgical History:  Procedure Laterality Date  . TONSILLECTOMY      Family History  Problem Relation Age of Onset  . Arthritis Mother   . Raynaud syndrome Mother   . Arthritis Father   . Breast cancer Neg Hx   . Bladder Cancer Neg Hx   . Kidney cancer Neg Hx     SOCIAL HX: non smoker   Current Outpatient Medications:  .  LESSINA-28  0.1-20 MG-MCG tablet, TAKE 1 TABLET BY MOUTH DAILY CONTINUOUSLY, Disp: 84 tablet, Rfl: 4 .  meloxicam (MOBIC) 15 MG tablet, Take 15 mg by mouth daily as needed for pain. , Disp: , Rfl:  .  ondansetron (ZOFRAN ODT) 4 MG disintegrating tablet, Take 1 tablet (4 mg total) by mouth every 8 (eight) hours as needed. (Patient not taking: Reported on 09/20/2018), Disp: 10 tablet, Rfl: 0 .  terconazole (TERAZOL 7) 0.4 % vaginal cream, Place 1 applicator vaginally at bedtime. (Patient not taking: Reported on 09/20/2018), Disp: 45 g, Rfl: 0 .  Vitamin D, Ergocalciferol, (DRISDOL) 50000 units CAPS capsule, Take 1 capsule (50,000 Units total) by mouth every 7 (seven) days. (Patient not taking: Reported on 09/20/2018), Disp: 30 capsule, Rfl: 1  EXAM:  VITALS per patient if applicable:  GENERAL: alert, oriented, appears well and in no acute distress  HEENT: atraumatic, conjunttiva clear, no obvious abnormalities on inspection of external nose and ears  NECK: normal movements of the head and neck  LUNGS: on inspection no signs of respiratory distress, breathing rate appears normal, no obvious gross SOB, gasping or wheezing  CV: no obvious cyanosis  MS: moves all visible extremities without noticeable abnormality  PSYCH/NEURO: pleasant and cooperative, no obvious depression or anxiety, speech and thought processing grossly intact  ASSESSMENT AND PLAN:  Discussed the following assessment and plan: Problem List Items Addressed This Visit      Endocrine   DM (diabetes  mellitus) ([place])    Declines metformin. Would like to pursue lifestyle. F/u 3 months.         Other   Right foot pain - Primary    Persistent. Pending podiatry and now rheumatology consult.  Will follow.       Relevant Orders   Ambulatory referral to Rheumatology   HLD (hyperlipidemia)    Declines statin therapy. LDL > 70. Will follow in 3 months.            I discussed the assessment and treatment plan with the patient. The  patient was provided an opportunity to ask questions and all were answered. The patient agreed with the plan and demonstrated an understanding of the instructions.   The patient was advised to call back or seek an in-person evaluation if the symptoms worsen or if the condition fails to improve as anticipated.   Mable Paris, FNP

## 2018-12-23 ENCOUNTER — Ambulatory Visit (INDEPENDENT_AMBULATORY_CARE_PROVIDER_SITE_OTHER): Payer: Federal, State, Local not specified - PPO | Admitting: Family

## 2018-12-23 ENCOUNTER — Other Ambulatory Visit: Payer: Self-pay

## 2018-12-23 ENCOUNTER — Other Ambulatory Visit: Payer: Self-pay | Admitting: Family

## 2018-12-23 DIAGNOSIS — R899 Unspecified abnormal finding in specimens from other organs, systems and tissues: Secondary | ICD-10-CM | POA: Diagnosis not present

## 2018-12-23 LAB — CBC WITH DIFFERENTIAL/PLATELET
Basophils Absolute: 0.1 10*3/uL (ref 0.0–0.1)
Basophils Relative: 0.6 % (ref 0.0–3.0)
Eosinophils Absolute: 0.2 10*3/uL (ref 0.0–0.7)
Eosinophils Relative: 1.5 % (ref 0.0–5.0)
HCT: 44.4 % (ref 36.0–46.0)
Hemoglobin: 14.4 g/dL (ref 12.0–15.0)
Lymphocytes Relative: 22.1 % (ref 12.0–46.0)
Lymphs Abs: 3.1 10*3/uL (ref 0.7–4.0)
MCHC: 32.4 g/dL (ref 30.0–36.0)
MCV: 80.5 fl (ref 78.0–100.0)
Monocytes Absolute: 0.8 10*3/uL (ref 0.1–1.0)
Monocytes Relative: 5.6 % (ref 3.0–12.0)
Neutro Abs: 10 10*3/uL — ABNORMAL HIGH (ref 1.4–7.7)
Neutrophils Relative %: 70.2 % (ref 43.0–77.0)
Platelets: 435 10*3/uL — ABNORMAL HIGH (ref 150.0–400.0)
RBC: 5.51 Mil/uL — ABNORMAL HIGH (ref 3.87–5.11)
RDW: 15 % (ref 11.5–15.5)
WBC: 14.2 10*3/uL — ABNORMAL HIGH (ref 4.0–10.5)

## 2018-12-23 LAB — SEDIMENTATION RATE: Sed Rate: 8 mm/hr (ref 0–20)

## 2018-12-23 NOTE — Progress Notes (Signed)
close

## 2018-12-24 ENCOUNTER — Telehealth: Payer: Self-pay | Admitting: *Deleted

## 2018-12-24 NOTE — Telephone Encounter (Signed)
Patient contacted today about getting surgery scheduled. This was postponed due to COVID-19.  Patient's surgery to be scheduled for 01-10-19 at [facility] with Dr. Dahlia Byes.  The patient is aware to have COVID-19 testing done on 01-06-19 at the [place] building drive thru ([address] [place]) between 8:00 am and 10:30 am. She is aware to isolate after, have no visitors, wash hands frequently, and avoid touching face.   The patient is aware she will be contacted by the [facility] to complete a phone interview sometime in the near future.  Patient aware to be NPO after midnight and have a driver.   She is aware to check in at the [facility] entrance where she will be screened for the coronavirus and then sent to Same Day Surgery.   Patient aware that she may have no visitors and driver will need to wait in the car due to COVID-19 restrictions.   Patient aware to call [facility] at [phone] between 1 and 3 pm the day prior to procedure to get arrival time.   The patient has been scheduled for a pre-op visit with Dr. Dahlia Byes on 01-04-19 at 11:30 am.  The patient verbalizes understanding of the above.   The patient is aware to call the office should she have further questions.

## 2018-12-26 DIAGNOSIS — F418 Other specified anxiety disorders: Secondary | ICD-10-CM | POA: Diagnosis not present

## 2018-12-26 DIAGNOSIS — F411 Generalized anxiety disorder: Secondary | ICD-10-CM | POA: Diagnosis not present

## 2018-12-27 ENCOUNTER — Telehealth: Payer: Self-pay

## 2018-12-27 DIAGNOSIS — M2550 Pain in unspecified joint: Secondary | ICD-10-CM | POA: Diagnosis not present

## 2018-12-27 DIAGNOSIS — B351 Tinea unguium: Secondary | ICD-10-CM | POA: Diagnosis not present

## 2018-12-27 DIAGNOSIS — I7300 Raynaud's syndrome without gangrene: Secondary | ICD-10-CM | POA: Diagnosis not present

## 2018-12-27 NOTE — Telephone Encounter (Signed)
Copied from [place] [id number]. Topic: General - Other >> Dec 27, 2018 11:23 AM Marin Olp L wrote: Reason for CRM: Patient would like a call back from Arnette to discuss her referral to hematology and her lab results. The hematology office called to schedule her but she wants to speak with Arnett first.

## 2018-12-28 ENCOUNTER — Other Ambulatory Visit: Payer: Self-pay

## 2018-12-28 DIAGNOSIS — R899 Unspecified abnormal finding in specimens from other organs, systems and tissues: Secondary | ICD-10-CM

## 2018-12-28 NOTE — Telephone Encounter (Signed)
noted 

## 2018-12-28 NOTE — Telephone Encounter (Signed)
I think you are saying rheumatologist told her to wait to see hematology until AFTER gallbladder surgery ? Please confirm I'm reading correctly.   I don't think unreasonable. Please advise repeat CBC late July. Please order CBC with diff and schedule .

## 2019-01-04 ENCOUNTER — Ambulatory Visit: Payer: Federal, State, Local not specified - PPO | Admitting: Surgery

## 2019-01-04 ENCOUNTER — Encounter
Admission: RE | Admit: 2019-01-04 | Discharge: 2019-01-04 | Disposition: A | Discharge status: Home / Self Care | Payer: Federal, State, Local not specified - PPO | Source: Ambulatory Visit | Attending: Surgery | Admitting: Surgery

## 2019-01-04 ENCOUNTER — Encounter: Payer: Self-pay | Admitting: Surgery

## 2019-01-04 ENCOUNTER — Other Ambulatory Visit: Payer: Self-pay

## 2019-01-04 VITALS — BP 137/84 | HR 105 | Temp 97.3°F | Resp 16 | Ht 61.0 in | Wt 202.2 lb

## 2019-01-04 DIAGNOSIS — K8020 Calculus of gallbladder without cholecystitis without obstruction: Secondary | ICD-10-CM

## 2019-01-04 HISTORY — DX: Unspecified osteoarthritis, unspecified site: M19.90

## 2019-01-04 HISTORY — DX: Raynaud's syndrome without gangrene: I73.00

## 2019-01-04 NOTE — Progress Notes (Signed)
Outpatient Surgical Follow Up  01/04/2019  Sierra Moore is an 48 y.o. female.   Chief Complaint  Patient presents with  . Pre-op Exam    Lap Cholecystectomy    HPI: 89-year-old female well-known to me with a history of biliary colic and ultrasound consistent with gallstones.  Normal CMP and also normal CT scan.  She continues to have intermittent right upper quadrant pain.  Pain with heavy meals.  No fevers no chills no evidence of biliary obstruction. I saw her 4 months ago and due to the covid epidemic we postponed her surgery  Past Medical History:  Diagnosis Date  . Allergy   . Arthritis    POLYARTHRALGIA  . GERD (gastroesophageal reflux disease)   . Leg swelling   . Obesity   . PMDD (premenstrual dysphoric disorder)   . PMDD (premenstrual dysphoric disorder)   . Raynaud disease   . Vitamin D deficiency     Past Surgical History:  Procedure Laterality Date  . TONSILLECTOMY      Family History  Problem Relation Age of Onset  . Arthritis Mother   . Raynaud syndrome Mother   . Arthritis Father   . Breast cancer Neg Hx   . Bladder Cancer Neg Hx   . Kidney cancer Neg Hx     Social History:  reports that she has never smoked. She has never used smokeless tobacco. She reports that she does not drink alcohol or use drugs.  Allergies:  Allergies  Allergen Reactions  . Penicillins Hives    Did it involve swelling of the face/tongue/throat, SOB, or low BP? No Did it involve sudden or severe rash/hives, skin peeling, or any reaction on the inside of your mouth or nose? No Did you need to seek medical attention at a hospital or doctor's office? No When did it last happen?within the last 5 years If all above answers are "NO", may proceed with cephalosporin use.     Medications reviewed.    ROS Full ROS performed and is otherwise negative other than what is stated in HPI   BP 137/84   Pulse (!) 105   Temp (!) 97.3 F (36.3 C) (Temporal)   Resp 16    Ht 5\' 1"  (1.549 m)   Wt 202 lb 3.2 oz (91.7 kg)   SpO2 95%   BMI 38.21 kg/m   Physical Exam Vitals signs and nursing note reviewed.  Constitutional:      Appearance: Normal appearance.  Eyes:     General:        Right eye: No discharge.        Left eye: No discharge.  Neck:     Musculoskeletal: Normal range of motion and neck supple.  Cardiovascular:     Rate and Rhythm: Normal rate.     Heart sounds: Normal heart sounds. No murmur. No gallop.   Pulmonary:     Effort: Pulmonary effort is normal. No respiratory distress.     Breath sounds: Normal breath sounds.  Abdominal:     General: Abdomen is flat. There is no distension.     Palpations: There is no mass.     Tenderness: There is no abdominal tenderness. There is no guarding or rebound.     Hernia: No hernia is present.  Musculoskeletal: Normal range of motion.        General: No swelling.  Skin:    General: Skin is warm and dry.     Capillary Refill: Capillary refill takes  less than 2 seconds.  Neurological:     General: No focal deficit present.     Mental Status: She is alert and oriented to person, place, and time.  Psychiatric:        Mood and Affect: Mood normal.        Behavior: Behavior normal.        Thought Content: Thought content normal.        Judgment: Judgment normal.       Assessment/Plan: 48 year old female with biliary colic.  Given symptoms I do recommend cholecystectomy.  I do think that she is a good candidate for robotic cholecystectomy. The risks, benefits, complications, treatment options, and expected outcomes were discussed with the patient. The possibilities of bleeding, recurrent infection, finding a normal gallbladder, perforation of viscus organs, damage to surrounding structures, bile leak, abscess formation, needing a drain placed, the need for additional procedures, reaction to medication, pulmonary aspiration,  failure to diagnose a condition, the possible need to convert to an open  procedure, and creating a complication requiring transfusion or operation were discussed with the patient. The patient and/or family concurred with the proposed plan, giving informed consent.   Caroleen Hamman, MD [facility] General Surgeon

## 2019-01-04 NOTE — Patient Instructions (Signed)
Your procedure is scheduled on: 01/10/19 Report to Day Surgery. [facility]OND FLOOR To find out your arrival time please call [phone] between 1PM - 3PM on 01/09/19.  Remember: Instructions that are not followed completely may result in serious medical risk,  up to and including death, or upon the discretion of your surgeon and anesthesiologist your  surgery may need to be rescheduled.     _X__ 1. Do not eat food after midnight the night before your procedure.                 No gum chewing or hard candies. You may drink clear liquids up to 2 hours                 before you are scheduled to arrive for your surgery- DO not drink clear                 liquids within 2 hours of the start of your surgery.                 Clear Liquids include:  water, apple juice without pulp, clear carbohydrate                 drink such as Clearfast of Gatorade, Black Coffee or Tea (Do not add                 anything to coffee or tea).  __X__2.  On the morning of surgery brush your teeth with toothpaste and water, you                may rinse your mouth with mouthwash if you wish.  Do not swallow any toothpaste of mouthwash.     _X__ 3.  No Alcohol for 24 hours before or after surgery.   _X__ 4.  Do Not Smoke or use e-cigarettes For 24 Hours Prior to Your Surgery.                 Do not use any chewable tobacco products for at least 6 hours prior to                 surgery.  ____  5.  Bring all medications with you on the day of surgery if instructed.   __X__  6.  Notify your doctor if there is any change in your medical condition      (cold, fever, infections).     Do not wear jewelry, make-up, hairpins, clips or nail polish. Do not wear lotions, powders, or perfumes. You may wear deodorant. Do not shave 48 hours prior to surgery. Men may shave face and neck. Do not bring valuables to the hospital.    [facility] is not responsible for any belongings or  valuables.  Contacts, dentures or bridgework may not be worn into surgery. Leave your suitcase in the car. After surgery it may be brought to your room. For patients admitted to the hospital, discharge time is determined by your treatment team.   Patients discharged the day of surgery will not be allowed to drive home.   Please read over the following fact sheets that you were given:   Surgical Site Infection Prevention          ____ Take these medicines the morning of surgery with A SIP OF WATER:    1. NONE  2.   3.   4.  5.  6.  ____ Fleet Enema (as directed)  __X__ Use CHG Soap as directed  ____ Use inhalers on the day of surgery  ____ Stop metformin 2 days prior to surgery    ____ Take 1/2 of usual insulin dose the night before surgery. No insulin the morning          of surgery.   ____ Stop Coumadin/Plavix/aspirin on   _X___ Stop Anti-inflammatories on   STOP MELOXICAM 01/04/19   ____ Stop supplements until after surgery.    ____ Bring C-Pap to the hospital.

## 2019-01-04 NOTE — Patient Instructions (Signed)

## 2019-01-04 NOTE — H&P (View-Only) (Signed)
Outpatient Surgical Follow Up  01/04/2019  Sierra Moore is an 48 y.o. female.   Chief Complaint  Patient presents with  . Pre-op Exam    Lap Cholecystectomy    HPI: 26-year-old female well-known to me with a history of biliary colic and ultrasound consistent with gallstones.  Normal CMP and also normal CT scan.  She continues to have intermittent right upper quadrant pain.  Pain with heavy meals.  No fevers no chills no evidence of biliary obstruction. I saw her 4 months ago and due to the covid epidemic we postponed her surgery  Past Medical History:  Diagnosis Date  . Allergy   . Arthritis    POLYARTHRALGIA  . GERD (gastroesophageal reflux disease)   . Leg swelling   . Obesity   . PMDD (premenstrual dysphoric disorder)   . PMDD (premenstrual dysphoric disorder)   . Raynaud disease   . Vitamin D deficiency     Past Surgical History:  Procedure Laterality Date  . TONSILLECTOMY      Family History  Problem Relation Age of Onset  . Arthritis Mother   . Raynaud syndrome Mother   . Arthritis Father   . Breast cancer Neg Hx   . Bladder Cancer Neg Hx   . Kidney cancer Neg Hx     Social History:  reports that she has never smoked. She has never used smokeless tobacco. She reports that she does not drink alcohol or use drugs.  Allergies:  Allergies  Allergen Reactions  . Penicillins Hives    Did it involve swelling of the face/tongue/throat, SOB, or low BP? No Did it involve sudden or severe rash/hives, skin peeling, or any reaction on the inside of your mouth or nose? No Did you need to seek medical attention at a hospital or doctor's office? No When did it last happen?within the last 5 years If all above answers are "NO", may proceed with cephalosporin use.     Medications reviewed.    ROS Full ROS performed and is otherwise negative other than what is stated in HPI   BP 137/84   Pulse (!) 105   Temp (!) 97.3 F (36.3 C) (Temporal)   Resp 16    Ht 5\' 1"  (1.549 m)   Wt 202 lb 3.2 oz (91.7 kg)   SpO2 95%   BMI 38.21 kg/m   Physical Exam Vitals signs and nursing note reviewed.  Constitutional:      Appearance: Normal appearance.  Eyes:     General:        Right eye: No discharge.        Left eye: No discharge.  Neck:     Musculoskeletal: Normal range of motion and neck supple.  Cardiovascular:     Rate and Rhythm: Normal rate.     Heart sounds: Normal heart sounds. No murmur. No gallop.   Pulmonary:     Effort: Pulmonary effort is normal. No respiratory distress.     Breath sounds: Normal breath sounds.  Abdominal:     General: Abdomen is flat. There is no distension.     Palpations: There is no mass.     Tenderness: There is no abdominal tenderness. There is no guarding or rebound.     Hernia: No hernia is present.  Musculoskeletal: Normal range of motion.        General: No swelling.  Skin:    General: Skin is warm and dry.     Capillary Refill: Capillary refill takes  less than 2 seconds.  Neurological:     General: No focal deficit present.     Mental Status: She is alert and oriented to person, place, and time.  Psychiatric:        Mood and Affect: Mood normal.        Behavior: Behavior normal.        Thought Content: Thought content normal.        Judgment: Judgment normal.       Assessment/Plan: 48 year old female with biliary colic.  Given symptoms I do recommend cholecystectomy.  I do think that she is a good candidate for robotic cholecystectomy. The risks, benefits, complications, treatment options, and expected outcomes were discussed with the patient. The possibilities of bleeding, recurrent infection, finding a normal gallbladder, perforation of viscus organs, damage to surrounding structures, bile leak, abscess formation, needing a drain placed, the need for additional procedures, reaction to medication, pulmonary aspiration,  failure to diagnose a condition, the possible need to convert to an open  procedure, and creating a complication requiring transfusion or operation were discussed with the patient. The patient and/or family concurred with the proposed plan, giving informed consent.   Caroleen Hamman, MD [facility] General Surgeon

## 2019-01-06 ENCOUNTER — Other Ambulatory Visit: Payer: Self-pay

## 2019-01-06 ENCOUNTER — Other Ambulatory Visit: Payer: Federal, State, Local not specified - PPO

## 2019-01-06 ENCOUNTER — Other Ambulatory Visit
Admission: RE | Admit: 2019-01-06 | Discharge: 2019-01-06 | Disposition: A | Discharge status: Home / Self Care | Payer: Federal, State, Local not specified - PPO | Source: Ambulatory Visit | Attending: Surgery | Admitting: Surgery

## 2019-01-06 DIAGNOSIS — N23 Unspecified renal colic: Secondary | ICD-10-CM | POA: Diagnosis not present

## 2019-01-06 DIAGNOSIS — Z01812 Encounter for preprocedural laboratory examination: Secondary | ICD-10-CM | POA: Diagnosis not present

## 2019-01-06 DIAGNOSIS — N390 Urinary tract infection, site not specified: Secondary | ICD-10-CM | POA: Diagnosis not present

## 2019-01-06 DIAGNOSIS — R350 Frequency of micturition: Secondary | ICD-10-CM | POA: Diagnosis not present

## 2019-01-06 DIAGNOSIS — Z1159 Encounter for screening for other viral diseases: Secondary | ICD-10-CM | POA: Insufficient documentation

## 2019-01-06 LAB — SARS CORONAVIRUS 2 (TAT 6-24 HRS): SARS Coronavirus 2: NEGATIVE

## 2019-01-09 ENCOUNTER — Telehealth: Payer: Self-pay | Admitting: *Deleted

## 2019-01-09 MED ORDER — CLINDAMYCIN PHOSPHATE 900 MG/50ML IV SOLN
900.0000 mg | INTRAVENOUS | Status: AC
  Administered 2019-01-10: 09:00:00 900 mg via INTRAVENOUS

## 2019-01-09 NOTE — Telephone Encounter (Signed)
Notified patient as instructed,covid Negative patient pleased. Discussed follow-up appointments, patient agrees

## 2019-01-10 ENCOUNTER — Encounter
Admission: RE | Disposition: A | Discharge status: Home / Self Care | Payer: Self-pay | Source: Home / Self Care | Attending: Surgery

## 2019-01-10 ENCOUNTER — Ambulatory Visit
Admission: RE | Admit: 2019-01-10 | Discharge: 2019-01-10 | Disposition: A | Discharge status: Home / Self Care | Payer: Federal, State, Local not specified - PPO | Attending: Surgery | Admitting: Surgery

## 2019-01-10 ENCOUNTER — Ambulatory Visit: Payer: Federal, State, Local not specified - PPO | Admitting: Anesthesiology

## 2019-01-10 ENCOUNTER — Other Ambulatory Visit: Payer: Self-pay

## 2019-01-10 ENCOUNTER — Encounter: Payer: Self-pay | Admitting: *Deleted

## 2019-01-10 DIAGNOSIS — K8010 Calculus of gallbladder with chronic cholecystitis without obstruction: Secondary | ICD-10-CM | POA: Diagnosis not present

## 2019-01-10 DIAGNOSIS — K219 Gastro-esophageal reflux disease without esophagitis: Secondary | ICD-10-CM | POA: Diagnosis not present

## 2019-01-10 DIAGNOSIS — E669 Obesity, unspecified: Secondary | ICD-10-CM | POA: Insufficient documentation

## 2019-01-10 DIAGNOSIS — Z6841 Body Mass Index (BMI) 40.0 and over, adult: Secondary | ICD-10-CM | POA: Insufficient documentation

## 2019-01-10 DIAGNOSIS — M1990 Unspecified osteoarthritis, unspecified site: Secondary | ICD-10-CM | POA: Insufficient documentation

## 2019-01-10 DIAGNOSIS — E119 Type 2 diabetes mellitus without complications: Secondary | ICD-10-CM | POA: Diagnosis not present

## 2019-01-10 DIAGNOSIS — K8020 Calculus of gallbladder without cholecystitis without obstruction: Secondary | ICD-10-CM

## 2019-01-10 DIAGNOSIS — K8050 Calculus of bile duct without cholangitis or cholecystitis without obstruction: Secondary | ICD-10-CM | POA: Diagnosis not present

## 2019-01-10 DIAGNOSIS — Z880 Allergy status to penicillin: Secondary | ICD-10-CM | POA: Diagnosis not present

## 2019-01-10 HISTORY — PX: ROBOTIC ASSISTED LAPAROSCOPIC CHOLECYSTECTOMY: SHX6521

## 2019-01-10 LAB — POCT PREGNANCY, URINE: Preg Test, Ur: NEGATIVE

## 2019-01-10 SURGERY — ROBOTIC ASSISTED LAPAROSCOPIC CHOLECYSTECTOMY
Anesthesia: General

## 2019-01-10 MED ORDER — FAMOTIDINE 20 MG PO TABS
ORAL_TABLET | ORAL | Status: AC
  Administered 2019-01-10: 20 mg via ORAL
  Filled 2019-01-10: qty 1

## 2019-01-10 MED ORDER — OXYCODONE HCL 5 MG/5ML PO SOLN: 5.0000 mg | Freq: Once | ORAL | Status: AC | PRN

## 2019-01-10 MED ORDER — OXYCODONE HCL 5 MG PO TABS
5.0000 mg | ORAL_TABLET | Freq: Once | ORAL | Status: AC | PRN
  Administered 2019-01-10: 5 mg via ORAL

## 2019-01-10 MED ORDER — LACTATED RINGERS IV SOLN
INTRAVENOUS | Status: DC
  Administered 2019-01-10: 09:00:00 via INTRAVENOUS

## 2019-01-10 MED ORDER — ONDANSETRON HCL 4 MG/2ML IJ SOLN
INTRAMUSCULAR | Status: AC
  Filled 2019-01-10: qty 2

## 2019-01-10 MED ORDER — FENTANYL CITRATE (PF) 100 MCG/2ML IJ SOLN
INTRAMUSCULAR | Status: AC
  Filled 2019-01-10: qty 2

## 2019-01-10 MED ORDER — BUPIVACAINE-EPINEPHRINE (PF) 0.25% -1:200000 IJ SOLN
INTRAMUSCULAR | Status: DC | PRN
  Administered 2019-01-10: 30 mL

## 2019-01-10 MED ORDER — MIDAZOLAM HCL 2 MG/2ML IJ SOLN
INTRAMUSCULAR | Status: DC | PRN
  Administered 2019-01-10: 2 mg via INTRAVENOUS

## 2019-01-10 MED ORDER — OXYCODONE HCL 5 MG PO TABS
ORAL_TABLET | ORAL | Status: AC
  Filled 2019-01-10: qty 1

## 2019-01-10 MED ORDER — LIDOCAINE HCL (CARDIAC) PF 100 MG/5ML IV SOSY
PREFILLED_SYRINGE | INTRAVENOUS | Status: DC | PRN
  Administered 2019-01-10: 100 mg via INTRAVENOUS

## 2019-01-10 MED ORDER — CELECOXIB 200 MG PO CAPS
ORAL_CAPSULE | ORAL | Status: AC
  Administered 2019-01-10: 200 mg via ORAL
  Filled 2019-01-10: qty 1

## 2019-01-10 MED ORDER — ONDANSETRON HCL 4 MG/2ML IJ SOLN
INTRAMUSCULAR | Status: DC | PRN
  Administered 2019-01-10: 4 mg via INTRAVENOUS

## 2019-01-10 MED ORDER — FAMOTIDINE 20 MG PO TABS
20.0000 mg | ORAL_TABLET | Freq: Once | ORAL | Status: AC
  Administered 2019-01-10: 09:00:00 20 mg via ORAL

## 2019-01-10 MED ORDER — CHLORHEXIDINE GLUCONATE CLOTH 2 % EX PADS: 6.0000 | MEDICATED_PAD | Freq: Once | CUTANEOUS | Status: DC

## 2019-01-10 MED ORDER — ROCURONIUM BROMIDE 100 MG/10ML IV SOLN
INTRAVENOUS | Status: DC | PRN
  Administered 2019-01-10: 50 mg via INTRAVENOUS

## 2019-01-10 MED ORDER — PROMETHAZINE HCL 25 MG/ML IJ SOLN: 6.2500 mg | INTRAMUSCULAR | Status: DC | PRN

## 2019-01-10 MED ORDER — GABAPENTIN 300 MG PO CAPS
ORAL_CAPSULE | ORAL | Status: AC
  Administered 2019-01-10: 300 mg via ORAL
  Filled 2019-01-10: qty 1

## 2019-01-10 MED ORDER — PROPOFOL 10 MG/ML IV BOLUS
INTRAVENOUS | Status: DC | PRN
  Administered 2019-01-10: 150 mg via INTRAVENOUS

## 2019-01-10 MED ORDER — FENTANYL CITRATE (PF) 100 MCG/2ML IJ SOLN
INTRAMUSCULAR | Status: DC | PRN
  Administered 2019-01-10 (×2): 50 ug via INTRAVENOUS

## 2019-01-10 MED ORDER — BUPIVACAINE-EPINEPHRINE (PF) 0.25% -1:200000 IJ SOLN
INTRAMUSCULAR | Status: AC
  Filled 2019-01-10: qty 30

## 2019-01-10 MED ORDER — SUGAMMADEX SODIUM 200 MG/2ML IV SOLN
INTRAVENOUS | Status: DC | PRN
  Administered 2019-01-10: 200 mg via INTRAVENOUS

## 2019-01-10 MED ORDER — PROMETHAZINE HCL 25 MG/ML IJ SOLN
INTRAMUSCULAR | Status: AC
  Filled 2019-01-10: qty 1

## 2019-01-10 MED ORDER — CELECOXIB 200 MG PO CAPS
200.0000 mg | ORAL_CAPSULE | ORAL | Status: AC
  Administered 2019-01-10: 09:00:00 200 mg via ORAL

## 2019-01-10 MED ORDER — CLINDAMYCIN PHOSPHATE 900 MG/50ML IV SOLN
INTRAVENOUS | Status: AC
  Filled 2019-01-10: qty 50

## 2019-01-10 MED ORDER — PROPOFOL 10 MG/ML IV BOLUS
INTRAVENOUS | Status: AC
  Filled 2019-01-10: qty 20

## 2019-01-10 MED ORDER — DEXAMETHASONE SODIUM PHOSPHATE 10 MG/ML IJ SOLN
INTRAMUSCULAR | Status: DC | PRN
  Administered 2019-01-10: 10 mg via INTRAVENOUS

## 2019-01-10 MED ORDER — ACETAMINOPHEN 500 MG PO TABS
ORAL_TABLET | ORAL | Status: AC
  Filled 2019-01-10: qty 2

## 2019-01-10 MED ORDER — ACETAMINOPHEN 500 MG PO TABS: 1000.0000 mg | ORAL_TABLET | ORAL | Status: DC

## 2019-01-10 MED ORDER — GABAPENTIN 300 MG PO CAPS
300.0000 mg | ORAL_CAPSULE | ORAL | Status: AC
  Administered 2019-01-10: 300 mg via ORAL

## 2019-01-10 MED ORDER — FENTANYL CITRATE (PF) 100 MCG/2ML IJ SOLN
25.0000 ug | INTRAMUSCULAR | Status: DC | PRN
  Administered 2019-01-10 (×4): 50 ug via INTRAVENOUS

## 2019-01-10 MED ORDER — HYDROCODONE-ACETAMINOPHEN 5-325 MG PO TABS: 1.0000 | ORAL_TABLET | Freq: Four times a day (QID) | ORAL | 0 refills | Status: DC | PRN

## 2019-01-10 MED ORDER — MIDAZOLAM HCL 2 MG/2ML IJ SOLN
INTRAMUSCULAR | Status: AC
  Filled 2019-01-10: qty 2

## 2019-01-10 SURGICAL SUPPLY — 42 items
CANISTER SUCT 1200ML W/VALVE (MISCELLANEOUS) ×2 IMPLANT
CHLORAPREP W/TINT 26 (MISCELLANEOUS) ×2 IMPLANT
CLIP VESOLOCK MED LG 6/CT (CLIP) ×3 IMPLANT
COVER WAND RF STERILE (DRAPES) ×2 IMPLANT
DECANTER SPIKE VIAL GLASS SM (MISCELLANEOUS) ×2 IMPLANT
DEFOGGER SCOPE WARMER CLEARIFY (MISCELLANEOUS) ×2 IMPLANT
DERMABOND ADVANCED (GAUZE/BANDAGES/DRESSINGS) ×1
DERMABOND ADVANCED .7 DNX12 (GAUZE/BANDAGES/DRESSINGS) ×1 IMPLANT
DRAPE ARM DVNC X/XI (DISPOSABLE) ×4 IMPLANT
DRAPE COLUMN DVNC XI (DISPOSABLE) ×1 IMPLANT
DRAPE DA VINCI XI ARM (DISPOSABLE) ×4
DRAPE DA VINCI XI COLUMN (DISPOSABLE) ×1
DRAPE SHEET LG 3/4 BI-LAMINATE (DRAPES) ×2 IMPLANT
ELECT CAUTERY BLADE 6.4 (BLADE) ×2 IMPLANT
ELECT REM PT RETURN 9FT ADLT (ELECTROSURGICAL) ×2
ELECTRODE REM PT RTRN 9FT ADLT (ELECTROSURGICAL) ×1 IMPLANT
GLOVE BIO SURGEON STRL SZ7 (GLOVE) ×4 IMPLANT
GOWN STRL REUS W/ TWL LRG LVL3 (GOWN DISPOSABLE) ×4 IMPLANT
GOWN STRL REUS W/TWL LRG LVL3 (GOWN DISPOSABLE) ×4
IRRIGATION STRYKERFLOW (MISCELLANEOUS) IMPLANT
IRRIGATOR STRYKERFLOW (MISCELLANEOUS)
IV NS 1000ML (IV SOLUTION)
IV NS 1000ML BAXH (IV SOLUTION) IMPLANT
KIT PINK PAD W/HEAD ARE REST (MISCELLANEOUS) ×2
KIT PINK PAD W/HEAD ARM REST (MISCELLANEOUS) ×1 IMPLANT
LABEL OR SOLS (LABEL) ×2 IMPLANT
NEEDLE HYPO 22GX1.5 SAFETY (NEEDLE) ×2 IMPLANT
NS IRRIG 500ML POUR BTL (IV SOLUTION) ×2 IMPLANT
OBTURATOR OPTICAL STANDARD 8MM (TROCAR) ×1
OBTURATOR OPTICAL STND 8 DVNC (TROCAR) ×1
OBTURATOR OPTICALSTD 8 DVNC (TROCAR) ×1 IMPLANT
PACK LAP CHOLECYSTECTOMY (MISCELLANEOUS) ×2 IMPLANT
PENCIL ELECTRO HAND CTR (MISCELLANEOUS) ×2 IMPLANT
POUCH SPECIMEN RETRIEVAL 10MM (ENDOMECHANICALS) ×2 IMPLANT
SEAL CANN UNIV 5-8 DVNC XI (MISCELLANEOUS) ×4 IMPLANT
SEAL XI 5MM-8MM UNIVERSAL (MISCELLANEOUS) ×4
SOLUTION ELECTROLUBE (MISCELLANEOUS) ×2 IMPLANT
SPONGE LAP 18X18 RF (DISPOSABLE) ×2 IMPLANT
SUT MNCRL AB 4-0 PS2 18 (SUTURE) ×2 IMPLANT
SUT VICRYL 0 AB UR-6 (SUTURE) ×4 IMPLANT
TROCAR 130MM GELPORT  DAV (MISCELLANEOUS) ×2 IMPLANT
TUBING EVAC SMOKE HEATED PNEUM (TUBING) ×2 IMPLANT

## 2019-01-10 NOTE — Anesthesia Procedure Notes (Signed)
Procedure Name: Intubation Performed by: Philbert Riser, CRNA Pre-anesthesia Checklist: Patient identified, Emergency Drugs available, Suction available and Patient being monitored Patient Re-evaluated:Patient Re-evaluated prior to induction Oxygen Delivery Method: Circle system utilized Preoxygenation: Pre-oxygenation with 100% oxygen Induction Type: IV induction Ventilation: Mask ventilation without difficulty Laryngoscope Size: McGraph and 3 Grade View: Grade I Tube type: Oral Tube size: 7.0 mm Number of attempts: 1 Airway Equipment and Method: Stylet,  Oral airway and Video-laryngoscopy Placement Confirmation: ETT inserted through vocal cords under direct vision,  positive ETCO2 and breath sounds checked- equal and bilateral Secured at: 21 cm Tube secured with: Tape Dental Injury: Teeth and Oropharynx as per pre-operative assessment  Difficulty Due To: Difficult Airway- due to limited oral opening and Difficulty was anticipated

## 2019-01-10 NOTE — Interval H&P Note (Signed)
History and Physical Interval Note:  01/10/2019 9:02 AM  Sierra Moore  has presented today for surgery, with the diagnosis of BILIARY COLIC.  The various methods of treatment have been discussed with the patient and family. After consideration of risks, benefits and other options for treatment, the patient has consented to  Procedure(s): ROBOTIC ASSISTED LAPAROSCOPIC CHOLECYSTECTOMY (N/A) as a surgical intervention.  The patient's history has been reviewed, patient examined, no change in status, stable for surgery.  I have reviewed the patient's chart and labs.  Questions were answered to the patient's satisfaction.     Ashley

## 2019-01-10 NOTE — Transfer of Care (Signed)
Immediate Anesthesia Transfer of Care Note  Patient: Sierra Moore  Procedure(s) Performed: ROBOTIC ASSISTED LAPAROSCOPIC CHOLECYSTECTOMY (N/A )  Patient Location: PACU  Anesthesia Type:General  Level of Consciousness: awake, alert  and oriented  Airway & Oxygen Therapy: Patient Spontanous Breathing and Patient connected to face mask oxygen  Post-op Assessment: Report given to RN and Post -op Vital signs reviewed and stable  Post vital signs: Reviewed and stable  Last Vitals:  Vitals Value Taken Time  BP    Temp    Pulse 87 01/10/19 1040  Resp    SpO2 100 % 01/10/19 1040  Vitals shown include unvalidated device data.  Last Pain:  Vitals:   01/10/19 0843  TempSrc: Temporal  PainSc: 0-No pain         Complications: No apparent anesthesia complications

## 2019-01-10 NOTE — Anesthesia Preprocedure Evaluation (Signed)
Anesthesia Evaluation  Patient identified by MRN, date of birth, ID band Patient awake    Reviewed: Allergy & Precautions, H&P , NPO status , Patient's Chart, lab work & pertinent test results  History of Anesthesia Complications Negative for: history of anesthetic complications  Airway Mallampati: II  TM Distance: >3 FB Neck ROM: full    Dental  (+) Chipped   Pulmonary neg pulmonary ROS, neg shortness of breath,           Cardiovascular Exercise Tolerance: Good (-) angina(-) Past MI and (-) DOE negative cardio ROS       Neuro/Psych PSYCHIATRIC DISORDERS negative neurological ROS     GI/Hepatic Neg liver ROS, GERD  Medicated and Controlled,  Endo/Other  diabetes, Type 2  Renal/GU      Musculoskeletal  (+) Arthritis ,   Abdominal   Peds  Hematology negative hematology ROS (+)   Anesthesia Other Findings Past Medical History: No date: Allergy No date: Arthritis     Comment:  POLYARTHRALGIA No date: GERD (gastroesophageal reflux disease) No date: Leg swelling No date: Obesity No date: PMDD (premenstrual dysphoric disorder) No date: PMDD (premenstrual dysphoric disorder) No date: Raynaud disease No date: Vitamin D deficiency  Past Surgical History: No date: TONSILLECTOMY  BMI    Body Mass Index: 40.70 kg/m      Reproductive/Obstetrics negative OB ROS                             Anesthesia Physical Anesthesia Plan  ASA: III  Anesthesia Plan: General ETT   Post-op Pain Management:    Induction: Intravenous  PONV Risk Score and Plan: Ondansetron, Dexamethasone, Midazolam and Treatment may vary due to age or medical condition  Airway Management Planned: Oral ETT  Additional Equipment:   Intra-op Plan:   Post-operative Plan: Extubation in OR  Informed Consent: I have reviewed the patients History and Physical, chart, labs and discussed the procedure including the  risks, benefits and alternatives for the proposed anesthesia with the patient or authorized representative who has indicated his/her understanding and acceptance.     Dental Advisory Given  Plan Discussed with: Anesthesiologist, CRNA and Surgeon  Anesthesia Plan Comments: (Patient consented for risks of anesthesia including but not limited to:  - adverse reactions to medications - damage to teeth, lips or other oral mucosa - sore throat or hoarseness - Damage to heart, brain, lungs or loss of life  Patient voiced understanding.)        Anesthesia Quick Evaluation

## 2019-01-10 NOTE — Op Note (Signed)
Robotic assisted laparoscopic Cholecystectomy  Pre-operative Diagnosis: Biliary colic  Post-operative Diagnosis: same  Procedure:  Robotic assisted laparoscopic Cholecystectomy  Surgeon: Caroleen Hamman, MD FACS  Anesthesia: Gen. with endotracheal tube  Findings: Chronic mild Cholecystitis   Estimated Blood Loss: 5cc       Specimens: Gallbladder           Complications: none   Procedure Details  The patient was seen again in the Holding Room. The benefits, complications, treatment options, and expected outcomes were discussed with the patient. The risks of bleeding, infection, recurrence of symptoms, failure to resolve symptoms, bile duct damage, bile duct leak, retained common bile duct stone, bowel injury, any of which could require further surgery and/or ERCP, stent, or papillotomy were reviewed with the patient. The likelihood of improving the patient's symptoms with return to their baseline status is good.  The patient and/or family concurred with the proposed plan, giving informed consent.  The patient was taken to Operating Room, identified  and the procedure verified as Laparoscopic Cholecystectomy.  A Time Out was held and the above information confirmed.  Prior to the induction of general anesthesia, antibiotic prophylaxis was administered. VTE prophylaxis was in place. General endotracheal anesthesia was then administered and tolerated well. After the induction, the abdomen was prepped with Chloraprep and draped in the sterile fashion. The patient was positioned in the supine position.  Cut down technique was used to enter the abdominal cavity and a Hasson trochar was placed after two vicryl stitches were anchored to the fascia. Pneumoperitoneum was then created with CO2 and tolerated well without any adverse changes in the patient's vital signs.  Three 8-mm ports were placed under direct vision. All skin incisions  were infiltrated with a local anesthetic agent before making the  incision and placing the trocars.   The patient was positioned  in reverse Trendelenburg, robot was brought to the surgical field and docked in the standard fashion.  We made sure all the instrumentation was kept indirect view at all times and that there were no collision between the arms. I scrubbed out and went to the console.  The gallbladder was identified, the fundus grasped and retracted cephalad. Adhesions were lysed bluntly. The infundibulum was grasped and retracted laterally, exposing the peritoneum overlying the triangle of Calot. This was then divided and exposed in a blunt fashion. An extended critical view of the cystic duct and cystic artery was obtained.  The cystic duct was clearly identified and bluntly dissected.   Artery and duct were double clipped and divided.  The gallbladder was taken from the gallbladder fossa in a retrograde fashion with the electrocautery.  Hemostasis was achieved with the electrocautery. nspection of the right upper quadrant was performed. No bleeding, bile duct injury or leak, or bowel injury was noted. Robotic instruments and robotic arms were undocked in the standard fashion.  I scrubbed back in.  The gallbladder was removed and placed in an Endocatch bag.   Pneumoperitoneum was released.  The periumbilical port site was closed with interrumpted 0 Vicryl sutures. 4-0 subcuticular Monocryl was used to close the skin. Dermabond was  applied.  The patient was then extubated and brought to the recovery room in stable condition. Sponge, lap, and needle counts were correct at closure and at the conclusion of the case.               Caroleen Hamman, MD, FACS

## 2019-01-10 NOTE — Anesthesia Postprocedure Evaluation (Signed)
Anesthesia Post Note  Patient: Sierra Moore  Procedure(s) Performed: ROBOTIC ASSISTED LAPAROSCOPIC CHOLECYSTECTOMY (N/A )  Patient location during evaluation: PACU Anesthesia Type: General Level of consciousness: awake and alert Pain management: pain level controlled Vital Signs Assessment: post-procedure vital signs reviewed and stable Respiratory status: spontaneous breathing and respiratory function stable Cardiovascular status: stable Anesthetic complications: no     Last Vitals:  Vitals:   01/10/19 1042 01/10/19 1052  BP: (!) 149/91   Pulse: 86 83  Resp: (!) 26 12  Temp: 36.8 C   SpO2: 100% 100%    Last Pain:  Vitals:   01/10/19 1052  TempSrc:   PainSc: 7                  KEPHART,WILLIAM K

## 2019-01-10 NOTE — Discharge Instructions (Addendum)
Laparoscopic Cholecystectomy, Care After  ° °These instructions give you information on caring for yourself after your procedure. Your doctor may also give you more specific instructions. Call your doctor if you have any problems or questions after your procedure.  °HOME CARE  °Change your bandages (dressings) as told by your doctor.  °Keep the wound dry and clean. Wash the wound gently with soap and water. Pat the wound dry with a clean towel.  °Do not take baths, swim, or use hot tubs for 2 weeks, or as told by your doctor.  °Only take medicine as told by your doctor.  °Eat a normal diet as told by your doctor.  °Do not lift anything heavier than 10 pounds (4.5 kg) until your doctor says it is okay.  °Do not play contact sports for 1 week, or as told by your doctor. °GET HELP IF:  °Your wound is red, puffy (swollen), or painful.  °You have yellowish-white fluid (pus) coming from the wound.  °You have fluid draining from the wound for more than 1 day.  °You have a bad smell coming from the wound.  °Your wound breaks open. °GET HELP RIGHT AWAY IF:  °You have trouble breathing.  °You have chest pain.  °You have a fever >101  °You have pain in the shoulders (shoulder strap areas) that is getting worse.  °You feel dizzy or pass out (faint).  °You have severe belly (abdominal) pain.  °You feel sick to your stomach (nauseous) or throw up (vomit) for more than 1 day. ° ° ° °AMBULATORY SURGERY  °DISCHARGE INSTRUCTIONS ° ° °1) The drugs that you were given will stay in your system until tomorrow so for the next 24 hours you should not: ° °A) Drive an automobile °B) Make any legal decisions °C) Drink any alcoholic beverage ° ° °2) You may resume regular meals tomorrow.  Today it is better to start with liquids and gradually work up to solid foods. ° °You may eat anything you prefer, but it is better to start with liquids, then soup and crackers, and gradually work up to solid foods. ° ° °3) Please notify your doctor  immediately if you have any unusual bleeding, trouble breathing, redness and pain at the surgery site, drainage, fever, or pain not relieved by medication. ° ° ° °4) Additional Instructions: ° ° ° ° ° ° ° °Please contact your physician with any problems or Same Day Surgery at [phone], Monday through Friday 6 am to 4 pm, or [facility] Main number at [phone]. ° ° °

## 2019-01-10 NOTE — Anesthesia Post-op Follow-up Note (Signed)
Anesthesia QCDR form completed.        

## 2019-01-11 ENCOUNTER — Encounter: Payer: Self-pay | Admitting: Surgery

## 2019-01-11 LAB — SURGICAL PATHOLOGY

## 2019-01-18 ENCOUNTER — Ambulatory Visit (INDEPENDENT_AMBULATORY_CARE_PROVIDER_SITE_OTHER): Payer: Federal, State, Local not specified - PPO

## 2019-01-18 ENCOUNTER — Ambulatory Visit: Payer: Federal, State, Local not specified - PPO | Admitting: Podiatry

## 2019-01-18 ENCOUNTER — Encounter: Payer: Self-pay | Admitting: Podiatry

## 2019-01-18 ENCOUNTER — Other Ambulatory Visit: Payer: Self-pay

## 2019-01-18 VITALS — Temp 98.0°F

## 2019-01-18 DIAGNOSIS — M778 Other enthesopathies, not elsewhere classified: Secondary | ICD-10-CM

## 2019-01-18 DIAGNOSIS — M779 Enthesopathy, unspecified: Secondary | ICD-10-CM

## 2019-01-18 DIAGNOSIS — M76821 Posterior tibial tendinitis, right leg: Secondary | ICD-10-CM | POA: Diagnosis not present

## 2019-01-18 MED ORDER — METHYLPREDNISOLONE 4 MG PO TBPK: ORAL_TABLET | ORAL | 0 refills | Status: DC

## 2019-01-18 NOTE — Progress Notes (Signed)
Subjective:  Patient ID: Sierra Moore, female    DOB: December 09, 1970,  MRN: [id number] HPI Chief Complaint  Patient presents with  . Foot Pain    Patient presents today for right foot pain x 3 months.  She reports the medial side of her foot/ankle has been hurtine, more when walking alot.  She states "it has gotten better since Ive been exercising my foot"    48 y.o. female presents with the above complaint.   ROS: Denies fever chills nausea vomiting muscle aches pains calf pain back pain chest pain shortness of breath.  Past Medical History:  Diagnosis Date  . Allergy   . Arthritis    POLYARTHRALGIA  . GERD (gastroesophageal reflux disease)   . Leg swelling   . Obesity   . PMDD (premenstrual dysphoric disorder)   . PMDD (premenstrual dysphoric disorder)   . Raynaud disease   . Vitamin D deficiency    Past Surgical History:  Procedure Laterality Date  . ROBOTIC ASSISTED LAPAROSCOPIC CHOLECYSTECTOMY N/A 01/10/2019   Procedure: ROBOTIC ASSISTED LAPAROSCOPIC CHOLECYSTECTOMY;  Surgeon: Jules Husbands, MD;  Location: [facility] ORS;  Service: General;  Laterality: N/A;  . TONSILLECTOMY      Current Outpatient Medications:  .  LESSINA-28 0.1-20 MG-MCG tablet, TAKE 1 TABLET BY MOUTH DAILY CONTINUOUSLY (Patient taking differently: Take 1 tablet by mouth daily. ), Disp: 84 tablet, Rfl: 4 .  meloxicam (MOBIC) 15 MG tablet, Take 15 mg by mouth daily. , Disp: , Rfl:  .  methylPREDNISolone (MEDROL DOSEPAK) 4 MG TBPK tablet, 6 day dose pack - take as directed, Disp: 21 tablet, Rfl: 0  Allergies  Allergen Reactions  . Penicillins Hives    Did it involve swelling of the face/tongue/throat, SOB, or low BP? No Did it involve sudden or severe rash/hives, skin peeling, or any reaction on the inside of your mouth or nose? No Did you need to seek medical attention at a hospital or doctor's office? No When did it last happen?within the last 5 years If all above answers are "NO", may proceed  with cephalosporin use.    Review of Systems Objective:   Vitals:   01/18/19 1033  Temp: 54 F (36.7 C)    General: Well developed, nourished, in no acute distress, alert and oriented x3   Dermatological: Skin is warm, dry and supple bilateral. Nails x 10 are well maintained; remaining integument appears unremarkable at this time. There are no open sores, no preulcerative lesions, no rash or signs of infection present.  Vascular: Dorsalis Pedis artery and Posterior Tibial artery pedal pulses are 2/4 bilateral with immedate capillary fill time. Pedal hair growth present. No varicosities and no lower extremity edema present bilateral.   Neruologic: Grossly intact via light touch bilateral. Vibratory intact via tuning fork bilateral. Protective threshold with Semmes Wienstein monofilament intact to all pedal sites bilateral. Patellar and Achilles deep tendon reflexes 2+ bilateral. No Babinski or clonus noted bilateral.   Musculoskeletal: No gross boney pedal deformities bilateral. No pain, crepitus, or limitation noted with foot and ankle range of motion bilateral. Muscular strength 5/5 in all groups tested bilateral.  She has pain on palpation of the posterior tibial tendon inframalleolar Lee extending to the navicular tuberosity.  There is fluctuance there.  She has flexible flatfoot deformity bilateral right greater than left.  Gait: Unassisted, Nonantalgic.    Radiographs:  Radiographs taken today demonstrate pes planus with soft tissue increase in density along the medial aspect of the posterior tibial tendon  area.  No acute findings.  Assessment & Plan:   Assessment: Posterior tibial tendinitis with pes planus right.  Plan: Discussed appropriate shoe gear stretching exercises ice therapy shoe modifications we discussed etiology pathology conservative versus surgical therapies.  At this point I injected the tendon sheath with 10 mg of Kenalog 5 mg Marcaine point maximal tenderness  medially.  She tolerated procedure well.  Start her on a Medrol Dosepak to be followed by meloxicam.  Follow-up with her in 4 to 6 weeks     Shayda Kalka T. [place], [place]

## 2019-02-02 DIAGNOSIS — F411 Generalized anxiety disorder: Secondary | ICD-10-CM | POA: Diagnosis not present

## 2019-02-08 ENCOUNTER — Encounter: Payer: Self-pay | Admitting: Surgery

## 2019-02-08 ENCOUNTER — Other Ambulatory Visit: Payer: Self-pay

## 2019-02-08 ENCOUNTER — Ambulatory Visit (INDEPENDENT_AMBULATORY_CARE_PROVIDER_SITE_OTHER): Payer: Federal, State, Local not specified - PPO | Admitting: Surgery

## 2019-02-08 VITALS — BP 128/74 | HR 79 | Temp 97.7°F | Ht 62.0 in | Wt 201.0 lb

## 2019-02-08 DIAGNOSIS — Z09 Encounter for follow-up examination after completed treatment for conditions other than malignant neoplasm: Secondary | ICD-10-CM

## 2019-02-08 NOTE — Patient Instructions (Signed)
Return as needed.The patient is aware to call back for any questions or concerns.  

## 2019-02-08 NOTE — Progress Notes (Signed)
S/p rob chole Doing very well No complaints Taking Po No fevers or chills  PE NAD Abd: soft, incision c/d/i, no infection    A/P doing very well Path d/w pt F/u prn  Caroleen Hamman, MD [facility] General Surgeon

## 2019-03-01 ENCOUNTER — Encounter: Payer: Self-pay | Admitting: Podiatry

## 2019-03-01 ENCOUNTER — Other Ambulatory Visit: Payer: Self-pay

## 2019-03-01 ENCOUNTER — Ambulatory Visit (INDEPENDENT_AMBULATORY_CARE_PROVIDER_SITE_OTHER): Payer: Federal, State, Local not specified - PPO | Admitting: Podiatry

## 2019-03-01 DIAGNOSIS — M76821 Posterior tibial tendinitis, right leg: Secondary | ICD-10-CM | POA: Diagnosis not present

## 2019-03-01 NOTE — Progress Notes (Signed)
She presents today for follow-up of her posterior tibial tendinitis right she states that is approximately 90% improved just every once in a while get a little pain but most of the time of sitting when I get that pain.  Objective: Vital signs are stable she is alert and oriented x3 much decrease in edema no pain on palpation to the posterior tibial tendon right.  There is no erythema cellulitis drainage or odor.  Assessment: Resolving posterior tibial tendinitis with pes planus.  Plan: Offered her orthotics today but she declined.  I instructed her to continue all other conservative therapies follow-up with me as needed.

## 2019-03-09 ENCOUNTER — Emergency Department: Payer: Federal, State, Local not specified - PPO

## 2019-03-09 ENCOUNTER — Encounter: Payer: Self-pay | Admitting: Emergency Medicine

## 2019-03-09 ENCOUNTER — Other Ambulatory Visit: Payer: Self-pay

## 2019-03-09 ENCOUNTER — Emergency Department
Admission: EM | Admit: 2019-03-09 | Discharge: 2019-03-10 | Disposition: A | Discharge status: Home / Self Care | Payer: Federal, State, Local not specified - PPO | Attending: Emergency Medicine | Admitting: Emergency Medicine

## 2019-03-09 DIAGNOSIS — R079 Chest pain, unspecified: Secondary | ICD-10-CM | POA: Diagnosis not present

## 2019-03-09 DIAGNOSIS — K429 Umbilical hernia without obstruction or gangrene: Secondary | ICD-10-CM

## 2019-03-09 DIAGNOSIS — K5730 Diverticulosis of large intestine without perforation or abscess without bleeding: Secondary | ICD-10-CM | POA: Diagnosis not present

## 2019-03-09 DIAGNOSIS — N200 Calculus of kidney: Secondary | ICD-10-CM | POA: Insufficient documentation

## 2019-03-09 DIAGNOSIS — K5790 Diverticulosis of intestine, part unspecified, without perforation or abscess without bleeding: Secondary | ICD-10-CM | POA: Diagnosis not present

## 2019-03-09 DIAGNOSIS — R1013 Epigastric pain: Secondary | ICD-10-CM

## 2019-03-09 DIAGNOSIS — K5792 Diverticulitis of intestine, part unspecified, without perforation or abscess without bleeding: Secondary | ICD-10-CM | POA: Diagnosis not present

## 2019-03-09 LAB — URINALYSIS, COMPLETE (UACMP) WITH MICROSCOPIC
Bacteria, UA: NONE SEEN
Bilirubin Urine: NEGATIVE
Glucose, UA: NEGATIVE mg/dL
Ketones, ur: NEGATIVE mg/dL
Leukocytes,Ua: NEGATIVE
Nitrite: NEGATIVE
Protein, ur: NEGATIVE mg/dL
Specific Gravity, Urine: 1.025 (ref 1.005–1.030)
pH: 6 (ref 5.0–8.0)

## 2019-03-09 LAB — CBC
HCT: 46.5 % — ABNORMAL HIGH (ref 36.0–46.0)
Hemoglobin: 14.4 g/dL (ref 12.0–15.0)
MCH: 25.3 pg — ABNORMAL LOW (ref 26.0–34.0)
MCHC: 31 g/dL (ref 30.0–36.0)
MCV: 81.7 fL (ref 80.0–100.0)
Platelets: 418 10*3/uL — ABNORMAL HIGH (ref 150–400)
RBC: 5.69 MIL/uL — ABNORMAL HIGH (ref 3.87–5.11)
RDW: 14 % (ref 11.5–15.5)
WBC: 17.8 10*3/uL — ABNORMAL HIGH (ref 4.0–10.5)
nRBC: 0 % (ref 0.0–0.2)

## 2019-03-09 LAB — BASIC METABOLIC PANEL
Anion gap: 8 (ref 5–15)
BUN: 14 mg/dL (ref 6–20)
CO2: 25 mmol/L (ref 22–32)
Calcium: 8.6 mg/dL — ABNORMAL LOW (ref 8.9–10.3)
Chloride: 106 mmol/L (ref 98–111)
Creatinine, Ser: 0.83 mg/dL (ref 0.44–1.00)
GFR calc Af Amer: >60 mL/min (ref >60)
GFR calc non Af Amer: >60 mL/min (ref >60)
Glucose, Bld: 170 mg/dL — ABNORMAL HIGH (ref 70–99)
Potassium: 4.2 mmol/L (ref 3.5–5.1)
Sodium: 139 mmol/L (ref 135–145)

## 2019-03-09 LAB — LIPASE, BLOOD: Lipase: 29 U/L (ref 11–51)

## 2019-03-09 LAB — POCT PREGNANCY, URINE: Preg Test, Ur: NEGATIVE

## 2019-03-09 MED ORDER — SODIUM CHLORIDE 0.9% FLUSH: 3.0000 mL | Freq: Once | INTRAVENOUS | Status: DC

## 2019-03-09 MED ORDER — IOHEXOL 300 MG/ML  SOLN
75.0000 mL | Freq: Once | INTRAMUSCULAR | Status: AC | PRN
  Administered 2019-03-09: 100 mL via INTRAVENOUS

## 2019-03-09 NOTE — ED Provider Notes (Signed)
[facility] Emergency Department Provider Note    First MD Initiated Contact with Patient 03/09/19 2306     (approximate)  I have reviewed the triage vital signs and the nursing notes.   HISTORY  Chief Complaint Chest Pain    HPI Sierra Moore is a 48 y.o. female with below list of previous medical conditions presents to the emergency department secondary to 2 episodes of epigastric abdominal pain today.  Patient states that the first episode occurred at 1:30 PM this afternoon.  Patient states last p.o. intake was at 10:00 AM before that episode.  Patient states that a subsequent episode occurred at 4:30 PM with pain that was severe.  Patient states current pain score is 1 out of 10.  Patient denies any nausea vomiting diarrhea.  Patient does admit to constipation.  Patient denies any urinary symptoms.  Patient denies any fever.        Past Medical History:  Diagnosis Date  . Allergy   . Arthritis    POLYARTHRALGIA  . GERD (gastroesophageal reflux disease)   . Leg swelling   . Obesity   . PMDD (premenstrual dysphoric disorder)   . PMDD (premenstrual dysphoric disorder)   . Raynaud disease   . Vitamin D deficiency     Patient Active Problem List   Diagnosis Date Noted  . Onychomycosis of toenail 12/27/2018  . Polyarthralgia 12/27/2018  . HLD (hyperlipidemia) 12/21/2018  . Right foot pain 12/07/2018  . Eczema 03/04/2017  . Raynaud's phenomenon without gangrene 03/04/2017  . Dysuria 09/25/2016  . Mixed stress and urge urinary incontinence 09/25/2016  . Mild obesity 03/04/2016  . Seasonal allergies 02/10/2016  . Encounter for routine adult physical exam with abnormal findings 02/10/2016  . Heat intolerance 02/10/2016  . Hematuria 02/10/2016  . Vitamin D deficiency 10/25/2015  . DM (diabetes mellitus) ([place]) 10/25/2015  . IBS (irritable bowel syndrome) 10/24/2015    Past Surgical History:  Procedure Laterality Date  . ROBOTIC ASSISTED  LAPAROSCOPIC CHOLECYSTECTOMY N/A 01/10/2019   Procedure: ROBOTIC ASSISTED LAPAROSCOPIC CHOLECYSTECTOMY;  Surgeon: Jules Husbands, MD;  Location: [facility] ORS;  Service: General;  Laterality: N/A;  . TONSILLECTOMY      Prior to Admission medications   Medication Sig Start Date End Date Taking? Authorizing Provider  LESSINA-28 0.1-20 MG-MCG tablet TAKE 1 TABLET BY MOUTH DAILY CONTINUOUSLY Patient taking differently: Take 1 tablet by mouth daily.  12/09/18   Shambley, Melody N, CNM  meloxicam (MOBIC) 15 MG tablet Take 15 mg by mouth daily.  08/31/18   [provider]    Allergies Penicillins  Family History  Problem Relation Age of Onset  . Arthritis Mother   . Raynaud syndrome Mother   . Arthritis Father   . Breast cancer Neg Hx   . Bladder Cancer Neg Hx   . Kidney cancer Neg Hx     Social History Social History   Tobacco Use  . Smoking status: Never Smoker  . Smokeless tobacco: Never Used  Substance Use Topics  . Alcohol use: No  . Drug use: No    Review of Systems Constitutional: No fever/chills Eyes: No visual changes. ENT: No sore throat. Cardiovascular: Denies chest pain. Respiratory: Denies shortness of breath. Gastrointestinal: No abdominal pain.  No nausea, no vomiting.  No diarrhea.  No constipation. Genitourinary: Negative for dysuria. Musculoskeletal: Negative for neck pain.  Negative for back pain. Integumentary: Negative for rash. Neurological: Negative for headaches, focal weakness or numbness.  ____________________________________________  PHYSICAL EXAM:  VITAL SIGNS: ED Triage Vitals [03/09/19 1723]  Enc Vitals Group     BP (!) 161/94     Pulse Rate (!) 110     Resp 18     Temp 97.7 F (36.5 C)     Temp Source Oral     SpO2 100 %     Weight 88.5 kg (195 lb)     Height 1.549 m (5\' 1" )     Head Circumference      Peak Flow      Pain Score 2     Pain Loc      Pain Edu?      Excl. in [place]?     Constitutional: Alert and oriented.   Eyes: Conjunctivae are normal.  Mouth/Throat: Mucous membranes are moist. Neck: No stridor.  No meningeal signs.   Cardiovascular: Normal rate, regular rhythm. Good peripheral circulation. Grossly normal heart sounds. Respiratory: Normal respiratory effort.  No retractions. Gastrointestinal: Soft and nontender. No distention.  Musculoskeletal: No lower extremity tenderness nor edema. No gross deformities of extremities. Neurologic:  Normal speech and language. No gross focal neurologic deficits are appreciated.  Skin:  Skin is warm, dry and intact. Psychiatric: Mood and affect are normal. Speech and behavior are normal.  ____________________________________________   LABS (all labs ordered are listed, but only abnormal results are displayed)  Labs Reviewed  BASIC METABOLIC PANEL - Abnormal; Notable for the following components:      Result Value   Glucose, Bld 170 (*)    Calcium 8.6 (*)    All other components within normal limits  CBC - Abnormal; Notable for the following components:   WBC 17.8 (*)    RBC 5.69 (*)    HCT 46.5 (*)    MCH 25.3 (*)    Platelets 418 (*)    All other components within normal limits  URINALYSIS, COMPLETE (UACMP) WITH MICROSCOPIC - Abnormal; Notable for the following components:   Color, Urine YELLOW (*)    APPearance CLEAR (*)    Hgb urine dipstick MODERATE (*)    All other components within normal limits  LIPASE, BLOOD  POC URINE PREG, ED  POCT PREGNANCY, URINE   ____________________________________________  EKG  ED ECG REPORT I, [place] N BROWN, the attending physician, personally viewed and interpreted this ECG.   Date: 03/09/2019  EKG Time: 5:25 PM  Rate: 97  Rhythm: Normal sinus rhythm  Axis: Normal  Intervals: Normal  ST&T Change: None  ____________________________________________  RADIOLOGY I, [place] N BROWN, personally viewed and evaluated these images (plain radiographs) as part of my medical decision making, as well as  reviewing the written report by the radiologist.  ED MD interpretation: No acute cardiopulmonary disease noted on chest x-ray per radiologist.  Official radiology report(s): Dg Chest 2 View  Result Date: 03/09/2019 CLINICAL DATA:  48 year old presenting with acute onset of chest pain radiating into the UPPER extremities and into her neck and head. EXAM: CHEST - 2 VIEW COMPARISON:  None. FINDINGS: Cardiomediastinal silhouette unremarkable. Lungs clear. Bronchovascular markings normal. Pulmonary vascularity normal. No visible pleural effusions. No pneumothorax. Slight UPPER thoracic levoscoliosis. IMPRESSION: No acute cardiopulmonary disease. Electronically Signed   By: Evangeline Dakin M.D.   On: 03/09/2019 20:01      Procedures   ____________________________________________   INITIAL IMPRESSION / MDM / ASSESSMENT AND PLAN / ED COURSE  As part of my medical decision making, I reviewed the following data within the electronic MEDICAL RECORD NUMBER  48 year old  female presented with above-stated history and physical exam secondary to epigastric abdominal discomfort presenting possibility of ulcer disease pancreatitis diverticulitis choledocholithiasis however suspect this to be unlikely.  Laboratory data revealed leukocytosis of 17.8.  CT scan of the abdomen pelvis revealed no explanation for the patient's abdominal pain incidentally noted kidney stones and diverticulosis.  Patient be referred to gastroenterology for further outpatient evaluation.  ____________________________________________  FINAL CLINICAL IMPRESSION(S) / ED DIAGNOSES  Final diagnoses:  Epigastric pain  Diverticulosis of colon  Kidney stone  Umbilical hernia without obstruction and without gangrene     MEDICATIONS GIVEN DURING THIS VISIT:  Medications  iohexol (OMNIPAQUE) 300 MG/ML solution 75 mL (100 mLs Intravenous Contrast Given 03/09/19 2333)     ED Discharge Orders    None      *Please note:  Sierra Moore was evaluated in Emergency Department on 03/09/2019 for the symptoms described in the history of present illness. She was evaluated in the context of the global COVID-19 pandemic, which necessitated consideration that the patient might be at risk for infection with the SARS-CoV-2 virus that causes COVID-19. Institutional protocols and algorithms that pertain to the evaluation of patients at risk for COVID-19 are in a state of rapid change based on information released by regulatory bodies including the CDC and federal and state organizations. These policies and algorithms were followed during the patient's care in the ED.  Some ED evaluations and interventions may be delayed as a result of limited staffing during the pandemic.*  Note:  This document was prepared using Dragon voice recognition software and may include unintentional dictation errors.   Gregor Hams, MD 03/10/19 [phone]

## 2019-03-09 NOTE — ED Triage Notes (Signed)
PT c/o CP radiating up to her head and upper extreme ites. PT states she had same like symptoms with gallbladder but had it removed. Pt VSS, appears anxious.

## 2019-03-10 LAB — HEPATIC FUNCTION PANEL
ALT: 35 U/L (ref 0–44)
AST: 33 U/L (ref 15–41)
Albumin: 3.8 g/dL (ref 3.5–5.0)
Alkaline Phosphatase: 139 U/L — ABNORMAL HIGH (ref 38–126)
Bilirubin, Direct: <0.1 mg/dL (ref 0.0–0.2)
Total Bilirubin: 0.2 mg/dL — ABNORMAL LOW (ref 0.3–1.2)
Total Protein: 7.8 g/dL (ref 6.5–8.1)

## 2019-03-10 LAB — TROPONIN I (HIGH SENSITIVITY): Troponin I (High Sensitivity): 5 ng/L (ref <18)

## 2019-03-10 NOTE — ED Notes (Signed)
ED Provider at bedside. 

## 2019-03-16 ENCOUNTER — Other Ambulatory Visit: Payer: Self-pay

## 2019-03-16 ENCOUNTER — Encounter: Payer: Federal, State, Local not specified - PPO | Admitting: Obstetrics and Gynecology

## 2019-03-17 DIAGNOSIS — F418 Other specified anxiety disorders: Secondary | ICD-10-CM | POA: Diagnosis not present

## 2019-03-24 ENCOUNTER — Other Ambulatory Visit: Payer: Self-pay

## 2019-03-24 ENCOUNTER — Ambulatory Visit (INDEPENDENT_AMBULATORY_CARE_PROVIDER_SITE_OTHER): Payer: Federal, State, Local not specified - PPO | Admitting: Obstetrics and Gynecology

## 2019-03-24 ENCOUNTER — Other Ambulatory Visit (HOSPITAL_COMMUNITY)
Admission: RE | Admit: 2019-03-24 | Discharge: 2019-03-24 | Disposition: A | Discharge status: Home / Self Care | Payer: Federal, State, Local not specified - PPO | Source: Ambulatory Visit | Attending: Obstetrics and Gynecology | Admitting: Obstetrics and Gynecology

## 2019-03-24 ENCOUNTER — Encounter: Payer: Self-pay | Admitting: Obstetrics and Gynecology

## 2019-03-24 VITALS — BP 123/83 | HR 98 | Ht 61.0 in | Wt 200.7 lb

## 2019-03-24 DIAGNOSIS — Z6837 Body mass index (BMI) 37.0-37.9, adult: Secondary | ICD-10-CM | POA: Diagnosis not present

## 2019-03-24 DIAGNOSIS — Z01419 Encounter for gynecological examination (general) (routine) without abnormal findings: Secondary | ICD-10-CM | POA: Insufficient documentation

## 2019-03-24 MED ORDER — LESSINA 0.1-20 MG-MCG PO TABS: 1.0000 | ORAL_TABLET | Freq: Every day | ORAL | 2 refills | Status: DC

## 2019-03-24 NOTE — Progress Notes (Signed)
Subjective:   Sierra Moore is a 48 y.o. G68P0 Caucasian female here for a routine well-woman exam.  No LMP recorded. (Menstrual status: Oral contraceptives).    Current complaints: skipped a few pills a few times in last 3 months, and had 1 day of spotting with cramping. Questioning when needs to stop them for menopause. Concerned due to extreme fatigue around menses before on the pills.  PCP: Arnette       doesn't desire labs  Social History: Sexual: heterosexual Marital Status: married Living situation: with family Occupation: Medical laboratory scientific officer, and Oceanographer Tobacco/alcohol: no tobacco use Illicit drugs: no history of illicit drug use  The following portions of the patient's history were reviewed and updated as appropriate: allergies, current medications, past family history, past medical history, past social history, past surgical history and problem list.  Past Medical History Past Medical History:  Diagnosis Date  . Allergy   . Arthritis    POLYARTHRALGIA  . GERD (gastroesophageal reflux disease)   . Leg swelling   . Obesity   . PMDD (premenstrual dysphoric disorder)   . PMDD (premenstrual dysphoric disorder)   . Raynaud disease   . Vitamin D deficiency     Past Surgical History Past Surgical History:  Procedure Laterality Date  . ROBOTIC ASSISTED LAPAROSCOPIC CHOLECYSTECTOMY N/A 01/10/2019   Procedure: ROBOTIC ASSISTED LAPAROSCOPIC CHOLECYSTECTOMY;  Surgeon: Jules Husbands, MD;  Location: [facility] ORS;  Service: General;  Laterality: N/A;  . TONSILLECTOMY      Gynecologic History G2P0  No LMP recorded. (Menstrual status: Oral contraceptives). Contraception: OCP (estrogen/progesterone) Last Pap: 2017. Results were: normal Last mammogram: 02/2018. Results were: normal   Obstetric History OB History  Gravida Para Term Preterm AB Living  2         2  SAB TAB Ectopic Multiple Live Births          2    # Outcome Date GA Lbr Len/2nd Weight Sex Delivery Anes PTL  Lv  2 Gravida 2000    F Vag-Spont   LIV  1 Gravida 1997    M Vag-Spont   LIV    Current Medications Current Outpatient Medications on File Prior to Visit  Medication Sig Dispense Refill  . LESSINA-28 0.1-20 MG-MCG tablet TAKE 1 TABLET BY MOUTH DAILY CONTINUOUSLY (Patient taking differently: Take 1 tablet by mouth daily. ) 84 tablet 4  . meloxicam (MOBIC) 15 MG tablet Take 15 mg by mouth daily.      No current facility-administered medications on file prior to visit.     Review of Systems Patient denies any headaches, blurred vision, shortness of breath, chest pain, abdominal pain, problems with bowel movements, urination, or intercourse.  Objective:  BP 123/83   Pulse 98   Ht 5\' 1"  (1.549 m)   Wt 200 lb 11.2 oz (91 kg)   BMI 37.92 kg/m  Physical Exam  General:  Well developed, well nourished, no acute distress. She is alert and oriented x3. Skin:  Warm and dry Neck:  Midline trachea, no thyromegaly or nodules Cardiovascular: Regular rate and rhythm, no murmur heard Lungs:  Effort normal, all lung fields clear to auscultation bilaterally Breasts:  No dominant palpable mass, retraction, or nipple discharge Abdomen:  Soft, non tender, no hepatosplenomegaly or masses Pelvic:  External genitalia is normal in appearance.  The vagina is normal in appearance. The cervix is bulbous, no CMT.  Thin prep pap is not done . Uterus is felt to be normal size, shape,  and contour.  No adnexal masses or tenderness noted. Extremities:  No swelling or varicosities noted Psych:  She has a normal mood and affect  Assessment:   Healthy well-woman exam BMI 37  Plan:  Declined flu vaccine Discussed cessation of OCPs by April to see if menopasusal. Will consider. F/U 1 year for AE, or sooner if needed Mammogram ordered   Thoma Paulsen Rockney Ghee, CNM

## 2019-03-30 LAB — CYTOLOGY - PAP
Adequacy: ABSENT
Diagnosis: NEGATIVE
High risk HPV: NEGATIVE
Molecular Disclaimer: 56
Molecular Disclaimer: DETECTED
Molecular Disclaimer: NORMAL

## 2019-04-19 ENCOUNTER — Other Ambulatory Visit: Payer: Self-pay

## 2019-04-19 ENCOUNTER — Ambulatory Visit (INDEPENDENT_AMBULATORY_CARE_PROVIDER_SITE_OTHER): Payer: Federal, State, Local not specified - PPO | Admitting: Gastroenterology

## 2019-04-19 ENCOUNTER — Encounter: Payer: Self-pay | Admitting: Gastroenterology

## 2019-04-19 VITALS — BP 134/82 | HR 96 | Temp 97.5°F | Ht 61.0 in | Wt 202.4 lb

## 2019-04-19 DIAGNOSIS — R0789 Other chest pain: Secondary | ICD-10-CM | POA: Diagnosis not present

## 2019-04-19 DIAGNOSIS — K219 Gastro-esophageal reflux disease without esophagitis: Secondary | ICD-10-CM | POA: Diagnosis not present

## 2019-04-19 MED ORDER — SUPREP BOWEL PREP KIT 17.5-3.13-1.6 GM/177ML PO SOLN: 1.0000 | ORAL | 0 refills | Status: DC

## 2019-04-19 NOTE — Progress Notes (Signed)
Gastroenterology Consultation  Referring Provider:     Burnard Hawthorne, FNP Primary Care Physician:  Burnard Hawthorne, FNP Primary Gastroenterologist:  Dr. Allen Norris     Reason for Consultation:     Epigastric pain        HPI:   Sierra Moore is a 48 y.o. y/o female referred for consultation & management of epigastric pain by Dr. Vidal Schwalbe, Yvetta Coder, FNP.  This patient comes in today after being seen in the emergency department back on September 3 for what was reported to be chest pain but the discharging diagnosis was abdominal pain in the epigastric area.  The patient had a CT scan of the abdomen that showed:  IMPRESSION: 1. No acute abnormality or explanation for abdominal pain. 2. Nonobstructing bilateral nephrolithiasis. 3. Colonic diverticulosis without diverticulitis. Tiny fat containing umbilical hernia.  The patient was noted to have some hemoglobin in her urine and a elevated white cell count at 17.8.  She had her gallbladder taken out on July 7 of this year.  The patient's liver function tests showed an isolated increased alkaline phosphatase at 139.  She was discharged from the emergency room with recommendations to follow-up with GI. The patient reports that she has had 2 similar episodes of this pain which she reports to be a pressure-like pain in the mid chest and epigastric area.  She reports that the first time she had it she was found to have gallstones and had her gallbladder taken out at that time and the second time it subsided on its own.  The patient reports that she was told that she may have an ulcer although when the pain subsided she did not have a residual pain and was back to her baseline.   Past Medical History:  Diagnosis Date   Allergy    Arthritis    POLYARTHRALGIA   GERD (gastroesophageal reflux disease)    Leg swelling    Obesity    PMDD (premenstrual dysphoric disorder)    PMDD (premenstrual dysphoric disorder)    Raynaud disease     Vitamin D deficiency     Past Surgical History:  Procedure Laterality Date   ROBOTIC ASSISTED LAPAROSCOPIC CHOLECYSTECTOMY N/A 01/10/2019   Procedure: ROBOTIC ASSISTED LAPAROSCOPIC CHOLECYSTECTOMY;  Surgeon: Jules Husbands, MD;  Location: [facility] ORS;  Service: General;  Laterality: N/A;   TONSILLECTOMY      Prior to Admission medications   Medication Sig Start Date End Date Taking? Authorizing Provider  LESSINA-28 0.1-20 MG-MCG tablet Take 1 tablet by mouth daily. 03/24/19   Shambley, Melody N, CNM  meloxicam (MOBIC) 15 MG tablet Take 15 mg by mouth daily.  08/31/18   [provider]    Family History  Problem Relation Age of Onset   Arthritis Mother    Raynaud syndrome Mother    Arthritis Father    Breast cancer Neg Hx    Bladder Cancer Neg Hx    Kidney cancer Neg Hx      Social History   Tobacco Use   Smoking status: Never Smoker   Smokeless tobacco: Never Used  Substance Use Topics   Alcohol use: No   Drug use: No    Allergies as of 04/19/2019 - Review Complete 03/24/2019  Allergen Reaction Noted   Penicillins Hives 10/21/2015    Review of Systems:    All systems reviewed and negative except where noted in HPI.   Physical Exam:  There were no vitals taken for this visit.  No LMP recorded. (Menstrual status: Oral contraceptives). General:   Alert,  Well-developed, well-nourished, pleasant and cooperative in NAD Head:  Normocephalic and atraumatic. Eyes:  Sclera clear, no icterus.   Conjunctiva pink. Ears:  Normal auditory acuity. Neck:  Supple; no masses or thyromegaly. Lungs:  Respirations even and unlabored.  Clear throughout to auscultation.   No wheezes, crackles, or rhonchi. No acute distress. Heart:  Regular rate and rhythm; no murmurs, clicks, rubs, or gallops. Abdomen:  Normal bowel sounds.  No bruits.  Soft, non-tender and non-distended without masses, hepatosplenomegaly or hernias noted.  No guarding or rebound tenderness.  Negative  Carnett sign.   Rectal:  Deferred.  Msk:  Symmetrical without gross deformities.  Good, equal movement & strength bilaterally. Pulses:  Normal pulses noted. Extremities:  No clubbing or edema.  No cyanosis. Neurologic:  Alert and oriented x3;  grossly normal neurologically. Skin:  Intact without significant lesions or rashes.  No jaundice. Lymph Nodes:  No significant cervical adenopathy. Psych:  Alert and cooperative. Normal mood and affect.  Imaging Studies: No results found.  Assessment and Plan:   Sierra Moore is a 48 y.o. y/o female who comes in today with a history of epigastric/chest pressure.  She reported to radiate down the right arm.  The patient's symptoms are most consistent with esophageal spasms.  She does report that she has had a history of reflux in the past.  Does not take anything on a regular basis for the reflux.  There is no report of any dysphasia fevers chills black stools or bloody stools.  The patient also denies any family history of colon cancer or colon polyps.  The patient will be set up for a EGD and colonoscopy.  The colonoscopy will be for screening purposes and the EGD is to rule out any hiatal hernia in addition to Barrett's esophagus or unlikely peptic ulcer disease since her symptoms quickly resolve after she has these attacks. I have discussed risks & benefits which include, but are not limited to, bleeding, infection, perforation & drug reaction.  The patient agrees with this plan & written consent will be obtained.     This visit consisted of 45 minutes face to face contact with myself and at least 70% of this time was spent in counseling and education regarding diagnosis, treatment options, medication management, risks and benefits of treatment.  Lucilla Lame, MD. Marval Regal    Note: This dictation was prepared with Dragon dictation along with smaller phrase technology. Any transcriptional errors that result from this process are unintentional.

## 2019-04-19 NOTE — H&P (View-Only) (Signed)
Gastroenterology Consultation  Referring Provider:     Burnard Hawthorne, FNP Primary Care Physician:  Burnard Hawthorne, FNP Primary Gastroenterologist:  Dr. Allen Norris     Reason for Consultation:     Epigastric pain        HPI:   Sierra Moore is a 48 y.o. y/o female referred for consultation & management of epigastric pain by Dr. Vidal Schwalbe, Yvetta Coder, FNP.  This patient comes in today after being seen in the emergency department back on September 3 for what was reported to be chest pain but the discharging diagnosis was abdominal pain in the epigastric area.  The patient had a CT scan of the abdomen that showed:  IMPRESSION: 1. No acute abnormality or explanation for abdominal pain. 2. Nonobstructing bilateral nephrolithiasis. 3. Colonic diverticulosis without diverticulitis. Tiny fat containing umbilical hernia.  The patient was noted to have some hemoglobin in her urine and a elevated white cell count at 17.8.  She had her gallbladder taken out on July 7 of this year.  The patient's liver function tests showed an isolated increased alkaline phosphatase at 139.  She was discharged from the emergency room with recommendations to follow-up with GI. The patient reports that she has had 2 similar episodes of this pain which she reports to be a pressure-like pain in the mid chest and epigastric area.  She reports that the first time she had it she was found to have gallstones and had her gallbladder taken out at that time and the second time it subsided on its own.  The patient reports that she was told that she may have an ulcer although when the pain subsided she did not have a residual pain and was back to her baseline.   Past Medical History:  Diagnosis Date   Allergy    Arthritis    POLYARTHRALGIA   GERD (gastroesophageal reflux disease)    Leg swelling    Obesity    PMDD (premenstrual dysphoric disorder)    PMDD (premenstrual dysphoric disorder)    Raynaud disease     Vitamin D deficiency     Past Surgical History:  Procedure Laterality Date   ROBOTIC ASSISTED LAPAROSCOPIC CHOLECYSTECTOMY N/A 01/10/2019   Procedure: ROBOTIC ASSISTED LAPAROSCOPIC CHOLECYSTECTOMY;  Surgeon: Jules Husbands, MD;  Location: [facility] ORS;  Service: General;  Laterality: N/A;   TONSILLECTOMY      Prior to Admission medications   Medication Sig Start Date End Date Taking? Authorizing Provider  LESSINA-28 0.1-20 MG-MCG tablet Take 1 tablet by mouth daily. 03/24/19   Shambley, Melody N, CNM  meloxicam (MOBIC) 15 MG tablet Take 15 mg by mouth daily.  08/31/18   [provider]    Family History  Problem Relation Age of Onset   Arthritis Mother    Raynaud syndrome Mother    Arthritis Father    Breast cancer Neg Hx    Bladder Cancer Neg Hx    Kidney cancer Neg Hx      Social History   Tobacco Use   Smoking status: Never Smoker   Smokeless tobacco: Never Used  Substance Use Topics   Alcohol use: No   Drug use: No    Allergies as of 04/19/2019 - Review Complete 03/24/2019  Allergen Reaction Noted   Penicillins Hives 10/21/2015    Review of Systems:    All systems reviewed and negative except where noted in HPI.   Physical Exam:  There were no vitals taken for this visit.  No LMP recorded. (Menstrual status: Oral contraceptives). General:   Alert,  Well-developed, well-nourished, pleasant and cooperative in NAD Head:  Normocephalic and atraumatic. Eyes:  Sclera clear, no icterus.   Conjunctiva pink. Ears:  Normal auditory acuity. Neck:  Supple; no masses or thyromegaly. Lungs:  Respirations even and unlabored.  Clear throughout to auscultation.   No wheezes, crackles, or rhonchi. No acute distress. Heart:  Regular rate and rhythm; no murmurs, clicks, rubs, or gallops. Abdomen:  Normal bowel sounds.  No bruits.  Soft, non-tender and non-distended without masses, hepatosplenomegaly or hernias noted.  No guarding or rebound tenderness.  Negative  Carnett sign.   Rectal:  Deferred.  Msk:  Symmetrical without gross deformities.  Good, equal movement & strength bilaterally. Pulses:  Normal pulses noted. Extremities:  No clubbing or edema.  No cyanosis. Neurologic:  Alert and oriented x3;  grossly normal neurologically. Skin:  Intact without significant lesions or rashes.  No jaundice. Lymph Nodes:  No significant cervical adenopathy. Psych:  Alert and cooperative. Normal mood and affect.  Imaging Studies: No results found.  Assessment and Plan:   Sierra Moore is a 48 y.o. y/o female who comes in today with a history of epigastric/chest pressure.  She reported to radiate down the right arm.  The patient's symptoms are most consistent with esophageal spasms.  She does report that she has had a history of reflux in the past.  Does not take anything on a regular basis for the reflux.  There is no report of any dysphasia fevers chills black stools or bloody stools.  The patient also denies any family history of colon cancer or colon polyps.  The patient will be set up for a EGD and colonoscopy.  The colonoscopy will be for screening purposes and the EGD is to rule out any hiatal hernia in addition to Barrett's esophagus or unlikely peptic ulcer disease since her symptoms quickly resolve after she has these attacks. I have discussed risks & benefits which include, but are not limited to, bleeding, infection, perforation & drug reaction.  The patient agrees with this plan & written consent will be obtained.     This visit consisted of 45 minutes face to face contact with myself and at least 70% of this time was spent in counseling and education regarding diagnosis, treatment options, medication management, risks and benefits of treatment.  Lucilla Lame, MD. Marval Regal    Note: This dictation was prepared with Dragon dictation along with smaller phrase technology. Any transcriptional errors that result from this process are unintentional.

## 2019-04-24 ENCOUNTER — Other Ambulatory Visit: Payer: Self-pay

## 2019-04-24 ENCOUNTER — Other Ambulatory Visit
Admission: RE | Admit: 2019-04-24 | Discharge: 2019-04-24 | Disposition: A | Discharge status: Home / Self Care | Payer: Federal, State, Local not specified - PPO | Source: Ambulatory Visit | Attending: Gastroenterology | Admitting: Gastroenterology

## 2019-04-24 DIAGNOSIS — Z20828 Contact with and (suspected) exposure to other viral communicable diseases: Secondary | ICD-10-CM | POA: Insufficient documentation

## 2019-04-24 DIAGNOSIS — Z01812 Encounter for preprocedural laboratory examination: Secondary | ICD-10-CM | POA: Insufficient documentation

## 2019-04-25 LAB — SARS CORONAVIRUS 2 (TAT 6-24 HRS): SARS Coronavirus 2: NEGATIVE

## 2019-04-26 NOTE — Discharge Instructions (Signed)

## 2019-04-27 ENCOUNTER — Encounter
Admission: RE | Disposition: A | Discharge status: Home / Self Care | Payer: Self-pay | Source: Home / Self Care | Attending: Gastroenterology

## 2019-04-27 ENCOUNTER — Other Ambulatory Visit: Payer: Self-pay

## 2019-04-27 ENCOUNTER — Ambulatory Visit
Admission: RE | Admit: 2019-04-27 | Discharge: 2019-04-27 | Disposition: A | Discharge status: Home / Self Care | Payer: Federal, State, Local not specified - PPO | Attending: Gastroenterology | Admitting: Gastroenterology

## 2019-04-27 ENCOUNTER — Ambulatory Visit: Payer: Federal, State, Local not specified - PPO | Admitting: Anesthesiology

## 2019-04-27 DIAGNOSIS — K5730 Diverticulosis of large intestine without perforation or abscess without bleeding: Secondary | ICD-10-CM | POA: Insufficient documentation

## 2019-04-27 DIAGNOSIS — Z9049 Acquired absence of other specified parts of digestive tract: Secondary | ICD-10-CM | POA: Insufficient documentation

## 2019-04-27 DIAGNOSIS — K621 Rectal polyp: Secondary | ICD-10-CM | POA: Diagnosis not present

## 2019-04-27 DIAGNOSIS — K219 Gastro-esophageal reflux disease without esophagitis: Secondary | ICD-10-CM

## 2019-04-27 DIAGNOSIS — D124 Benign neoplasm of descending colon: Secondary | ICD-10-CM | POA: Diagnosis not present

## 2019-04-27 DIAGNOSIS — R0789 Other chest pain: Secondary | ICD-10-CM | POA: Diagnosis not present

## 2019-04-27 DIAGNOSIS — Z791 Long term (current) use of non-steroidal anti-inflammatories (NSAID): Secondary | ICD-10-CM | POA: Insufficient documentation

## 2019-04-27 DIAGNOSIS — K635 Polyp of colon: Secondary | ICD-10-CM

## 2019-04-27 DIAGNOSIS — E669 Obesity, unspecified: Secondary | ICD-10-CM | POA: Insufficient documentation

## 2019-04-27 DIAGNOSIS — Z6837 Body mass index (BMI) 37.0-37.9, adult: Secondary | ICD-10-CM | POA: Insufficient documentation

## 2019-04-27 DIAGNOSIS — M1990 Unspecified osteoarthritis, unspecified site: Secondary | ICD-10-CM | POA: Diagnosis not present

## 2019-04-27 DIAGNOSIS — Z1211 Encounter for screening for malignant neoplasm of colon: Secondary | ICD-10-CM

## 2019-04-27 DIAGNOSIS — Z793 Long term (current) use of hormonal contraceptives: Secondary | ICD-10-CM | POA: Diagnosis not present

## 2019-04-27 HISTORY — PX: COLONOSCOPY WITH PROPOFOL: SHX5780

## 2019-04-27 HISTORY — PX: ESOPHAGOGASTRODUODENOSCOPY (EGD) WITH PROPOFOL: SHX5813

## 2019-04-27 HISTORY — PX: POLYPECTOMY: SHX5525

## 2019-04-27 SURGERY — COLONOSCOPY WITH PROPOFOL
Anesthesia: General | Site: Rectum

## 2019-04-27 MED ORDER — LACTATED RINGERS IV SOLN
100.0000 mL/h | INTRAVENOUS | Status: DC
  Administered 2019-04-27: 09:00:00 100 mL/h via INTRAVENOUS

## 2019-04-27 MED ORDER — GLYCOPYRROLATE 0.2 MG/ML IJ SOLN
INTRAMUSCULAR | Status: DC | PRN
  Administered 2019-04-27: 0.1 mg via INTRAVENOUS

## 2019-04-27 MED ORDER — SODIUM CHLORIDE 0.9 % IV SOLN: INTRAVENOUS | Status: DC

## 2019-04-27 MED ORDER — LIDOCAINE HCL (CARDIAC) PF 100 MG/5ML IV SOSY
PREFILLED_SYRINGE | INTRAVENOUS | Status: DC | PRN
  Administered 2019-04-27: 30 mg via INTRAVENOUS

## 2019-04-27 MED ORDER — PROPOFOL 10 MG/ML IV BOLUS
INTRAVENOUS | Status: DC | PRN
  Administered 2019-04-27: 30 mg via INTRAVENOUS
  Administered 2019-04-27: 20 mg via INTRAVENOUS
  Administered 2019-04-27: 30 mg via INTRAVENOUS
  Administered 2019-04-27: 150 mg via INTRAVENOUS
  Administered 2019-04-27 (×6): 30 mg via INTRAVENOUS

## 2019-04-27 MED ORDER — STERILE WATER FOR IRRIGATION IR SOLN
Status: DC | PRN
  Administered 2019-04-27 (×2): 30 mL

## 2019-04-27 SURGICAL SUPPLY — 7 items
BLOCK BITE 60FR ADLT L/F GRN (MISCELLANEOUS) ×3 IMPLANT
CANISTER SUCT 1200ML W/VALVE (MISCELLANEOUS) ×3 IMPLANT
FORCEPS BIOP RAD 4 LRG CAP 4 (CUTTING FORCEPS) ×3 IMPLANT
GOWN CVR UNV OPN BCK APRN NK (MISCELLANEOUS) ×4 IMPLANT
GOWN ISOL THUMB LOOP REG UNIV (MISCELLANEOUS) ×2
KIT ENDO PROCEDURE OLY (KITS) ×3 IMPLANT
WATER STERILE IRR 250ML POUR (IV SOLUTION) ×3 IMPLANT

## 2019-04-27 NOTE — Interval H&P Note (Signed)
History and Physical Interval Note:  04/27/2019 9:22 AM  Sierra Moore  has presented today for surgery, with the diagnosis of Screening Z12.11 GERD K21.9.  The various methods of treatment have been discussed with the patient and family. After consideration of risks, benefits and other options for treatment, the patient has consented to  Procedure(s): COLONOSCOPY WITH PROPOFOL (N/A) ESOPHAGOGASTRODUODENOSCOPY (EGD) WITH PROPOFOL (N/A) as a surgical intervention.  The patient's history has been reviewed, patient examined, no change in status, stable for surgery.  I have reviewed the patient's chart and labs.  Questions were answered to the patient's satisfaction.     Stephaine Breshears [employer]

## 2019-04-27 NOTE — Op Note (Signed)
[facility] Gastroenterology Patient Name: Sierra Moore Procedure Date: 04/27/2019 9:48 AM MRN: [id number] Account #: [id number] Date of Birth: [birth year] Admit Type: Outpatient Age: 48 Room: [facility] OR ROOM 01 Gender: Female Note Status: Finalized Procedure:            Upper GI endoscopy Indications:          Chest pain (non cardiac) Providers:            Lucilla Lame MD, MD Referring MD:         Yvetta Coder. Arnett (Referring MD) Medicines:            Propofol per Anesthesia Complications:        No immediate complications. Procedure:            Pre-Anesthesia Assessment:                       - Prior to the procedure, a History and Physical was                        performed, and patient medications and allergies were                        reviewed. The patient's tolerance of previous                        anesthesia was also reviewed. The risks and benefits of                        the procedure and the sedation options and risks were                        discussed with the patient. All questions were                        answered, and informed consent was obtained. Prior                        Anticoagulants: The patient has taken no previous                        anticoagulant or antiplatelet agents. ASA Grade                        Assessment: II - A patient with mild systemic disease.                        After reviewing the risks and benefits, the patient was                        deemed in satisfactory condition to undergo the                        procedure.                       After obtaining informed consent, the endoscope was                        passed under direct vision. Throughout the procedure,  the patient's blood pressure, pulse, and oxygen                        saturations were monitored continuously. The Endoscope                        was introduced through the mouth, and advanced to the              second part of duodenum. The upper GI endoscopy was                        accomplished without difficulty. The patient tolerated                        the procedure well. Findings:      The esophagus was normal.      The stomach was normal.      The examined duodenum was normal. Impression:           - Normal esophagus.                       - Normal stomach.                       - Normal examined duodenum.                       - No specimens collected. Recommendation:       - Discharge patient to home.                       - Resume previous diet.                       - Continue present medications.                       - Perform a colonoscopy today. Procedure Code(s):    --- Professional ---                       [id number], Esophagogastroduodenoscopy, flexible, transoral;                        diagnostic, including collection of specimen(s) by                        brushing or washing, when performed (separate procedure) Diagnosis Code(s):    --- Professional ---                       R07.89, Other chest pain CPT copyright 2019 American Medical Association. All rights reserved. The codes documented in this report are preliminary and upon coder review may  be revised to meet current compliance requirements. Lucilla Lame MD, MD 04/27/2019 10:06:13 AM This report has been signed electronically. Number of Addenda: 0 Note Initiated On: 04/27/2019 9:48 AM Estimated Blood Loss: Estimated blood loss: none.      [facility]

## 2019-04-27 NOTE — Transfer of Care (Signed)
Immediate Anesthesia Transfer of Care Note  Patient: Sierra Moore  Procedure(s) Performed: COLONOSCOPY WITH BIOPSY (N/A Rectum) ESOPHAGOGASTRODUODENOSCOPY (EGD) WITH PROPOFOL (N/A Mouth) POLYPECTOMY (N/A Rectum)  Patient Location: PACU  Anesthesia Type: General  Level of Consciousness: awake, alert  and patient cooperative  Airway and Oxygen Therapy: Patient Spontanous Breathing and Patient connected to supplemental oxygen  Post-op Assessment: Post-op Vital signs reviewed, Patient's Cardiovascular Status Stable, Respiratory Function Stable, Patent Airway and No signs of Nausea or vomiting  Post-op Vital Signs: Reviewed and stable  Complications: No apparent anesthesia complications

## 2019-04-27 NOTE — Anesthesia Procedure Notes (Signed)
Date/Time: 04/27/2019 9:54 AM Performed by: Cameron Ali, CRNA Pre-anesthesia Checklist: Patient identified, Emergency Drugs available, Suction available, Timeout performed and Patient being monitored Patient Re-evaluated:Patient Re-evaluated prior to induction Oxygen Delivery Method: Nasal cannula Placement Confirmation: positive ETCO2

## 2019-04-27 NOTE — Anesthesia Preprocedure Evaluation (Signed)
Anesthesia Evaluation  Patient identified by MRN, date of birth, ID band Patient awake    Reviewed: Allergy & Precautions, H&P , NPO status , Patient's Chart, lab work & pertinent test results  Airway Mallampati: II  TM Distance: >3 FB Neck ROM: full    Dental no notable dental hx.    Pulmonary    Pulmonary exam normal breath sounds clear to auscultation       Cardiovascular Normal cardiovascular exam Rhythm:regular Rate:Normal     Neuro/Psych    GI/Hepatic GERD  ,  Endo/Other    Renal/GU      Musculoskeletal   Abdominal   Peds  Hematology   Anesthesia Other Findings   Reproductive/Obstetrics                             Anesthesia Physical Anesthesia Plan  ASA: II  Anesthesia Plan: General   Post-op Pain Management:    Induction: Intravenous  PONV Risk Score and Plan: 3 and Propofol infusion, Treatment may vary due to age or medical condition and TIVA  Airway Management Planned: Natural Airway  Additional Equipment:   Intra-op Plan:   Post-operative Plan:   Informed Consent: I have reviewed the patients History and Physical, chart, labs and discussed the procedure including the risks, benefits and alternatives for the proposed anesthesia with the patient or authorized representative who has indicated his/her understanding and acceptance.       Plan Discussed with: CRNA  Anesthesia Plan Comments:         Anesthesia Quick Evaluation

## 2019-04-27 NOTE — Anesthesia Postprocedure Evaluation (Signed)
Anesthesia Post Note  Patient: Sierra Moore  Procedure(s) Performed: COLONOSCOPY WITH BIOPSY (N/A Rectum) ESOPHAGOGASTRODUODENOSCOPY (EGD) WITH PROPOFOL (N/A Mouth) POLYPECTOMY (N/A Rectum)  Patient location during evaluation: PACU Anesthesia Type: General Level of consciousness: awake and alert and oriented Pain management: satisfactory to patient Vital Signs Assessment: post-procedure vital signs reviewed and stable Respiratory status: spontaneous breathing, nonlabored ventilation and respiratory function stable Cardiovascular status: blood pressure returned to baseline and stable Postop Assessment: Adequate PO intake and No signs of nausea or vomiting Anesthetic complications: no    Raliegh Ip

## 2019-04-27 NOTE — Op Note (Signed)
[facility] Gastroenterology Patient Name: Sierra Moore Procedure Date: 04/27/2019 9:54 AM MRN: [id number] Account #: [id number] Date of Birth: May 07, 1971 Admit Type: Outpatient Age: 48 Room: [facility] OR ROOM 01 Gender: Female Note Status: Finalized Procedure:            Colonoscopy Indications:          Screening for colorectal malignant neoplasm Providers:            Lucilla Lame MD, MD Referring MD:         Yvetta Coder. Arnett (Referring MD) Medicines:            Propofol per Anesthesia Complications:        No immediate complications. Procedure:            Pre-Anesthesia Assessment:                       - Prior to the procedure, a History and Physical was                        performed, and patient medications and allergies were                        reviewed. The patient's tolerance of previous                        anesthesia was also reviewed. The risks and benefits of                        the procedure and the sedation options and risks were                        discussed with the patient. All questions were                        answered, and informed consent was obtained. Prior                        Anticoagulants: The patient has taken no previous                        anticoagulant or antiplatelet agents. ASA Grade                        Assessment: II - A patient with mild systemic disease.                        After reviewing the risks and benefits, the patient was                        deemed in satisfactory condition to undergo the                        procedure.                       After obtaining informed consent, the colonoscope was                        passed under direct vision. Throughout the procedure,  the patient's blood pressure, pulse, and oxygen                        saturations were monitored continuously. The was                        introduced through the anus and advanced to the the                    cecum, identified by appendiceal orifice and ileocecal                        valve. The colonoscopy was performed without                        difficulty. The patient tolerated the procedure well.                        The quality of the bowel preparation was excellent. Findings:      The perianal and digital rectal examinations were normal.      A 3 mm polyp was found in the rectum. The polyp was sessile. The polyp       was removed with a cold biopsy forceps. Resection and retrieval were       complete.      Multiple small-mouthed diverticula were found in the sigmoid colon.      A 3 mm polyp was found in the descending colon. The polyp was sessile.       The polyp was removed with a cold biopsy forceps. Resection and       retrieval were complete. Impression:           - One 3 mm polyp in the rectum, removed with a cold                        biopsy forceps. Resected and retrieved.                       - Diverticulosis in the sigmoid colon.                       - One 3 mm polyp in the descending colon, removed with                        a cold biopsy forceps. Resected and retrieved. Recommendation:       - Discharge patient to home.                       - Resume previous diet.                       - Continue present medications.                       - Await pathology results.                       - Repeat colonoscopy in 5 years if polyp adenoma and 10                        years if hyperplastic Procedure Code(s):    --- Professional ---  45380, Colonoscopy, flexible; with biopsy, single or                        multiple Diagnosis Code(s):    --- Professional ---                       Z12.11, Encounter for screening for malignant neoplasm                        of colon                       K62.1, Rectal polyp                       K63.5, Polyp of colon CPT copyright 2019 American Medical Association. All rights reserved. The  codes documented in this report are preliminary and upon coder review may  be revised to meet current compliance requirements. Lucilla Lame MD, MD 04/27/2019 10:19:56 AM This report has been signed electronically. Number of Addenda: 0 Note Initiated On: 04/27/2019 9:54 AM Scope Withdrawal Time: 0 hours 6 minutes 51 seconds  Total Procedure Duration: 0 hours 9 minutes 18 seconds  Estimated Blood Loss: Estimated blood loss: none.      [facility]

## 2019-04-28 ENCOUNTER — Encounter: Payer: Self-pay | Admitting: Gastroenterology

## 2019-05-01 ENCOUNTER — Encounter: Payer: Self-pay | Admitting: Gastroenterology

## 2019-05-08 DIAGNOSIS — L718 Other rosacea: Secondary | ICD-10-CM | POA: Diagnosis not present

## 2019-05-08 DIAGNOSIS — Z85828 Personal history of other malignant neoplasm of skin: Secondary | ICD-10-CM | POA: Diagnosis not present

## 2019-05-08 DIAGNOSIS — Q828 Other specified congenital malformations of skin: Secondary | ICD-10-CM | POA: Diagnosis not present

## 2019-05-08 DIAGNOSIS — L578 Other skin changes due to chronic exposure to nonionizing radiation: Secondary | ICD-10-CM | POA: Diagnosis not present

## 2019-05-09 DIAGNOSIS — I7300 Raynaud's syndrome without gangrene: Secondary | ICD-10-CM | POA: Diagnosis not present

## 2019-05-09 DIAGNOSIS — M170 Bilateral primary osteoarthritis of knee: Secondary | ICD-10-CM | POA: Diagnosis not present

## 2019-05-09 DIAGNOSIS — E669 Obesity, unspecified: Secondary | ICD-10-CM | POA: Diagnosis not present

## 2019-05-09 DIAGNOSIS — M1712 Unilateral primary osteoarthritis, left knee: Secondary | ICD-10-CM | POA: Insufficient documentation

## 2019-05-31 DIAGNOSIS — M7050 Other bursitis of knee, unspecified knee: Secondary | ICD-10-CM | POA: Diagnosis not present

## 2019-05-31 DIAGNOSIS — M25561 Pain in right knee: Secondary | ICD-10-CM | POA: Diagnosis not present

## 2019-05-31 DIAGNOSIS — M7051 Other bursitis of knee, right knee: Secondary | ICD-10-CM | POA: Diagnosis not present

## 2019-05-31 DIAGNOSIS — M25461 Effusion, right knee: Secondary | ICD-10-CM | POA: Diagnosis not present

## 2019-05-31 DIAGNOSIS — M1711 Unilateral primary osteoarthritis, right knee: Secondary | ICD-10-CM | POA: Diagnosis not present

## 2019-06-07 ENCOUNTER — Ambulatory Visit
Admission: RE | Admit: 2019-06-07 | Discharge: 2019-06-07 | Disposition: A | Discharge status: Home / Self Care | Payer: Federal, State, Local not specified - PPO | Source: Ambulatory Visit | Attending: Obstetrics and Gynecology | Admitting: Obstetrics and Gynecology

## 2019-06-07 DIAGNOSIS — Z01419 Encounter for gynecological examination (general) (routine) without abnormal findings: Secondary | ICD-10-CM

## 2019-06-07 DIAGNOSIS — Z1231 Encounter for screening mammogram for malignant neoplasm of breast: Secondary | ICD-10-CM | POA: Insufficient documentation

## 2019-06-15 DIAGNOSIS — F411 Generalized anxiety disorder: Secondary | ICD-10-CM | POA: Diagnosis not present

## 2019-06-22 ENCOUNTER — Other Ambulatory Visit: Payer: Self-pay

## 2019-06-22 ENCOUNTER — Ambulatory Visit: Payer: Federal, State, Local not specified - PPO | Admitting: Gastroenterology

## 2019-06-22 ENCOUNTER — Other Ambulatory Visit
Admission: RE | Admit: 2019-06-22 | Discharge: 2019-06-22 | Disposition: A | Discharge status: Home / Self Care | Payer: Federal, State, Local not specified - PPO | Source: Ambulatory Visit | Attending: Gastroenterology | Admitting: Gastroenterology

## 2019-06-22 ENCOUNTER — Encounter: Payer: Self-pay | Admitting: Gastroenterology

## 2019-06-22 VITALS — BP 136/77 | HR 111 | Temp 98.1°F | Ht 61.0 in | Wt 204.0 lb

## 2019-06-22 DIAGNOSIS — R748 Abnormal levels of other serum enzymes: Secondary | ICD-10-CM | POA: Diagnosis not present

## 2019-06-22 LAB — GAMMA GT: GGT: 24 U/L (ref 7–50)

## 2019-06-22 NOTE — Progress Notes (Signed)
Primary Care Physician: Burnard Hawthorne, FNP  Primary Gastroenterologist:  Dr. Lucilla Lame  Chief Complaint  Patient presents with  . Elevated Hepatic Enzymes    HPI: Sierra Moore is a 48 y.o. female here for follow-up after having EGD and colonoscopy.  The patient's EGD was normal and the colonoscopy showed 2 polyps 1 of which was a tubular adenoma.  The patient states she has been doing well and was concerned that 5 years may be a long time to wait since she had a precancerous polyp.  Back in September the patient was found to have an elevated alkaline phosphatase which she has new for her.  She denies alcohol abuse or any other history of liver issues.  Current Outpatient Medications  Medication Sig Dispense Refill  . diclofenac Sodium (VOLTAREN) 1 % GEL Apply topically.    . LESSINA-28 0.1-20 MG-MCG tablet Take 1 tablet by mouth daily. 84 tablet 2  . meloxicam (MOBIC) 15 MG tablet Take 15 mg by mouth daily.      No current facility-administered medications for this visit.    Allergies as of 06/22/2019 - Review Complete 06/22/2019  Allergen Reaction Noted  . Penicillins Hives 10/21/2015    ROS:  General: Negative for anorexia, weight loss, fever, chills, fatigue, weakness. ENT: Negative for hoarseness, difficulty swallowing , nasal congestion. CV: Negative for chest pain, angina, palpitations, dyspnea on exertion, peripheral edema.  Respiratory: Negative for dyspnea at rest, dyspnea on exertion, cough, sputum, wheezing.  GI: See history of present illness. GU:  Negative for dysuria, hematuria, urinary incontinence, urinary frequency, nocturnal urination.  Endo: Negative for unusual weight change.    Physical Examination:   BP 136/77   Pulse (!) 111   Temp 98.1 F (36.7 C) (Oral)   Ht 5\' 1"  (1.549 m)   Wt 204 lb (92.5 kg)   BMI 38.55 kg/m   General: Well-nourished, well-developed in no acute distress.  Eyes: No icterus. Conjunctivae pink. Extremities: No  lower extremity edema. No clubbing or deformities. Neuro: Alert and oriented x 3.  Grossly intact. Skin: Warm and dry, no jaundice.   Psych: Alert and cooperative, normal mood and affect.  Labs:    Imaging Studies: MM 3D SCREEN BREAST BILATERAL  Result Date: 06/07/2019 CLINICAL DATA:  Screening. EXAM: DIGITAL SCREENING BILATERAL MAMMOGRAM WITH TOMO AND CAD COMPARISON:  Previous exam(s). ACR Breast Density Category b: There are scattered areas of fibroglandular density. FINDINGS: There are no findings suspicious for malignancy. Images were processed with CAD. IMPRESSION: No mammographic evidence of malignancy. A result letter of this screening mammogram will be mailed directly to the patient. RECOMMENDATION: Screening mammogram in one year. (Code:SM-B-01Y) BI-RADS CATEGORY  1: Negative. Electronically Signed   By: Lillia Mountain M.D.   On: 06/07/2019 09:41    Assessment and Plan:   [name] is a 48 y.o. y/o female who comes in today for follow-up after having EGD and colonoscopy.  The patient was told that the one polyp does not require a more frequent colonoscopy than what was recommended.  The patient has also been told that her alkaline phosphatase has been elevated and she will have it repeated with fractionation and a GGT sent off.  The patient will be notified of the results.  The patient has been explained the plan and agrees with it.     Lucilla Lame, MD. Marval Regal    Note: This dictation was prepared with Dragon dictation along with smaller phrase technology. Any transcriptional  errors that result from this process are unintentional.

## 2019-06-27 ENCOUNTER — Telehealth: Payer: Self-pay

## 2019-06-27 DIAGNOSIS — R748 Abnormal levels of other serum enzymes: Secondary | ICD-10-CM

## 2019-06-27 LAB — ALKALINE PHOSPHATASE, ISOENZYMES
Alk Phos Bone Fract: 42 % (ref 14–68)
Alk Phos Liver Fract: 58 % (ref 18–85)
Alk Phos: 122 IU/L — ABNORMAL HIGH (ref 39–117)
Intestinal %: 0 % (ref 0–18)

## 2019-06-27 NOTE — Telephone Encounter (Signed)
Patient verbalized understanding of lab results. She will repeat labs in 2 months

## 2019-06-27 NOTE — Telephone Encounter (Signed)
-----  Message from Lucilla Lame, MD sent at 06/27/2019 10:04 AM EST ----- Let the patient know the alk phone is mostly from the bone but is coming down. We should repeat a blood test in 2 months to see if it goes back to normal.

## 2019-07-27 ENCOUNTER — Other Ambulatory Visit: Payer: Self-pay

## 2019-07-27 MED ORDER — LESSINA 0.1-20 MG-MCG PO TABS: 1.0000 | ORAL_TABLET | Freq: Every day | ORAL | 2 refills | Status: DC

## 2019-08-23 DIAGNOSIS — F411 Generalized anxiety disorder: Secondary | ICD-10-CM | POA: Diagnosis not present

## 2019-10-18 DIAGNOSIS — M25562 Pain in left knee: Secondary | ICD-10-CM | POA: Diagnosis not present

## 2019-10-18 DIAGNOSIS — M170 Bilateral primary osteoarthritis of knee: Secondary | ICD-10-CM | POA: Diagnosis not present

## 2019-11-08 DIAGNOSIS — M170 Bilateral primary osteoarthritis of knee: Secondary | ICD-10-CM | POA: Diagnosis not present

## 2019-11-30 DIAGNOSIS — F418 Other specified anxiety disorders: Secondary | ICD-10-CM | POA: Diagnosis not present

## 2019-12-07 DIAGNOSIS — F418 Other specified anxiety disorders: Secondary | ICD-10-CM | POA: Diagnosis not present

## 2019-12-23 DIAGNOSIS — R030 Elevated blood-pressure reading, without diagnosis of hypertension: Secondary | ICD-10-CM | POA: Diagnosis not present

## 2019-12-23 DIAGNOSIS — J309 Allergic rhinitis, unspecified: Secondary | ICD-10-CM | POA: Diagnosis not present

## 2019-12-23 DIAGNOSIS — G4719 Other hypersomnia: Secondary | ICD-10-CM | POA: Diagnosis not present

## 2019-12-23 DIAGNOSIS — R0683 Snoring: Secondary | ICD-10-CM | POA: Diagnosis not present

## 2019-12-25 ENCOUNTER — Encounter: Payer: Federal, State, Local not specified - PPO | Admitting: Certified Nurse Midwife

## 2019-12-26 ENCOUNTER — Other Ambulatory Visit: Payer: Self-pay

## 2019-12-26 ENCOUNTER — Ambulatory Visit
Admission: RE | Admit: 2019-12-26 | Discharge: 2019-12-26 | Disposition: A | Discharge status: Home / Self Care | Payer: Federal, State, Local not specified - PPO | Source: Ambulatory Visit | Attending: Emergency Medicine | Admitting: Emergency Medicine

## 2019-12-26 VITALS — BP 152/97 | HR 118 | Temp 98.1°F | Resp 18 | Ht 61.0 in | Wt 200.0 lb

## 2019-12-26 DIAGNOSIS — J0100 Acute maxillary sinusitis, unspecified: Secondary | ICD-10-CM

## 2019-12-26 DIAGNOSIS — H6691 Otitis media, unspecified, right ear: Secondary | ICD-10-CM

## 2019-12-26 MED ORDER — DOXYCYCLINE HYCLATE 100 MG PO CAPS: 100.0000 mg | ORAL_CAPSULE | Freq: Two times a day (BID) | ORAL | 0 refills | Status: AC

## 2019-12-26 NOTE — ED Triage Notes (Signed)
Patient complains of sinus pain and pressure with a runny nose x 9 days. States that [facility] and was told to continue to use Zyrtec and Flonase without relief.

## 2019-12-26 NOTE — ED Provider Notes (Signed)
Sierra Moore    CSN: [id number] Arrival date & time: 12/26/19  1248      History   Chief Complaint Chief Complaint  Patient presents with  . Facial Pain    HPI Sierra Moore is a 49 y.o. female.   Presents with 9-day history of sinus pressure, maxillary pain, congestion, postnasal drip, earache.  [facility]; treatment with Flonase and Zyrtec.  Patient reports her symptoms are not improving.  She denies fever, chills, rash, shortness of breath, vomiting, diarrhea, or other symptoms.  The history is provided by the patient.    Past Medical History:  Diagnosis Date  . Allergy   . Arthritis    POLYARTHRALGIA  . GERD (gastroesophageal reflux disease)   . Leg swelling   . Obesity   . PMDD (premenstrual dysphoric disorder)   . PMDD (premenstrual dysphoric disorder)   . Raynaud disease   . Vitamin D deficiency     Patient Active Problem List   Diagnosis Date Noted  . Primary osteoarthritis of both knees 05/09/2019  . Special screening for malignant neoplasms, colon   . Polyp of descending colon   . Other chest pain   . Onychomycosis of toenail 12/27/2018  . Polyarthralgia 12/27/2018  . HLD (hyperlipidemia) 12/21/2018  . Right foot pain 12/07/2018  . Eczema 03/04/2017  . Raynaud's phenomenon without gangrene 03/04/2017  . Dysuria 09/25/2016  . Mixed stress and urge urinary incontinence 09/25/2016  . Mild obesity 03/04/2016  . Seasonal allergies 02/10/2016  . Encounter for routine adult physical exam with abnormal findings 02/10/2016  . Heat intolerance 02/10/2016  . Hematuria 02/10/2016  . Vitamin D deficiency 10/25/2015  . DM (diabetes mellitus) ([place]) 10/25/2015  . IBS (irritable bowel syndrome) 10/24/2015    Past Surgical History:  Procedure Laterality Date  . COLONOSCOPY WITH PROPOFOL N/A 04/27/2019   Procedure: COLONOSCOPY WITH BIOPSY;  Surgeon: Lucilla Lame, MD;  Location: [place];  Service: Endoscopy;   Laterality: N/A;  . ESOPHAGOGASTRODUODENOSCOPY (EGD) WITH PROPOFOL N/A 04/27/2019   Procedure: ESOPHAGOGASTRODUODENOSCOPY (EGD) WITH PROPOFOL;  Surgeon: Lucilla Lame, MD;  Location: [place];  Service: Endoscopy;  Laterality: N/A;  . POLYPECTOMY N/A 04/27/2019   Procedure: POLYPECTOMY;  Surgeon: Lucilla Lame, MD;  Location: [place];  Service: Endoscopy;  Laterality: N/A;  . ROBOTIC ASSISTED LAPAROSCOPIC CHOLECYSTECTOMY N/A 01/10/2019   Procedure: ROBOTIC ASSISTED LAPAROSCOPIC CHOLECYSTECTOMY;  Surgeon: Jules Husbands, MD;  Location: [facility];  Service: General;  Laterality: N/A;  . TONSILLECTOMY      OB History    Gravida  2   Para      Term      Preterm      AB      Living  2     SAB      TAB      Ectopic      Multiple      Live Births  2            Home Medications    Prior to Admission medications   Medication Sig Start Date End Date Taking? Authorizing Provider  diclofenac Sodium (VOLTAREN) 1 % GEL Apply topically. 05/09/19  Yes [provider]  LESSINA-28 0.1-20 MG-MCG tablet Take 1 tablet by mouth daily. 07/27/19  Yes Rubie Maid, MD  meloxicam (MOBIC) 15 MG tablet Take 15 mg by mouth daily.  08/31/18  Yes [provider]  doxycycline (VIBRAMYCIN) 100 MG capsule Take 1 capsule (  100 mg total) by mouth 2 (two) times daily for 7 days. 12/26/19 01/02/20  Sharion Balloon, NP    Family History Family History  Problem Relation Age of Onset  . Arthritis Mother   . Raynaud syndrome Mother   . Arthritis Father   . Breast cancer Neg Hx   . Bladder Cancer Neg Hx   . Kidney cancer Neg Hx     Social History Social History   Tobacco Use  . Smoking status: Never Smoker  . Smokeless tobacco: Never Used  Vaping Use  . Vaping Use: Never used  Substance Use Topics  . Alcohol use: No  . Drug use: No     Allergies   Penicillins   Review of Systems Review of Systems  Constitutional: Negative for chills and fever.    HENT: Positive for congestion, ear pain, postnasal drip, rhinorrhea, sinus pressure and sinus pain. Negative for sore throat.   Eyes: Negative for pain and visual disturbance.  Respiratory: Negative for cough and shortness of breath.   Cardiovascular: Negative for chest pain and palpitations.  Gastrointestinal: Negative for abdominal pain, diarrhea, nausea and vomiting.  Genitourinary: Negative for dysuria and hematuria.  Musculoskeletal: Negative for arthralgias and back pain.  Skin: Negative for color change and rash.  Neurological: Negative for seizures and syncope.  All other systems reviewed and are negative.    Physical Exam Triage Vital Signs ED Triage Vitals [12/26/19 1251]  Enc Vitals Group     BP (!) 152/97     Pulse Rate (!) 118     Resp 18     Temp 98.1 F (36.7 C)     Temp Source Oral     SpO2 96 %     Weight 200 lb (90.7 kg)     Height 5\' 1"  (1.549 m)     Head Circumference      Peak Flow      Pain Score 5     Pain Loc      Pain Edu?      Excl. in [place]?    No data found.  Updated Vital Signs BP (!) 152/97 (BP Location: Left Arm)   Pulse (!) 118   Temp 98.1 F (36.7 C) (Oral)   Resp 18   Ht 5\' 1"  (1.549 m)   Wt 200 lb (90.7 kg)   SpO2 96%   BMI 37.79 kg/m   Visual Acuity Right Eye Distance:   Left Eye Distance:   Bilateral Distance:    Right Eye Near:   Left Eye Near:    Bilateral Near:     Physical Exam Vitals and nursing note reviewed.  Constitutional:      General: She is not in acute distress.    Appearance: She is well-developed. She is not ill-appearing.  HENT:     Head: Normocephalic and atraumatic.     Right Ear: Ear canal normal. Tympanic membrane is erythematous.     Left Ear: Tympanic membrane and ear canal normal.     Nose: Congestion and rhinorrhea present.     Mouth/Throat:     Mouth: Mucous membranes are moist.     Pharynx: Oropharynx is clear.  Eyes:     Conjunctiva/sclera: Conjunctivae normal.  Cardiovascular:      Rate and Rhythm: Normal rate and regular rhythm.     Heart sounds: No murmur heard.   Pulmonary:     Effort: Pulmonary effort is normal. No respiratory distress.     Breath sounds:  Normal breath sounds.  Abdominal:     Palpations: Abdomen is soft.     Tenderness: There is no abdominal tenderness. There is no guarding or rebound.  Musculoskeletal:     Cervical back: Neck supple.  Skin:    General: Skin is warm and dry.     Findings: No rash.  Neurological:     General: No focal deficit present.     Mental Status: She is alert and oriented to person, place, and time.     Gait: Gait normal.  Psychiatric:        Mood and Affect: Mood normal.        Behavior: Behavior normal.      UC Treatments / Results  Labs (all labs ordered are listed, but only abnormal results are displayed) Labs Reviewed  NOVEL CORONAVIRUS, NAA    EKG   Radiology No results found.  Procedures Procedures (including critical care time)  Medications Ordered in UC Medications - No data to display  Initial Impression / Assessment and Plan / UC Course  I have reviewed the triage vital signs and the nursing notes.  Pertinent labs & imaging results that were available during my care of the patient were reviewed by me and considered in my medical decision making (see chart for details).   Acute sinusitis, right otitis media.  Treating with doxycycline.  Instructed patient to take Tylenol or ibuprofen as needed for discomfort.  Instructed her to follow-up with her PCP if her symptoms are not improving.  Patient agrees to plan of care.     Final Clinical Impressions(s) / UC Diagnoses   Final diagnoses:  Acute non-recurrent maxillary sinusitis  Right otitis media, unspecified otitis media type     Discharge Instructions     Take the doxycycline as directed.  Take Tylenol or ibuprofen as needed for discomfort.    Follow up with your primary care provider if your symptoms are not improving.        ED Prescriptions    Medication Sig Dispense Auth. Provider   doxycycline (VIBRAMYCIN) 100 MG capsule Take 1 capsule (100 mg total) by mouth 2 (two) times daily for 7 days. 14 capsule Sharion Balloon, NP     PDMP not reviewed this encounter.   Sharion Balloon, NP 12/26/19 1314

## 2019-12-26 NOTE — Discharge Instructions (Signed)
Take the doxycycline as directed.  Take Tylenol or ibuprofen as needed for discomfort.    Follow up with your primary care provider if your symptoms are not improving.

## 2019-12-27 LAB — SARS-COV-2, NAA 2 DAY TAT

## 2019-12-27 LAB — NOVEL CORONAVIRUS, NAA: SARS-CoV-2, NAA: NOT DETECTED

## 2019-12-30 IMAGING — MG DIGITAL SCREENING BILAT W/ TOMO W/ CAD
8 series · 8 of 24 positions shown · non-contrast
Comparison: Previous exam(s).

CLINICAL DATA: Screening.

EXAM:
DIGITAL SCREENING BILATERAL MAMMOGRAM WITH TOMO AND CAD

[R CC synth-2D]
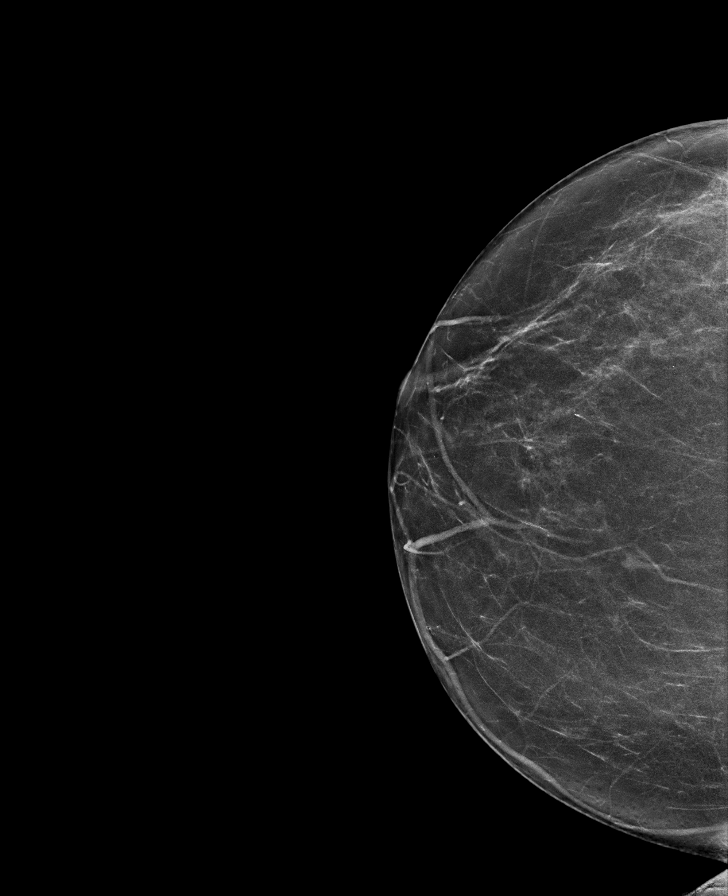

[L MLO synth-2D]
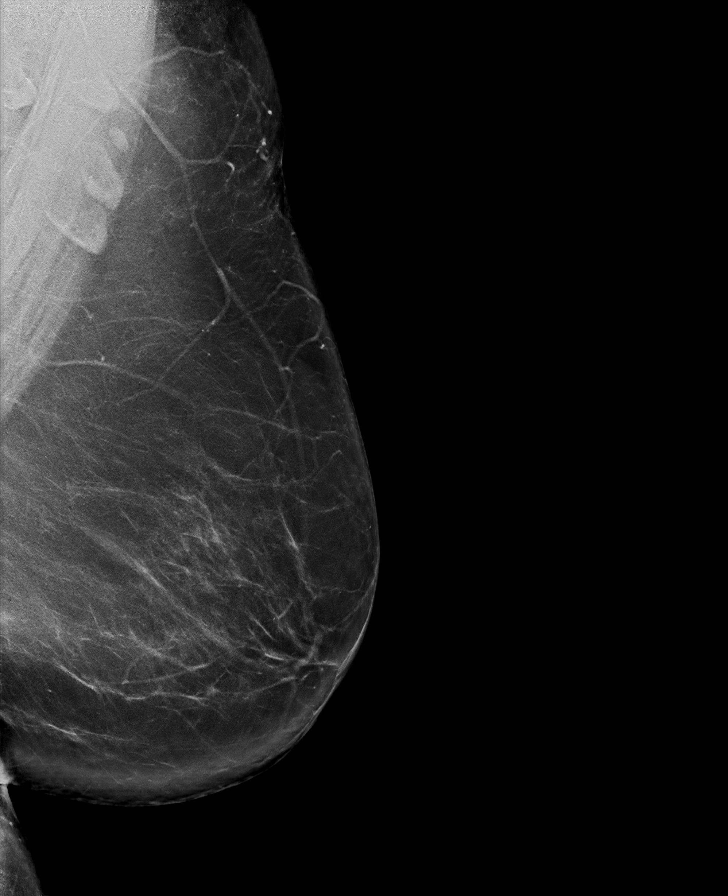

[L CC synth-2D]
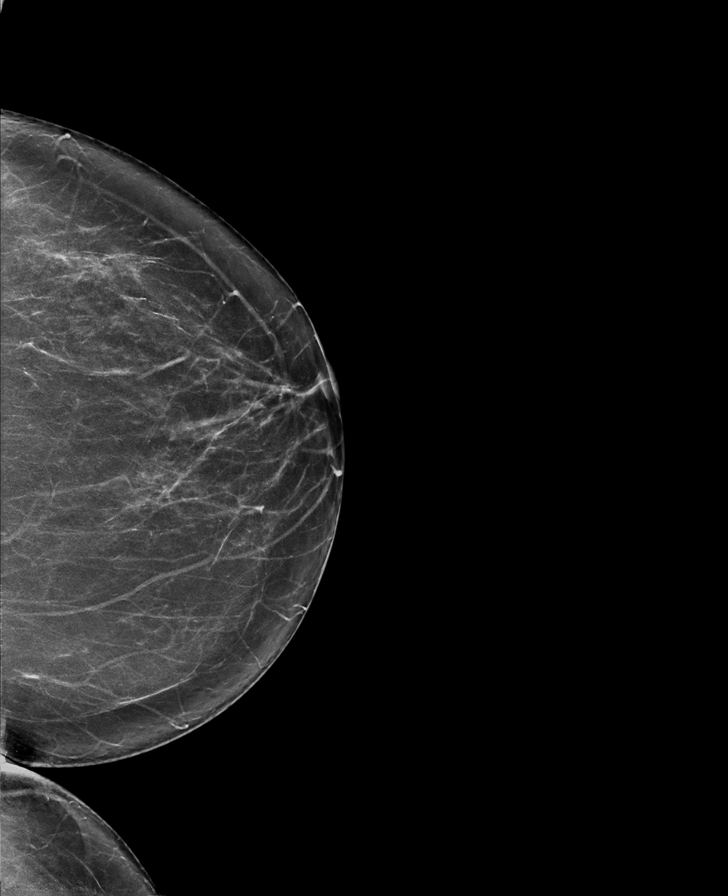

[R MLO synth-2D]
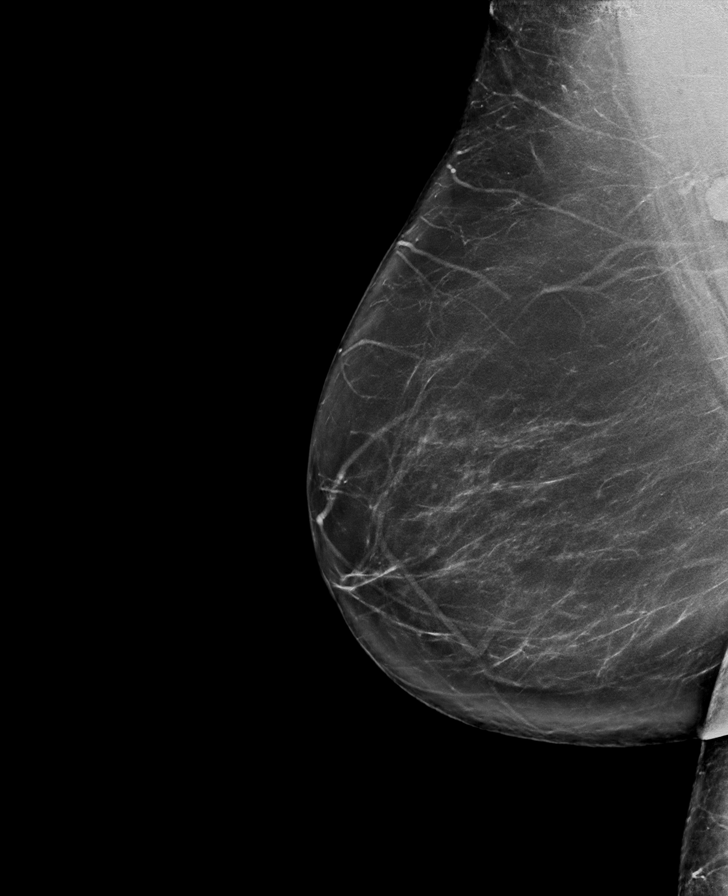

[R CC tomo · tomo slice 34/67.0]
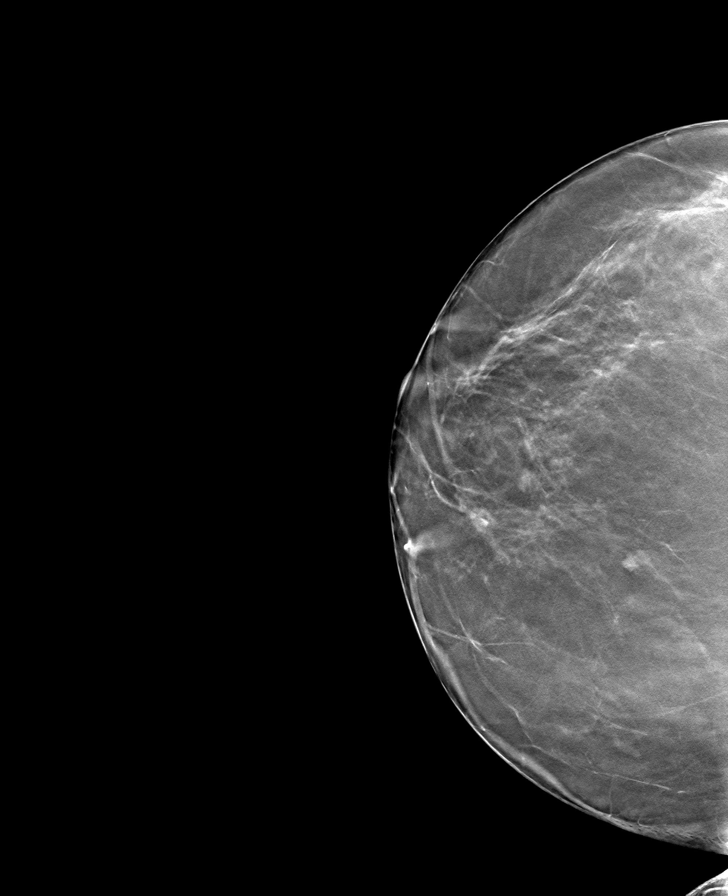

[L MLO tomo · tomo slice 47/93.0]
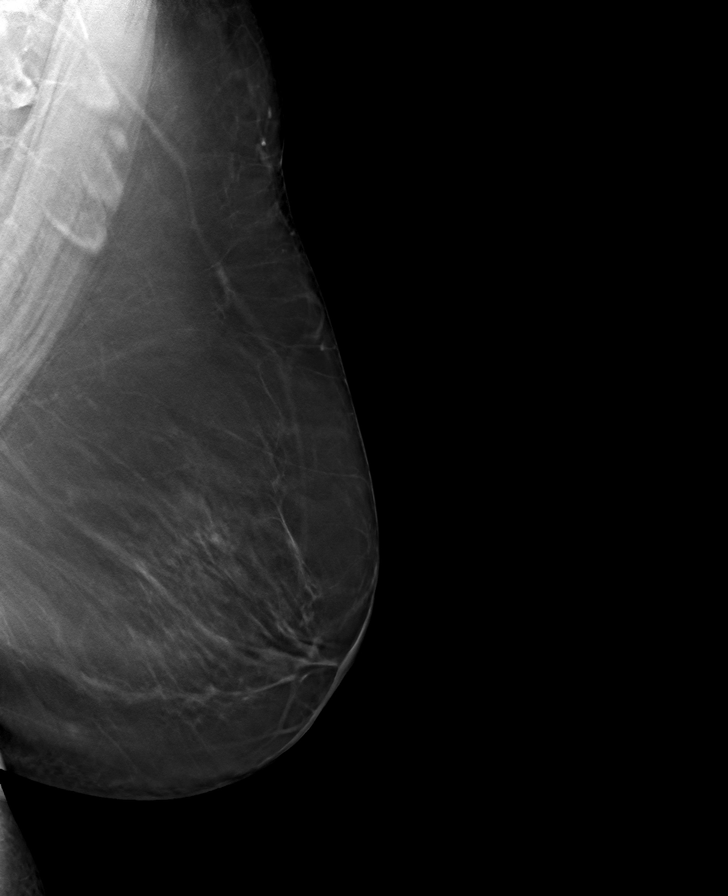

[R MLO tomo · tomo slice 43/86.0]
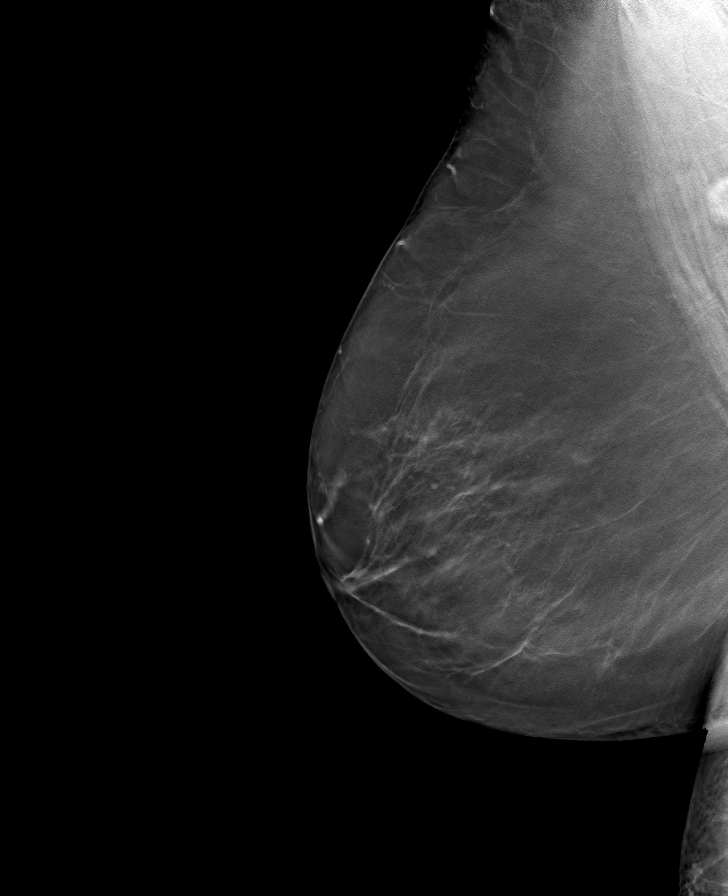

[L CC tomo · tomo slice 35/70.0]
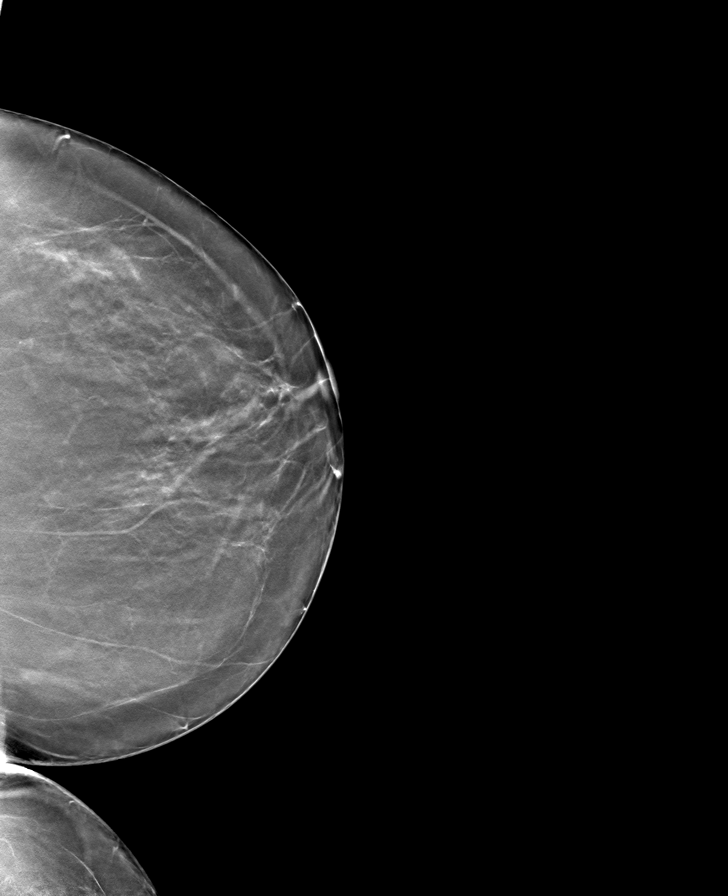

[8 of 24 positions shown; findings below may reference images not displayed]

ACR Breast Density Category b: There are scattered areas of
fibroglandular density.
FINDINGS: There are no findings suspicious for malignancy. Images were
processed with CAD.
IMPRESSION: No mammographic evidence of malignancy. A result letter of this
screening mammogram will be mailed directly to the patient.

RECOMMENDATION:
Screening mammogram in one year. (Code:[id number]-U-775)

BI-RADS CATEGORY  1: Negative.

## 2020-01-01 DIAGNOSIS — F411 Generalized anxiety disorder: Secondary | ICD-10-CM | POA: Diagnosis not present

## 2020-01-02 ENCOUNTER — Other Ambulatory Visit: Payer: Self-pay

## 2020-01-02 ENCOUNTER — Encounter: Payer: Self-pay | Admitting: Certified Nurse Midwife

## 2020-01-02 ENCOUNTER — Ambulatory Visit: Payer: Federal, State, Local not specified - PPO | Admitting: Certified Nurse Midwife

## 2020-01-02 VITALS — BP 133/84 | HR 107 | Ht 61.0 in | Wt 208.4 lb

## 2020-01-02 DIAGNOSIS — N951 Menopausal and female climacteric states: Secondary | ICD-10-CM | POA: Diagnosis not present

## 2020-01-02 NOTE — Progress Notes (Signed)
OB/GYN CONFERENCE NOTE:  Subjective:       Sierra Moore is a 49 y.o. G65P0 female who presents for a conference appointment. Current complaints include: Menopause questions and concerns regarding use of birth control, timing of cessations.    Gynecologic History No LMP recorded (lmp unknown). (Menstrual status: Oral contraceptives). Contraception: OCP (estrogen/progesterone)  Obstetric History OB History  Gravida Para Term Preterm AB Living  2         2  SAB TAB Ectopic Multiple Live Births          2    # Outcome Date GA Lbr Len/2nd Weight Sex Delivery Anes PTL Lv  2 Gravida 2000    F Vag-Spont   LIV  1 Gravida 1997    M Vag-Spont   LIV    Past Medical History:  Diagnosis Date  . Allergy   . Arthritis    POLYARTHRALGIA  . GERD (gastroesophageal reflux disease)   . Leg swelling   . Obesity   . PMDD (premenstrual dysphoric disorder)   . PMDD (premenstrual dysphoric disorder)   . Raynaud disease   . Vitamin D deficiency     Past Surgical History:  Procedure Laterality Date  . COLONOSCOPY WITH PROPOFOL N/A 04/27/2019   Procedure: COLONOSCOPY WITH BIOPSY;  Surgeon: Lucilla Lame, MD;  Location: [facility];  Service: Endoscopy;  Laterality: N/A;  . ESOPHAGOGASTRODUODENOSCOPY (EGD) WITH PROPOFOL N/A 04/27/2019   Procedure: ESOPHAGOGASTRODUODENOSCOPY (EGD) WITH PROPOFOL;  Surgeon: Lucilla Lame, MD;  Location: [facility];  Service: Endoscopy;  Laterality: N/A;  . POLYPECTOMY N/A 04/27/2019   Procedure: POLYPECTOMY;  Surgeon: Lucilla Lame, MD;  Location: [facility];  Service: Endoscopy;  Laterality: N/A;  . ROBOTIC ASSISTED LAPAROSCOPIC CHOLECYSTECTOMY N/A 01/10/2019   Procedure: ROBOTIC ASSISTED LAPAROSCOPIC CHOLECYSTECTOMY;  Surgeon: Jules Husbands, MD;  Location: [facility];  Service: General;  Laterality: N/A;  . TONSILLECTOMY      Current Outpatient Medications on File Prior to Visit  Medication Sig Dispense Refill  . diclofenac Sodium  (VOLTAREN) 1 % GEL Apply topically.    . fluticasone (FLONASE) 50 MCG/ACT nasal spray 2 sprays in each nostril daily x1 week then 1-2 sprays in each nostril daily. (use smallest dose possible for symptom control after week 1).    . LESSINA-28 0.1-20 MG-MCG tablet Take 1 tablet by mouth daily. 84 tablet 2  . loratadine (CLARITIN) 10 MG tablet Take by mouth.    . meloxicam (MOBIC) 15 MG tablet Take 15 mg by mouth daily.     Marland Kitchen doxycycline (VIBRAMYCIN) 100 MG capsule Take 1 capsule (100 mg total) by mouth 2 (two) times daily for 7 days. (Patient not taking: Reported on 01/02/2020) 14 capsule 0   No current facility-administered medications on file prior to visit.    Allergies  Allergen Reactions  . Penicillins Hives    Did it involve swelling of the face/tongue/throat, SOB, or low BP? No Did it involve sudden or severe rash/hives, skin peeling, or any reaction on the inside of your mouth or nose? No Did you need to seek medical attention at a hospital or doctor's office? No When did it last happen?within the last 5 years If all above answers are "NO", may proceed with cephalosporin use.     Social History   Socioeconomic History  . Marital status: Married    Spouse name: Not on file  . Number of children: Not on file  . Years of education: Not on file  .  Highest education level: Not on file  Occupational History  . Not on file  Tobacco Use  . Smoking status: Never Smoker  . Smokeless tobacco: Never Used  Vaping Use  . Vaping Use: Never used  Substance and Sexual Activity  . Alcohol use: No  . Drug use: No  . Sexual activity: Yes    Birth control/protection: Pill  Other Topics Concern  . Not on file  Social History Narrative   Married   Works as Gaffer   Son, Optician, dispensing in [employer]   Daughter 89, senior in [place].    Exercise: Dance   Diet: Has tried a low carb diet. Working on [employer].    Social Determinants of Health   Financial Resource  Strain:   . Difficulty of Paying Living Expenses:   Food Insecurity:   . Worried About Charity fundraiser in the Last Year:   . Arboriculturist in the Last Year:   Transportation Needs:   . Film/video editor (Medical):   Marland Kitchen Lack of Transportation (Non-Medical):   Physical Activity:   . Days of Exercise per Week:   . Minutes of Exercise per Session:   Stress:   . Feeling of Stress :   Social Connections:   . Frequency of Communication with Friends and Family:   . Frequency of Social Gatherings with Friends and Family:   . Attends Religious Services:   . Active Member of Clubs or Organizations:   . Attends Archivist Meetings:   Marland Kitchen Marital Status:   Intimate Partner Violence:   . Fear of Current or Ex-Partner:   . Emotionally Abused:   Marland Kitchen Physically Abused:   . Sexually Abused:     Family History  Problem Relation Age of Onset  . Arthritis Mother   . Raynaud syndrome Mother   . Arthritis Father   . Breast cancer Neg Hx   . Bladder Cancer Neg Hx   . Kidney cancer Neg Hx     The following portions of the patient's history were reviewed and updated as appropriate: allergies, current medications, past family history, past medical history, past social history, past surgical history and problem list.  Review of Systems Constitutional: negative Eyes: negative Ears, nose, mouth, throat, and face: negative Respiratory: negative Cardiovascular: negative Gastrointestinal: negative Genitourinary:negative Integument/breast: negative Hematologic/lymphatic: negative Musculoskeletal:negative Neurological: negative Behavioral/Psych: negative Endocrine: negative Allergic/Immunologic: negative   Objective:   BP 133/84   Pulse (!) 107   Ht 5\' 1"  (1.549 m)   Wt 208 lb 7 oz (94.5 kg)   LMP  (LMP Unknown)   BMI 39.38 kg/m    Assessment/Plan:   Patient Active Problem List   Diagnosis Date Noted  . Primary osteoarthritis of both knees 05/09/2019  . Special  screening for malignant neoplasms, colon   . Polyp of descending colon   . Other chest pain   . Onychomycosis of toenail 12/27/2018  . Polyarthralgia 12/27/2018  . HLD (hyperlipidemia) 12/21/2018  . Right foot pain 12/07/2018  . Eczema 03/04/2017  . Raynaud's phenomenon without gangrene 03/04/2017  . Dysuria 09/25/2016  . Mixed stress and urge urinary incontinence 09/25/2016  . Mild obesity 03/04/2016  . Seasonal allergies 02/10/2016  . Encounter for routine adult physical exam with abnormal findings 02/10/2016  . Heat intolerance 02/10/2016  . Hematuria 02/10/2016  . Vitamin D deficiency 10/25/2015  . DM (diabetes mellitus) ([place]) 10/25/2015  . IBS (irritable bowel syndrome) 10/24/2015  Pt is currently on OCps and has been for years for management of premenstrual symptoms, mood changes, bleeding , and cramps. She has been happy with the pill . She presents today for discussion of cessation of pill in relationship to menopause. She state she is worried about stopping the pill because she does not want her horrible periods to come back. Discussed menopause at length, dx , and treatment of symptoms. Discussed average age of menopause, explained the only way to dx is absence of menstruation x 1 year. Discussed average age of menopause and that it is reasonable for her to stop the pill if she would like and she if she is in menopause or it is reasonable to continue the pill for another yr or 2. Information given to pt on Menopause , self help treatment options, HRT, SERM use, SSRI use , herbal supplements, and lifestyle changes to help manage symptoms. She verbalizes understanding and is going to continue pill use for a little while longer.    Time:15  Return to Clinic: PRN   Philip Aspen, CNM [facility]

## 2020-01-02 NOTE — Patient Instructions (Signed)
Menopause Menopause is the normal time of life when menstrual periods stop completely. It is usually confirmed by 12 months without a menstrual period. The transition to menopause (perimenopause) most often happens between the ages of 45 and 55. During perimenopause, hormone levels change in your body, which can cause symptoms and affect your health. Menopause may increase your risk for:  Loss of bone (osteoporosis), which causes bone breaks (fractures).  Depression.  Hardening and narrowing of the arteries (atherosclerosis), which can cause heart attacks and strokes. What are the causes? This condition is usually caused by a natural change in hormone levels that happens as you get older. The condition may also be caused by surgery to remove both ovaries (bilateral oophorectomy). What increases the risk? This condition is more likely to start at an earlier age if you have certain medical conditions or treatments, including:  A tumor of the pituitary gland in the brain.  A disease that affects the ovaries and hormone production.  Radiation treatment for cancer.  Certain cancer treatments, such as chemotherapy or hormone (anti-estrogen) therapy.  Heavy smoking and excessive alcohol use.  Family history of early menopause. This condition is also more likely to develop earlier in women who are very thin. What are the signs or symptoms? Symptoms of this condition include:  Hot flashes.  Irregular menstrual periods.  Night sweats.  Changes in feelings about sex. This could be a decrease in sex drive or an increased comfort around your sexuality.  Vaginal dryness and thinning of the vaginal walls. This may cause painful intercourse.  Dryness of the skin and development of wrinkles.  Headaches.  Problems sleeping (insomnia).  Mood swings or irritability.  Memory problems.  Weight gain.  Hair growth on the face and chest.  Bladder infections or problems with urinating. How  is this diagnosed? This condition is diagnosed based on your medical history, a physical exam, your age, your menstrual history, and your symptoms. Hormone tests may also be done. How is this treated? In some cases, no treatment is needed. You and your health care provider should make a decision together about whether treatment is necessary. Treatment will be based on your individual condition and preferences. Treatment for this condition focuses on managing symptoms. Treatment may include:  Menopausal hormone therapy (MHT).  Medicines to treat specific symptoms or complications.  Acupuncture.  Vitamin or herbal supplements. Before starting treatment, make sure to let your health care provider know if you have a personal or family history of:  Heart disease.  Breast cancer.  Blood clots.  Diabetes.  Osteoporosis. Follow these instructions at home: Lifestyle  Do not use any products that contain nicotine or tobacco, such as cigarettes and e-cigarettes. If you need help quitting, ask your health care provider.  Get at least 30 minutes of physical activity on 5 or more days each week.  Avoid alcoholic and caffeinated beverages, as well as spicy foods. This may help prevent hot flashes.  Get 7-8 hours of sleep each night.  If you have hot flashes, try: ? Dressing in layers. ? Avoiding things that may trigger hot flashes, such as spicy food, warm places, or stress. ? Taking slow, deep breaths when a hot flash starts. ? Keeping a fan in your home and office.  Find ways to manage stress, such as deep breathing, meditation, or journaling.  Consider going to group therapy with other women who are having menopause symptoms. Ask your health care provider about recommended group therapy meetings. Eating and   drinking  Eat a healthy, balanced diet that contains whole grains, lean protein, low-fat dairy, and plenty of fruits and vegetables.  Your health care provider may recommend  adding more soy to your diet. Foods that contain soy include tofu, tempeh, and soy milk.  Eat plenty of foods that contain calcium and vitamin D for bone health. Items that are rich in calcium include low-fat milk, yogurt, beans, almonds, sardines, broccoli, and kale. Medicines  Take over-the-counter and prescription medicines only as told by your health care provider.  Talk with your health care provider before starting any herbal supplements. If prescribed, take vitamins and supplements as told by your health care provider. These may include: ? Calcium. Women age 51 and older should get 1,200 mg (milligrams) of calcium every day. ? Vitamin D. Women need 600-800 International Units of vitamin D each day. ? Vitamins B12 and B6. Aim for 50 micrograms of B12 and 1.5 mg of B6 each day. General instructions  Keep track of your menstrual periods, including: ? When they occur. ? How heavy they are and how long they last. ? How much time passes between periods.  Keep track of your symptoms, noting when they start, how often you have them, and how long they last.  Use vaginal lubricants or moisturizers to help with vaginal dryness and improve comfort during sex.  Keep all follow-up visits as told by your health care provider. This is important. This includes any group therapy or counseling. Contact a health care provider if:  You are still having menstrual periods after age 55.  You have pain during sex.  You have not had a period for 12 months and you develop vaginal bleeding. Get help right away if:  You have: ? Severe depression. ? Excessive vaginal bleeding. ? Pain when you urinate. ? A fast or irregular heart beat (palpitations). ? Severe headaches. ? Abdomen (abdominal) pain or severe indigestion.  You fell and you think you have a broken bone.  You develop leg or chest pain.  You develop vision problems.  You feel a lump in your breast. Summary  Menopause is the normal  time of life when menstrual periods stop completely. It is usually confirmed by 12 months without a menstrual period.  The transition to menopause (perimenopause) most often happens between the ages of 45 and 55.  Symptoms can be managed through medicines, lifestyle changes, and complementary therapies such as acupuncture.  Eat a balanced diet that is rich in nutrients to promote bone health and heart health and to manage symptoms during menopause. This information is not intended to replace advice given to you by your health care provider. Make sure you discuss any questions you have with your health care provider. Document Revised: 06/04/2017 Document Reviewed: 07/25/2016 Elsevier Patient Education  2020 Elsevier Inc.  

## 2020-01-18 DIAGNOSIS — F411 Generalized anxiety disorder: Secondary | ICD-10-CM | POA: Diagnosis not present

## 2020-01-29 DIAGNOSIS — M170 Bilateral primary osteoarthritis of knee: Secondary | ICD-10-CM | POA: Diagnosis not present

## 2020-02-01 DIAGNOSIS — F418 Other specified anxiety disorders: Secondary | ICD-10-CM | POA: Diagnosis not present

## 2020-02-19 DIAGNOSIS — F418 Other specified anxiety disorders: Secondary | ICD-10-CM | POA: Diagnosis not present

## 2020-03-08 DIAGNOSIS — F418 Other specified anxiety disorders: Secondary | ICD-10-CM | POA: Diagnosis not present

## 2020-03-21 DIAGNOSIS — F418 Other specified anxiety disorders: Secondary | ICD-10-CM | POA: Diagnosis not present

## 2020-04-08 DIAGNOSIS — F418 Other specified anxiety disorders: Secondary | ICD-10-CM | POA: Diagnosis not present

## 2020-04-22 DIAGNOSIS — F418 Other specified anxiety disorders: Secondary | ICD-10-CM | POA: Diagnosis not present

## 2020-05-08 DIAGNOSIS — E669 Obesity, unspecified: Secondary | ICD-10-CM | POA: Diagnosis not present

## 2020-05-08 DIAGNOSIS — L608 Other nail disorders: Secondary | ICD-10-CM | POA: Diagnosis not present

## 2020-05-08 DIAGNOSIS — Z791 Long term (current) use of non-steroidal anti-inflammatories (NSAID): Secondary | ICD-10-CM | POA: Diagnosis not present

## 2020-05-08 DIAGNOSIS — M170 Bilateral primary osteoarthritis of knee: Secondary | ICD-10-CM | POA: Diagnosis not present

## 2020-05-09 DIAGNOSIS — F418 Other specified anxiety disorders: Secondary | ICD-10-CM | POA: Diagnosis not present

## 2020-05-13 DIAGNOSIS — L918 Other hypertrophic disorders of the skin: Secondary | ICD-10-CM | POA: Diagnosis not present

## 2020-05-13 DIAGNOSIS — B351 Tinea unguium: Secondary | ICD-10-CM | POA: Diagnosis not present

## 2020-05-13 DIAGNOSIS — Z85828 Personal history of other malignant neoplasm of skin: Secondary | ICD-10-CM | POA: Diagnosis not present

## 2020-05-13 DIAGNOSIS — L578 Other skin changes due to chronic exposure to nonionizing radiation: Secondary | ICD-10-CM | POA: Diagnosis not present

## 2020-05-21 DIAGNOSIS — F418 Other specified anxiety disorders: Secondary | ICD-10-CM | POA: Diagnosis not present

## 2020-06-07 DIAGNOSIS — F418 Other specified anxiety disorders: Secondary | ICD-10-CM | POA: Diagnosis not present

## 2020-06-10 DIAGNOSIS — M5416 Radiculopathy, lumbar region: Secondary | ICD-10-CM | POA: Diagnosis not present

## 2020-06-10 DIAGNOSIS — M5136 Other intervertebral disc degeneration, lumbar region: Secondary | ICD-10-CM | POA: Diagnosis not present

## 2020-06-10 DIAGNOSIS — M9903 Segmental and somatic dysfunction of lumbar region: Secondary | ICD-10-CM | POA: Diagnosis not present

## 2020-06-10 DIAGNOSIS — M62830 Muscle spasm of back: Secondary | ICD-10-CM | POA: Diagnosis not present

## 2020-06-11 DIAGNOSIS — M5416 Radiculopathy, lumbar region: Secondary | ICD-10-CM | POA: Diagnosis not present

## 2020-06-11 DIAGNOSIS — M9903 Segmental and somatic dysfunction of lumbar region: Secondary | ICD-10-CM | POA: Diagnosis not present

## 2020-06-11 DIAGNOSIS — M62830 Muscle spasm of back: Secondary | ICD-10-CM | POA: Diagnosis not present

## 2020-06-11 DIAGNOSIS — M5136 Other intervertebral disc degeneration, lumbar region: Secondary | ICD-10-CM | POA: Diagnosis not present

## 2020-06-12 DIAGNOSIS — M9903 Segmental and somatic dysfunction of lumbar region: Secondary | ICD-10-CM | POA: Diagnosis not present

## 2020-06-12 DIAGNOSIS — M5416 Radiculopathy, lumbar region: Secondary | ICD-10-CM | POA: Diagnosis not present

## 2020-06-12 DIAGNOSIS — M5136 Other intervertebral disc degeneration, lumbar region: Secondary | ICD-10-CM | POA: Diagnosis not present

## 2020-06-12 DIAGNOSIS — M62830 Muscle spasm of back: Secondary | ICD-10-CM | POA: Diagnosis not present

## 2020-06-14 DIAGNOSIS — M9903 Segmental and somatic dysfunction of lumbar region: Secondary | ICD-10-CM | POA: Diagnosis not present

## 2020-06-14 DIAGNOSIS — M62830 Muscle spasm of back: Secondary | ICD-10-CM | POA: Diagnosis not present

## 2020-06-14 DIAGNOSIS — M5416 Radiculopathy, lumbar region: Secondary | ICD-10-CM | POA: Diagnosis not present

## 2020-06-14 DIAGNOSIS — M5136 Other intervertebral disc degeneration, lumbar region: Secondary | ICD-10-CM | POA: Diagnosis not present

## 2020-06-17 DIAGNOSIS — M5136 Other intervertebral disc degeneration, lumbar region: Secondary | ICD-10-CM | POA: Diagnosis not present

## 2020-06-17 DIAGNOSIS — M9903 Segmental and somatic dysfunction of lumbar region: Secondary | ICD-10-CM | POA: Diagnosis not present

## 2020-06-17 DIAGNOSIS — M5416 Radiculopathy, lumbar region: Secondary | ICD-10-CM | POA: Diagnosis not present

## 2020-06-17 DIAGNOSIS — M62830 Muscle spasm of back: Secondary | ICD-10-CM | POA: Diagnosis not present

## 2020-06-19 ENCOUNTER — Encounter: Payer: Federal, State, Local not specified - PPO | Admitting: Certified Nurse Midwife

## 2020-06-19 DIAGNOSIS — M5136 Other intervertebral disc degeneration, lumbar region: Secondary | ICD-10-CM | POA: Diagnosis not present

## 2020-06-19 DIAGNOSIS — M5416 Radiculopathy, lumbar region: Secondary | ICD-10-CM | POA: Diagnosis not present

## 2020-06-19 DIAGNOSIS — M9903 Segmental and somatic dysfunction of lumbar region: Secondary | ICD-10-CM | POA: Diagnosis not present

## 2020-06-19 DIAGNOSIS — M62830 Muscle spasm of back: Secondary | ICD-10-CM | POA: Diagnosis not present

## 2020-06-20 DIAGNOSIS — M5416 Radiculopathy, lumbar region: Secondary | ICD-10-CM | POA: Diagnosis not present

## 2020-06-20 DIAGNOSIS — M62830 Muscle spasm of back: Secondary | ICD-10-CM | POA: Diagnosis not present

## 2020-06-20 DIAGNOSIS — F418 Other specified anxiety disorders: Secondary | ICD-10-CM | POA: Diagnosis not present

## 2020-06-20 DIAGNOSIS — M9903 Segmental and somatic dysfunction of lumbar region: Secondary | ICD-10-CM | POA: Diagnosis not present

## 2020-06-20 DIAGNOSIS — M5136 Other intervertebral disc degeneration, lumbar region: Secondary | ICD-10-CM | POA: Diagnosis not present

## 2020-06-24 DIAGNOSIS — M9903 Segmental and somatic dysfunction of lumbar region: Secondary | ICD-10-CM | POA: Diagnosis not present

## 2020-06-24 DIAGNOSIS — M5416 Radiculopathy, lumbar region: Secondary | ICD-10-CM | POA: Diagnosis not present

## 2020-06-24 DIAGNOSIS — M5136 Other intervertebral disc degeneration, lumbar region: Secondary | ICD-10-CM | POA: Diagnosis not present

## 2020-06-24 DIAGNOSIS — M62830 Muscle spasm of back: Secondary | ICD-10-CM | POA: Diagnosis not present

## 2020-07-01 ENCOUNTER — Other Ambulatory Visit: Payer: Self-pay | Admitting: Certified Nurse Midwife

## 2020-07-01 ENCOUNTER — Telehealth: Payer: Self-pay

## 2020-07-01 DIAGNOSIS — M62830 Muscle spasm of back: Secondary | ICD-10-CM | POA: Diagnosis not present

## 2020-07-01 DIAGNOSIS — M9903 Segmental and somatic dysfunction of lumbar region: Secondary | ICD-10-CM | POA: Diagnosis not present

## 2020-07-01 DIAGNOSIS — M5416 Radiculopathy, lumbar region: Secondary | ICD-10-CM | POA: Diagnosis not present

## 2020-07-01 DIAGNOSIS — M5136 Other intervertebral disc degeneration, lumbar region: Secondary | ICD-10-CM | POA: Diagnosis not present

## 2020-07-01 MED ORDER — LESSINA 0.1-20 MG-MCG PO TABS: 1.0000 | ORAL_TABLET | Freq: Every day | ORAL | 0 refills | Status: DC

## 2020-07-01 NOTE — Telephone Encounter (Signed)
Patient called in requesting a refill on her birth control, patient is scheduled for Physical at the end of January.   Could you please advise?

## 2020-07-01 NOTE — Telephone Encounter (Signed)
Pt is aware that her medication has been refilled until her appointment in January 2022.

## 2020-07-03 DIAGNOSIS — M5136 Other intervertebral disc degeneration, lumbar region: Secondary | ICD-10-CM | POA: Diagnosis not present

## 2020-07-03 DIAGNOSIS — M9903 Segmental and somatic dysfunction of lumbar region: Secondary | ICD-10-CM | POA: Diagnosis not present

## 2020-07-03 DIAGNOSIS — M5416 Radiculopathy, lumbar region: Secondary | ICD-10-CM | POA: Diagnosis not present

## 2020-07-03 DIAGNOSIS — M62830 Muscle spasm of back: Secondary | ICD-10-CM | POA: Diagnosis not present

## 2020-07-04 DIAGNOSIS — M5416 Radiculopathy, lumbar region: Secondary | ICD-10-CM | POA: Diagnosis not present

## 2020-07-04 DIAGNOSIS — M5136 Other intervertebral disc degeneration, lumbar region: Secondary | ICD-10-CM | POA: Diagnosis not present

## 2020-07-04 DIAGNOSIS — M9903 Segmental and somatic dysfunction of lumbar region: Secondary | ICD-10-CM | POA: Diagnosis not present

## 2020-07-04 DIAGNOSIS — M62830 Muscle spasm of back: Secondary | ICD-10-CM | POA: Diagnosis not present

## 2020-07-09 DIAGNOSIS — M62830 Muscle spasm of back: Secondary | ICD-10-CM | POA: Diagnosis not present

## 2020-07-09 DIAGNOSIS — M5416 Radiculopathy, lumbar region: Secondary | ICD-10-CM | POA: Diagnosis not present

## 2020-07-09 DIAGNOSIS — M9903 Segmental and somatic dysfunction of lumbar region: Secondary | ICD-10-CM | POA: Diagnosis not present

## 2020-07-09 DIAGNOSIS — M5136 Other intervertebral disc degeneration, lumbar region: Secondary | ICD-10-CM | POA: Diagnosis not present

## 2020-07-10 DIAGNOSIS — M5136 Other intervertebral disc degeneration, lumbar region: Secondary | ICD-10-CM | POA: Diagnosis not present

## 2020-07-10 DIAGNOSIS — M9903 Segmental and somatic dysfunction of lumbar region: Secondary | ICD-10-CM | POA: Diagnosis not present

## 2020-07-10 DIAGNOSIS — M5416 Radiculopathy, lumbar region: Secondary | ICD-10-CM | POA: Diagnosis not present

## 2020-07-10 DIAGNOSIS — M62830 Muscle spasm of back: Secondary | ICD-10-CM | POA: Diagnosis not present

## 2020-07-15 DIAGNOSIS — M5416 Radiculopathy, lumbar region: Secondary | ICD-10-CM | POA: Diagnosis not present

## 2020-07-15 DIAGNOSIS — M5136 Other intervertebral disc degeneration, lumbar region: Secondary | ICD-10-CM | POA: Diagnosis not present

## 2020-07-15 DIAGNOSIS — M9903 Segmental and somatic dysfunction of lumbar region: Secondary | ICD-10-CM | POA: Diagnosis not present

## 2020-07-15 DIAGNOSIS — M62830 Muscle spasm of back: Secondary | ICD-10-CM | POA: Diagnosis not present

## 2020-07-16 DIAGNOSIS — M9903 Segmental and somatic dysfunction of lumbar region: Secondary | ICD-10-CM | POA: Diagnosis not present

## 2020-07-16 DIAGNOSIS — M62830 Muscle spasm of back: Secondary | ICD-10-CM | POA: Diagnosis not present

## 2020-07-16 DIAGNOSIS — M5416 Radiculopathy, lumbar region: Secondary | ICD-10-CM | POA: Diagnosis not present

## 2020-07-16 DIAGNOSIS — M5136 Other intervertebral disc degeneration, lumbar region: Secondary | ICD-10-CM | POA: Diagnosis not present

## 2020-07-18 DIAGNOSIS — M5416 Radiculopathy, lumbar region: Secondary | ICD-10-CM | POA: Diagnosis not present

## 2020-07-18 DIAGNOSIS — M9903 Segmental and somatic dysfunction of lumbar region: Secondary | ICD-10-CM | POA: Diagnosis not present

## 2020-07-18 DIAGNOSIS — M5136 Other intervertebral disc degeneration, lumbar region: Secondary | ICD-10-CM | POA: Diagnosis not present

## 2020-07-18 DIAGNOSIS — M62830 Muscle spasm of back: Secondary | ICD-10-CM | POA: Diagnosis not present

## 2020-07-23 DIAGNOSIS — M9903 Segmental and somatic dysfunction of lumbar region: Secondary | ICD-10-CM | POA: Diagnosis not present

## 2020-07-23 DIAGNOSIS — M62830 Muscle spasm of back: Secondary | ICD-10-CM | POA: Diagnosis not present

## 2020-07-23 DIAGNOSIS — M5416 Radiculopathy, lumbar region: Secondary | ICD-10-CM | POA: Diagnosis not present

## 2020-07-23 DIAGNOSIS — M5136 Other intervertebral disc degeneration, lumbar region: Secondary | ICD-10-CM | POA: Diagnosis not present

## 2020-07-25 DIAGNOSIS — M62830 Muscle spasm of back: Secondary | ICD-10-CM | POA: Diagnosis not present

## 2020-07-25 DIAGNOSIS — M5136 Other intervertebral disc degeneration, lumbar region: Secondary | ICD-10-CM | POA: Diagnosis not present

## 2020-07-25 DIAGNOSIS — M9903 Segmental and somatic dysfunction of lumbar region: Secondary | ICD-10-CM | POA: Diagnosis not present

## 2020-07-25 DIAGNOSIS — M5416 Radiculopathy, lumbar region: Secondary | ICD-10-CM | POA: Diagnosis not present

## 2020-07-26 ENCOUNTER — Other Ambulatory Visit: Payer: Self-pay | Admitting: Certified Nurse Midwife

## 2020-07-30 ENCOUNTER — Ambulatory Visit (INDEPENDENT_AMBULATORY_CARE_PROVIDER_SITE_OTHER): Payer: Federal, State, Local not specified - PPO | Admitting: Certified Nurse Midwife

## 2020-07-30 ENCOUNTER — Encounter: Payer: Self-pay | Admitting: Certified Nurse Midwife

## 2020-07-30 ENCOUNTER — Other Ambulatory Visit: Payer: Self-pay

## 2020-07-30 VITALS — Ht 61.0 in | Wt 204.2 lb

## 2020-07-30 DIAGNOSIS — Z01419 Encounter for gynecological examination (general) (routine) without abnormal findings: Secondary | ICD-10-CM | POA: Diagnosis not present

## 2020-07-30 DIAGNOSIS — Z1231 Encounter for screening mammogram for malignant neoplasm of breast: Secondary | ICD-10-CM | POA: Diagnosis not present

## 2020-07-30 MED ORDER — LESSINA 0.1-20 MG-MCG PO TABS: 1.0000 | ORAL_TABLET | Freq: Every day | ORAL | 3 refills | Status: DC

## 2020-07-30 NOTE — Patient Instructions (Signed)
Preventive Care 52-52 Years Old, Female Preventive care refers to lifestyle choices and visits with your health care provider that can promote health and wellness. This includes:  A yearly physical exam. This is also called an annual wellness visit.  Regular dental and eye exams.  Immunizations.  Screening for certain conditions.  Healthy lifestyle choices, such as: ? Eating a healthy diet. ? Getting regular exercise. ? Not using drugs or products that contain nicotine and tobacco. ? Limiting alcohol use. What can I expect for my preventive care visit? Physical exam Your health care provider will check your:  Height and weight. These may be used to calculate your BMI (body mass index). BMI is a measurement that tells if you are at a healthy weight.  Heart rate and blood pressure.  Body temperature.  Skin for abnormal spots. Counseling Your health care provider may ask you questions about your:  Past medical problems.  Family's medical history.  Alcohol, tobacco, and drug use.  Emotional well-being.  Home life and relationship well-being.  Sexual activity.  Diet, exercise, and sleep habits.  Work and work Statistician.  Access to firearms.  Method of birth control.  Menstrual cycle.  Pregnancy history. What immunizations do I need? Vaccines are usually given at various ages, according to a schedule. Your health care provider will recommend vaccines for you based on your age, medical history, and lifestyle or other factors, such as travel or where you work.   What tests do I need? Blood tests  Lipid and cholesterol levels. These may be checked every 5 years, or more often if you are over 3 years old.  Hepatitis C test.  Hepatitis B test. Screening  Lung cancer screening. You may have this screening every year starting at age 52 if you have a 30-pack-year history of smoking and currently smoke or have quit within the past 15 years.  Colorectal cancer  screening. ? All adults should have this screening starting at age 52 and continuing until age 17. ? Your health care provider may recommend screening at age 49 if you are at increased risk. ? You will have tests every 1-10 years, depending on your results and the type of screening test.  Diabetes screening. ? This is done by checking your blood sugar (glucose) after you have not eaten for a while (fasting). ? You may have this done every 1-3 years.  Mammogram. ? This may be done every 1-2 years. ? Talk with your health care provider about when you should start having regular mammograms. This may depend on whether you have a family history of breast cancer.  BRCA-related cancer screening. This may be done if you have a family history of breast, ovarian, tubal, or peritoneal cancers.  Pelvic exam and Pap test. ? This may be done every 3 years starting at age 52. ? Starting at age 11, this may be done every 5 years if you have a Pap test in combination with an HPV test. Other tests  STD (sexually transmitted disease) testing, if you are at risk.  Bone density scan. This is done to screen for osteoporosis. You may have this scan if you are at high risk for osteoporosis. Talk with your health care provider about your test results, treatment options, and if necessary, the need for more tests. Follow these instructions at home: Eating and drinking  Eat a diet that includes fresh fruits and vegetables, whole grains, lean protein, and low-fat dairy products.  Take vitamin and mineral supplements  as recommended by your health care provider.  Do not drink alcohol if: ? Your health care provider tells you not to drink. ? You are pregnant, may be pregnant, or are planning to become pregnant.  If you drink alcohol: ? Limit how much you have to 0-1 drink a day. ? Be aware of how much alcohol is in your drink. In the [place] one drink equals one 12 oz bottle of beer (355 mL), one 5 oz glass of  wine (148 mL), or one 1 oz glass of hard liquor (44 mL).   Lifestyle  Take daily care of your teeth and gums. Brush your teeth every morning and night with fluoride toothpaste. Floss one time each day.  Stay active. Exercise for at least 30 minutes 5 or more days each week.  Do not use any products that contain nicotine or tobacco, such as cigarettes, e-cigarettes, and chewing tobacco. If you need help quitting, ask your health care provider.  Do not use drugs.  If you are sexually active, practice safe sex. Use a condom or other form of protection to prevent STIs (sexually transmitted infections).  If you do not wish to become pregnant, use a form of birth control. If you plan to become pregnant, see your health care provider for a prepregnancy visit.  If told by your health care provider, take low-dose aspirin daily starting at age 52.  Find healthy ways to cope with stress, such as: ? Meditation, yoga, or listening to music. ? Journaling. ? Talking to a trusted person. ? Spending time with friends and family. Safety  Always wear your seat belt while driving or riding in a vehicle.  Do not drive: ? If you have been drinking alcohol. Do not ride with someone who has been drinking. ? When you are tired or distracted. ? While texting.  Wear a helmet and other protective equipment during sports activities.  If you have firearms in your house, make sure you follow all gun safety procedures. What's next?  Visit your health care provider once a year for an annual wellness visit.  Ask your health care provider how often you should have your eyes and teeth checked.  Stay up to date on all vaccines. This information is not intended to replace advice given to you by your health care provider. Make sure you discuss any questions you have with your health care provider. Document Revised: 03/26/2020 Document Reviewed: 03/03/2018 Elsevier Patient Education  2021 Elsevier Inc.  

## 2020-07-30 NOTE — Progress Notes (Signed)
GYNECOLOGY ANNUAL PREVENTATIVE CARE ENCOUNTER NOTE  History:     Sierra Moore is a 50 y.o. G49P2002 female here for a routine annual gynecologic exam.  Current complaints: none.   Denies abnormal vaginal bleeding, discharge, pelvic pain, problems with intercourse or other gynecologic concerns.     Social Relationship: married  Living:with spouse and daughter Work: [employer] Exercise: few time a month for 20 min .  Smoke/Alcohol/drug GEX:[id number]   Gynecologic History No LMP recorded (lmp unknown). (Menstrual status: Oral contraceptives). Contraception: OCP (estrogen/progesterone) Last Pap: 03/24/19 Results were: normal with negative HPV Last mammogram: 06/2019. Results were: normal   Upstream - 07/30/20 1329      Pregnancy Intention Screening   Does the patient want to become pregnant in the next year? No    Does the patient's partner want to become pregnant in the next year? No    Would the patient like to discuss contraceptive options today? No      Contraception Wrap Up   Current Method Oral Contraceptive    End Method Oral Contraceptive    Contraception Counseling Provided No          The pregnancy intention screening data noted above was reviewed. Potential methods of contraception were discussed. The patient elected to proceed with Oral Contraceptive.    Obstetric History OB History  Gravida Para Term Preterm AB Living  2 2 2     2   SAB IAB Ectopic Multiple Live Births          2    # Outcome Date GA Lbr Len/2nd Weight Sex Delivery Anes PTL Lv  2 Term 10/10/98 [redacted]w[redacted]d  8 lb (3.629 kg) F Vag-Spont EPI N LIV  1 Term 04/08/96 [redacted]w[redacted]d  6 lb 12 oz (3.062 kg) M Vag-Spont Other N LIV    Past Medical History:  Diagnosis Date  . Allergy   . Arthritis    POLYARTHRALGIA  . GERD (gastroesophageal reflux disease)   . Leg swelling   . Obesity   . PMDD (premenstrual dysphoric disorder)   . PMDD (premenstrual dysphoric disorder)   . Raynaud disease    . Vitamin D deficiency     Past Surgical History:  Procedure Laterality Date  . COLONOSCOPY WITH PROPOFOL N/A 04/27/2019   Procedure: COLONOSCOPY WITH BIOPSY;  Surgeon: Lucilla Lame, MD;  Location: [place];  Service: Endoscopy;  Laterality: N/A;  . ESOPHAGOGASTRODUODENOSCOPY (EGD) WITH PROPOFOL N/A 04/27/2019   Procedure: ESOPHAGOGASTRODUODENOSCOPY (EGD) WITH PROPOFOL;  Surgeon: Lucilla Lame, MD;  Location: [place];  Service: Endoscopy;  Laterality: N/A;  . POLYPECTOMY N/A 04/27/2019   Procedure: POLYPECTOMY;  Surgeon: Lucilla Lame, MD;  Location: [place];  Service: Endoscopy;  Laterality: N/A;  . ROBOTIC ASSISTED LAPAROSCOPIC CHOLECYSTECTOMY N/A 01/10/2019   Procedure: ROBOTIC ASSISTED LAPAROSCOPIC CHOLECYSTECTOMY;  Surgeon: Jules Husbands, MD;  Location: [facility];  Service: General;  Laterality: N/A;  . TONSILLECTOMY      Current Outpatient Medications on File Prior to Visit  Medication Sig Dispense Refill  . diclofenac Sodium (VOLTAREN) 1 % GEL Apply topically.    . fluticasone (FLONASE) 50 MCG/ACT nasal spray 2 sprays in each nostril daily x1 week then 1-2 sprays in each nostril daily. (use smallest dose possible for symptom control after week 1).    . LESSINA-28 0.1-20 MG-MCG tablet TAKE 1 TABLET BY MOUTH DAILY 84 tablet 0  . meloxicam (MOBIC) 15 MG tablet Take 15 mg by mouth daily.    Marland Kitchen  loratadine (CLARITIN) 10 MG tablet Take by mouth.     No current facility-administered medications on file prior to visit.    Allergies  Allergen Reactions  . Penicillins Hives    Did it involve swelling of the face/tongue/throat, SOB, or low BP? No Did it involve sudden or severe rash/hives, skin peeling, or any reaction on the inside of your mouth or nose? No Did you need to seek medical attention at a hospital or doctor's office? No When did it last happen?within the last 5 years If all above answers are "NO", may proceed with cephalosporin use.      Social History:  reports that she has never smoked. She has never used smokeless tobacco. She reports that she does not drink alcohol and does not use drugs.  Family History  Problem Relation Age of Onset  . Arthritis Mother   . Raynaud syndrome Mother   . Arthritis Father   . Breast cancer Neg Hx   . Bladder Cancer Neg Hx   . Kidney cancer Neg Hx     The following portions of the patient's history were reviewed and updated as appropriate: allergies, current medications, past family history, past medical history, past social history, past surgical history and problem list.  Review of Systems Pertinent items noted in HPI and remainder of comprehensive ROS otherwise negative.  Physical Exam:  Ht 5\' 1"  (1.549 m)   Wt 204 lb 3 oz (92.6 kg)   LMP  (LMP Unknown)   BMI 38.58 kg/m  CONSTITUTIONAL: Well-developed, well-nourished, obese, female in no acute distress.  HENT:  Normocephalic, atraumatic, External right and left ear normal. Oropharynx is clear and moist EYES: Conjunctivae and EOM are normal. Pupils are equal, round, and reactive to light. No scleral icterus.  NECK: Normal range of motion, supple, no masses.  Normal thyroid.  SKIN: Skin is warm and dry. No rash noted. Not diaphoretic. No erythema. No pallor. MUSCULOSKELETAL: Normal range of motion. No tenderness.  No cyanosis, clubbing, or edema.  2+ distal pulses. NEUROLOGIC: Alert and oriented to person, place, and time. Normal reflexes, muscle tone coordination.  PSYCHIATRIC: Normal mood and affect. Normal behavior. Normal judgment and thought content. CARDIOVASCULAR: Normal heart rate noted, regular rhythm RESPIRATORY: Clear to auscultation bilaterally. Effort and breath sounds normal, no problems with respiration noted. BREASTS: Symmetric in size. No masses, tenderness, skin changes, nipple drainage, or lymphadenopathy bilaterally.  ABDOMEN: Soft, no distention noted.  No tenderness, rebound or guarding.  PELVIC: Normal  appearing external genitalia and urethral meatus; normal appearing vaginal mucosa and cervix.  No abnormal discharge noted.  Pap smear not indciated.  Normal uterine size, no other palpable masses, no uterine or adnexal tenderness.  Exam compromised due to body habitus.   Assessment and Plan:    1. Women's annual routine gynecological examination  Pap: n/a  Mammogram : ordered Labs: none Refills: ocp Referral: Routine preventative health maintenance measures emphasized. Please refer to After Visit Summary for other counseling recommendations.      Philip Aspen, CNM [facility]

## 2020-07-31 DIAGNOSIS — M9903 Segmental and somatic dysfunction of lumbar region: Secondary | ICD-10-CM | POA: Diagnosis not present

## 2020-07-31 DIAGNOSIS — M5136 Other intervertebral disc degeneration, lumbar region: Secondary | ICD-10-CM | POA: Diagnosis not present

## 2020-07-31 DIAGNOSIS — M5416 Radiculopathy, lumbar region: Secondary | ICD-10-CM | POA: Diagnosis not present

## 2020-07-31 DIAGNOSIS — M62830 Muscle spasm of back: Secondary | ICD-10-CM | POA: Diagnosis not present

## 2020-08-01 DIAGNOSIS — F418 Other specified anxiety disorders: Secondary | ICD-10-CM | POA: Diagnosis not present

## 2020-08-12 DIAGNOSIS — M5416 Radiculopathy, lumbar region: Secondary | ICD-10-CM | POA: Diagnosis not present

## 2020-08-12 DIAGNOSIS — M9903 Segmental and somatic dysfunction of lumbar region: Secondary | ICD-10-CM | POA: Diagnosis not present

## 2020-08-12 DIAGNOSIS — M62830 Muscle spasm of back: Secondary | ICD-10-CM | POA: Diagnosis not present

## 2020-08-12 DIAGNOSIS — M5136 Other intervertebral disc degeneration, lumbar region: Secondary | ICD-10-CM | POA: Diagnosis not present

## 2020-08-19 DIAGNOSIS — M62830 Muscle spasm of back: Secondary | ICD-10-CM | POA: Diagnosis not present

## 2020-08-19 DIAGNOSIS — M5136 Other intervertebral disc degeneration, lumbar region: Secondary | ICD-10-CM | POA: Diagnosis not present

## 2020-08-19 DIAGNOSIS — M9903 Segmental and somatic dysfunction of lumbar region: Secondary | ICD-10-CM | POA: Diagnosis not present

## 2020-08-19 DIAGNOSIS — M5416 Radiculopathy, lumbar region: Secondary | ICD-10-CM | POA: Diagnosis not present

## 2020-08-22 DIAGNOSIS — F418 Other specified anxiety disorders: Secondary | ICD-10-CM | POA: Diagnosis not present

## 2020-08-30 DIAGNOSIS — M5136 Other intervertebral disc degeneration, lumbar region: Secondary | ICD-10-CM | POA: Diagnosis not present

## 2020-08-30 DIAGNOSIS — M9903 Segmental and somatic dysfunction of lumbar region: Secondary | ICD-10-CM | POA: Diagnosis not present

## 2020-08-30 DIAGNOSIS — M5416 Radiculopathy, lumbar region: Secondary | ICD-10-CM | POA: Diagnosis not present

## 2020-08-30 DIAGNOSIS — M62830 Muscle spasm of back: Secondary | ICD-10-CM | POA: Diagnosis not present

## 2020-09-04 DIAGNOSIS — M1711 Unilateral primary osteoarthritis, right knee: Secondary | ICD-10-CM | POA: Diagnosis not present

## 2020-09-12 DIAGNOSIS — M5136 Other intervertebral disc degeneration, lumbar region: Secondary | ICD-10-CM | POA: Diagnosis not present

## 2020-09-12 DIAGNOSIS — M62830 Muscle spasm of back: Secondary | ICD-10-CM | POA: Diagnosis not present

## 2020-09-12 DIAGNOSIS — M9903 Segmental and somatic dysfunction of lumbar region: Secondary | ICD-10-CM | POA: Diagnosis not present

## 2020-09-12 DIAGNOSIS — M5416 Radiculopathy, lumbar region: Secondary | ICD-10-CM | POA: Diagnosis not present

## 2020-09-12 DIAGNOSIS — F418 Other specified anxiety disorders: Secondary | ICD-10-CM | POA: Diagnosis not present

## 2020-09-23 ENCOUNTER — Ambulatory Visit
Admission: RE | Admit: 2020-09-23 | Discharge: 2020-09-23 | Disposition: A | Discharge status: Home / Self Care | Payer: Federal, State, Local not specified - PPO | Source: Ambulatory Visit | Attending: Certified Nurse Midwife | Admitting: Certified Nurse Midwife

## 2020-09-23 ENCOUNTER — Other Ambulatory Visit: Payer: Self-pay

## 2020-09-23 DIAGNOSIS — Z1231 Encounter for screening mammogram for malignant neoplasm of breast: Secondary | ICD-10-CM | POA: Diagnosis not present

## 2020-09-23 DIAGNOSIS — Z01419 Encounter for gynecological examination (general) (routine) without abnormal findings: Secondary | ICD-10-CM

## 2020-10-01 DIAGNOSIS — M5136 Other intervertebral disc degeneration, lumbar region: Secondary | ICD-10-CM | POA: Diagnosis not present

## 2020-10-01 DIAGNOSIS — M5416 Radiculopathy, lumbar region: Secondary | ICD-10-CM | POA: Diagnosis not present

## 2020-10-01 DIAGNOSIS — M62830 Muscle spasm of back: Secondary | ICD-10-CM | POA: Diagnosis not present

## 2020-10-01 DIAGNOSIS — M9903 Segmental and somatic dysfunction of lumbar region: Secondary | ICD-10-CM | POA: Diagnosis not present

## 2020-10-03 DIAGNOSIS — F418 Other specified anxiety disorders: Secondary | ICD-10-CM | POA: Diagnosis not present

## 2020-10-17 DIAGNOSIS — F418 Other specified anxiety disorders: Secondary | ICD-10-CM | POA: Diagnosis not present

## 2020-10-22 DIAGNOSIS — M62830 Muscle spasm of back: Secondary | ICD-10-CM | POA: Diagnosis not present

## 2020-10-22 DIAGNOSIS — M9903 Segmental and somatic dysfunction of lumbar region: Secondary | ICD-10-CM | POA: Diagnosis not present

## 2020-10-22 DIAGNOSIS — M5416 Radiculopathy, lumbar region: Secondary | ICD-10-CM | POA: Diagnosis not present

## 2020-10-22 DIAGNOSIS — M5136 Other intervertebral disc degeneration, lumbar region: Secondary | ICD-10-CM | POA: Diagnosis not present

## 2020-10-24 DIAGNOSIS — M1711 Unilateral primary osteoarthritis, right knee: Secondary | ICD-10-CM | POA: Diagnosis not present

## 2020-11-01 DIAGNOSIS — F418 Other specified anxiety disorders: Secondary | ICD-10-CM | POA: Diagnosis not present

## 2020-11-12 DIAGNOSIS — M5416 Radiculopathy, lumbar region: Secondary | ICD-10-CM | POA: Diagnosis not present

## 2020-11-12 DIAGNOSIS — M9903 Segmental and somatic dysfunction of lumbar region: Secondary | ICD-10-CM | POA: Diagnosis not present

## 2020-11-12 DIAGNOSIS — M5136 Other intervertebral disc degeneration, lumbar region: Secondary | ICD-10-CM | POA: Diagnosis not present

## 2020-11-12 DIAGNOSIS — M62830 Muscle spasm of back: Secondary | ICD-10-CM | POA: Diagnosis not present

## 2020-11-18 DIAGNOSIS — F418 Other specified anxiety disorders: Secondary | ICD-10-CM | POA: Diagnosis not present

## 2020-11-29 NOTE — Discharge Instructions (Signed)
Instructions after Total Knee Replacement   Grisell Bissette P. Carnell Casamento, Jr., M.D.     Dept. of Orthopaedics & Sports Medicine  [facility]  [address]  [place], [place]  [address]  Phone: [phone]   Fax: [phone]    DIET: Drink plenty of non-alcoholic fluids. Resume your normal diet. Include foods high in fiber.  ACTIVITY:  You may use crutches or a walker with weight-bearing as tolerated, unless instructed otherwise. You may be weaned off of the walker or crutches by your Physical Therapist.  Do NOT place pillows under the knee. Anything placed under the knee could limit your ability to straighten the knee.   Continue doing gentle exercises. Exercising will reduce the pain and swelling, increase motion, and prevent muscle weakness.   Please continue to use the TED compression stockings for 6 weeks. You may remove the stockings at night, but should reapply them in the morning. Do not drive or operate any equipment until instructed.  WOUND CARE:  Continue to use the PolarCare or ice packs periodically to reduce pain and swelling. You may bathe or shower after the staples are removed at the first office visit following surgery.  MEDICATIONS: You may resume your regular medications. Please take the pain medication as prescribed on the medication. Do not take pain medication on an empty stomach. You have been given a prescription for a blood thinner (Lovenox or Coumadin). Please take the medication as instructed. (NOTE: After completing a 2 week course of Lovenox, take one Enteric-coated aspirin once a day. This along with elevation will help reduce the possibility of phlebitis in your operated leg.) Do not drive or drink alcoholic beverages when taking pain medications.  CALL THE OFFICE FOR: Temperature above 101 degrees Excessive bleeding or drainage on the dressing. Excessive swelling, coldness, or paleness of the toes. Persistent nausea and vomiting.  FOLLOW-UP:  You  should have an appointment to return to the office in 10-14 days after surgery. Arrangements have been made for continuation of Physical Therapy (either home therapy or outpatient therapy).   [facility] Department Directory         [email or web address]       [email or web address]          Cardiology  Appointments: [place] - [phone] [place] - [phone]  Endocrinology  Appointments: [place] - [phone] [place] - [phone]  Gastroenterology  Appointments: [place] - [phone] [place] - [phone]        General Surgery   Appointments: [place] - [phone]  Internal Medicine/Family Medicine  Appointments: [place] - [phone] [place] - [phone] [place] - [phone]  Metabolic and Weigh Loss Surgery  Appointments: [place] - [phone]        Neurology  Appointments: [place] - [phone] [place] - [phone]  Neurosurgery  Appointments: [place] - [phone]  Obstetrics & Gynecology  Appointments: [place] - [phone] [place] - [phone]        Pediatrics  Appointments: [place] - [phone] [place] - [phone]  Physiatry  Appointments: [place] -[phone]  Physical Therapy  Appointments: [place] - [phone] [place] - [phone]        Podiatry  Appointments: [place] - [phone] [place] - [phone]  Pulmonology  Appointments: [place] - [phone]  Rheumatology  Appointments: [place] - [phone]        [place] Location: [facility]  [address] , [place]  [address]  [place] Location: [facility] [address] [place], [place]  [address]  [place] Location: [facility] [address] [place],   [address]    

## 2020-12-06 DIAGNOSIS — F418 Other specified anxiety disorders: Secondary | ICD-10-CM | POA: Diagnosis not present

## 2020-12-11 DIAGNOSIS — M5136 Other intervertebral disc degeneration, lumbar region: Secondary | ICD-10-CM | POA: Diagnosis not present

## 2020-12-11 DIAGNOSIS — M5416 Radiculopathy, lumbar region: Secondary | ICD-10-CM | POA: Diagnosis not present

## 2020-12-11 DIAGNOSIS — M62830 Muscle spasm of back: Secondary | ICD-10-CM | POA: Diagnosis not present

## 2020-12-11 DIAGNOSIS — M9903 Segmental and somatic dysfunction of lumbar region: Secondary | ICD-10-CM | POA: Diagnosis not present

## 2020-12-18 ENCOUNTER — Encounter: Payer: Self-pay | Admitting: Family

## 2020-12-20 DIAGNOSIS — F418 Other specified anxiety disorders: Secondary | ICD-10-CM | POA: Diagnosis not present

## 2020-12-25 ENCOUNTER — Other Ambulatory Visit: Payer: Self-pay

## 2020-12-25 ENCOUNTER — Other Ambulatory Visit
Admission: RE | Admit: 2020-12-25 | Discharge: 2020-12-25 | Disposition: A | Discharge status: Home / Self Care | Payer: Federal, State, Local not specified - PPO | Source: Ambulatory Visit | Attending: Orthopedic Surgery | Admitting: Orthopedic Surgery

## 2020-12-25 DIAGNOSIS — Z01818 Encounter for other preprocedural examination: Secondary | ICD-10-CM | POA: Diagnosis not present

## 2020-12-25 DIAGNOSIS — Z01810 Encounter for preprocedural cardiovascular examination: Secondary | ICD-10-CM | POA: Diagnosis not present

## 2020-12-25 HISTORY — DX: Personal history of urinary calculi: Z87.442

## 2020-12-25 LAB — COMPREHENSIVE METABOLIC PANEL
ALT: 12 U/L (ref 0–44)
AST: 14 U/L — ABNORMAL LOW (ref 15–41)
Albumin: 3.7 g/dL (ref 3.5–5.0)
Alkaline Phosphatase: 90 U/L (ref 38–126)
Anion gap: 7 (ref 5–15)
BUN: 15 mg/dL (ref 6–20)
CO2: 25 mmol/L (ref 22–32)
Calcium: 9 mg/dL (ref 8.9–10.3)
Chloride: 106 mmol/L (ref 98–111)
Creatinine, Ser: 0.81 mg/dL (ref 0.44–1.00)
GFR, Estimated: >60 mL/min (ref >60)
Glucose, Bld: 109 mg/dL — ABNORMAL HIGH (ref 70–99)
Potassium: 4.2 mmol/L (ref 3.5–5.1)
Sodium: 138 mmol/L (ref 135–145)
Total Bilirubin: 0.5 mg/dL (ref 0.3–1.2)
Total Protein: 6.9 g/dL (ref 6.5–8.1)

## 2020-12-25 LAB — CBC
HCT: 44 % (ref 36.0–46.0)
Hemoglobin: 13.9 g/dL (ref 12.0–15.0)
MCH: 25.6 pg — ABNORMAL LOW (ref 26.0–34.0)
MCHC: 31.6 g/dL (ref 30.0–36.0)
MCV: 81.2 fL (ref 80.0–100.0)
Platelets: 414 10*3/uL — ABNORMAL HIGH (ref 150–400)
RBC: 5.42 MIL/uL — ABNORMAL HIGH (ref 3.87–5.11)
RDW: 14.7 % (ref 11.5–15.5)
WBC: 13.9 10*3/uL — ABNORMAL HIGH (ref 4.0–10.5)
nRBC: 0 % (ref 0.0–0.2)

## 2020-12-25 LAB — URINALYSIS, ROUTINE W REFLEX MICROSCOPIC
Bacteria, UA: NONE SEEN
Bilirubin Urine: NEGATIVE
Glucose, UA: NEGATIVE mg/dL
Ketones, ur: NEGATIVE mg/dL
Leukocytes,Ua: NEGATIVE
Nitrite: NEGATIVE
Protein, ur: NEGATIVE mg/dL
Specific Gravity, Urine: 1.021 (ref 1.005–1.030)
pH: 7 (ref 5.0–8.0)

## 2020-12-25 LAB — PROTIME-INR
INR: 1 (ref 0.8–1.2)
Prothrombin Time: 12.7 seconds (ref 11.4–15.2)

## 2020-12-25 LAB — TYPE AND SCREEN
ABO/RH(D): O POS
Antibody Screen: NEGATIVE

## 2020-12-25 LAB — C-REACTIVE PROTEIN: CRP: 2.5 mg/dL — ABNORMAL HIGH (ref <1.0)

## 2020-12-25 LAB — SEDIMENTATION RATE: Sed Rate: 18 mm/hr (ref 0–30)

## 2020-12-25 LAB — SURGICAL PCR SCREEN
MRSA, PCR: NEGATIVE
Staphylococcus aureus: POSITIVE — AB

## 2020-12-25 LAB — APTT: aPTT: 27 seconds (ref 24–36)

## 2020-12-25 NOTE — Patient Instructions (Addendum)
Your procedure is scheduled on:01-03-21 FRIDAY Report to the Registration Desk on the 1st floor of the [facility]-Then proceed to the 2nd floor Surgery Desk in the [place] To find out your arrival time, please call [phone] between 1PM - 3PM on:01-02-21 THURSDAY  REMEMBER: Instructions that are not followed completely may result in serious medical risk, up to and including death; or upon the discretion of your surgeon and anesthesiologist your surgery may need to be rescheduled.  Do not eat food after midnight the night before surgery.  No gum chewing, lozengers or hard candies.  You may however, drink CLEAR liquids up to 2 hours before you are scheduled to arrive for your surgery. Do not drink anything within 2 hours of your scheduled arrival time.  Clear liquids include: - water  - apple juice without pulp - gatorade  - black coffee or tea (Do NOT add milk or creamers to the coffee or tea) Do NOT drink anything that is not on this list  In addition, your doctor has ordered for you to drink the provided  Ensure Pre-Surgery Clear Carbohydrate Drink  Drinking this carbohydrate drink up to two hours before surgery helps to reduce insulin resistance and improve patient outcomes. Please complete drinking 2 hours prior to scheduled arrival time.  The morning of surgery with a small sip of water take: -Prilosec (Omeprazole)-take one the night before surgery and one the morning of surgery  One week prior to surgery: Stop Anti-inflammatories (NSAIDS) such as Advil, Aleve, Ibuprofen, Motrin, Naproxen, Naprosyn and Aspirin based products such as Excedrin, Goodys Powder, BC Powder.You may however, continue to take Tylenol if needed for pain up until the day of surgery.You may continue Celebrex if needed up until the day prior to your surgery  Stop Pena supplements/vitamins NOW (12-25-20) until after surgery.  No Alcohol for 24 hours before or after surgery.  No Smoking  including e-cigarettes for 24 hours prior to surgery.  No chewable tobacco products for at least 6 hours prior to surgery.  No nicotine patches on the day of surgery.  Do not use any "recreational" drugs for at least a week prior to your surgery.  Please be advised that the combination of cocaine and anesthesia may have negative outcomes, up to and including death. If you test positive for cocaine, your surgery will be cancelled.  On the morning of surgery brush your teeth with toothpaste and water, you may rinse your mouth with mouthwash if you wish. Do not swallow any toothpaste or mouthwash.  Do not wear jewelry, make-up, hairpins, clips or nail polish.  Do not wear lotions, powders, or perfumes.   Do not shave body from the neck down 48 hours prior to surgery just in case you cut yourself which could leave a site for infection.  Also, freshly shaved skin may become irritated if using the CHG soap.  Contact lenses, hearing aids and dentures may not be worn into surgery.  Do not bring valuables to the hospital. [facility] is not responsible for any missing/lost belongings or valuables.   Use CHG Soap as directed on instruction sheet  Notify your doctor if there is any change in your medical condition (cold, fever, infection).  Wear comfortable clothing (specific to your surgery type) to the hospital.  After surgery, you can help prevent lung complications by doing breathing exercises.  Take deep breaths and cough every 1-2 hours. Your doctor may order a device called an Chiropodist to  help you take deep breaths. When coughing or sneezing, hold a pillow firmly against your incision with both hands. This is called "splinting." Doing this helps protect your incision. It also decreases belly discomfort.  If you are being admitted to the hospital overnight, leave your suitcase in the car. After surgery it may be brought to your room.  If you are being discharged the day of  surgery, you will not be allowed to drive home. You will need a responsible adult (18 years or older) to drive you home and stay with you that night.   If you are taking public transportation, you will need to have a responsible adult (18 years or older) with you. Please confirm with your physician that it is acceptable to use public transportation.   Please call the [place] Dept. at [phone] if you have any questions about these instructions.  Surgery Visitation Policy:  Patients undergoing a surgery or procedure may have one family member or support person with them as long as that person is not COVID-19 positive or experiencing its symptoms.  That person may remain in the waiting area during the procedure.  Inpatient Visitation:    Visiting hours are 7 a.m. to 8 p.m. Inpatients will be allowed two visitors daily. The visitors may change each day during the patient's stay. No visitors under the age of 32. Any visitor under the age of 62 must be accompanied by an adult. The visitor must pass COVID-19 screenings, use hand sanitizer when entering and exiting the patient's room and wear a mask at all times, including in the patient's room. Patients must also wear a mask when staff or their visitor are in the room. Masking is required regardless of vaccination status.

## 2020-12-26 LAB — URINE CULTURE
Culture: <10000 — AB
Special Requests: NORMAL

## 2020-12-30 ENCOUNTER — Other Ambulatory Visit: Payer: Self-pay

## 2020-12-30 ENCOUNTER — Ambulatory Visit: Payer: Federal, State, Local not specified - PPO | Admitting: Family

## 2020-12-30 ENCOUNTER — Encounter: Payer: Self-pay | Admitting: Family

## 2020-12-30 VITALS — BP 122/82 | HR 113 | Temp 98.0°F | Ht 61.0 in | Wt 207.0 lb

## 2020-12-30 DIAGNOSIS — Z630 Problems in relationship with spouse or partner: Secondary | ICD-10-CM | POA: Diagnosis not present

## 2020-12-30 DIAGNOSIS — E119 Type 2 diabetes mellitus without complications: Secondary | ICD-10-CM

## 2020-12-30 LAB — IGE: IgE (Immunoglobulin E), Serum: 16 IU/mL (ref 6–495)

## 2020-12-30 NOTE — Assessment & Plan Note (Signed)
Discussed patient's concern regarding husband. Discussed that I cannot discuss his personal health with her. She may encourage him to make a follow up or accompany him in a visit if he were to be agreeable. Advised her to let me know how I support her and her health.

## 2020-12-30 NOTE — Progress Notes (Signed)
Subjective:    Patient ID: Sierra Moore, female    DOB: 10-01-1970, 50 y.o.   MRN: [id number]  CC: Sierra Moore is a 50 y.o. female who presents today for an acute visit.    HPI: She is primary here to talk about her husband, who is a patient of mine. She is worried about his health.   She is concerned in regards to memory in the last she feels more noticeable in the past couple of weeks. Husband is unaware of this visit and hasnt voice concerned regarding memory.    Examples may include when he walks in the kitchen what he is looking for, familiar places, conversations that happened day prior.  He hasnt gotten lost when driving or forgotten names. Work is going well.   She is concerned about his depression and not being 'as present'. He has not stated any thoughts of hurting himself or anyone else.   She thinks sleep is not restorative, and he snores.  H/o alcohol abuse. He has been sober 12-15 years.   She has upcoming right knee replacement Dr Sierra Moore.  H/o diabetes .    Colonoscopy and mammogram UTD.   HISTORY:  Past Medical History:  Diagnosis Date   Allergy    Arthritis    POLYARTHRALGIA   GERD (gastroesophageal reflux disease)    occ   History of kidney stones    currently as of 12-25-20   Leg swelling    Obesity    PMDD (premenstrual dysphoric disorder)    PMDD (premenstrual dysphoric disorder)    Raynaud disease    Vitamin D deficiency    Past Surgical History:  Procedure Laterality Date   COLONOSCOPY WITH PROPOFOL N/A 04/27/2019   Procedure: COLONOSCOPY WITH BIOPSY;  Surgeon: Sierra Lame, MD;  Location: [place];  Service: Endoscopy;  Laterality: N/A;   ESOPHAGOGASTRODUODENOSCOPY (EGD) WITH PROPOFOL N/A 04/27/2019   Procedure: ESOPHAGOGASTRODUODENOSCOPY (EGD) WITH PROPOFOL;  Surgeon: Sierra Lame, MD;  Location: [place];  Service: Endoscopy;  Laterality: N/A;   POLYPECTOMY N/A 04/27/2019   Procedure: POLYPECTOMY;  Surgeon:  Sierra Lame, MD;  Location: [place];  Service: Endoscopy;  Laterality: N/A;   ROBOTIC ASSISTED LAPAROSCOPIC CHOLECYSTECTOMY N/A 01/10/2019   Procedure: ROBOTIC ASSISTED LAPAROSCOPIC CHOLECYSTECTOMY;  Surgeon: Sierra Husbands, MD;  Location: [facility];  Service: General;  Laterality: N/A;   TONSILLECTOMY     Family History  Problem Relation Age of Onset   Arthritis Mother    Raynaud syndrome Mother    Arthritis Father    Breast cancer Neg Hx    Bladder Cancer Neg Hx    Kidney cancer Neg Hx     Allergies: Penicillins Current Outpatient Medications on File Prior to Visit  Medication Sig Dispense Refill   acetaminophen (TYLENOL) 500 MG tablet Take 500 mg by mouth every 6 (six) hours as needed (for pain).     celecoxib (CELEBREX) 100 MG capsule Take 100 mg by mouth 2 (two) times daily as needed (pain/inflammation).     diclofenac Sodium (VOLTAREN) 1 % GEL Apply 2 g topically 4 (four) times daily as needed (pain.).     fluticasone (FLONASE) 50 MCG/ACT nasal spray Place 1 spray into both nostrils daily as needed (allergies).     ibuprofen (ADVIL) 200 MG tablet Take 400-600 mg by mouth every 8 (eight) hours as needed (pain).     LESSINA-28 0.1-20 MG-MCG tablet Take 1 tablet by mouth daily. (Patient taking differently: Take 1 tablet  by mouth every evening.) 63 tablet 3   omeprazole (PRILOSEC OTC) 20 MG tablet Take 20 mg by mouth as needed.     No current facility-administered medications on file prior to visit.    Social History   Tobacco Use   Smoking status: Former    Packs/day: 0.50    Years: 10.00    Pack years: 5.00    Types: Cigarettes    Quit date: 2005    Years since quitting: 17.4   Smokeless tobacco: Never  Vaping Use   Vaping Use: Never used  Substance Use Topics   Alcohol use: No   Drug use: No    Review of Systems  Constitutional:  Negative for chills and fever.  Respiratory:  Negative for cough.   Cardiovascular:  Negative for chest pain and  palpitations.  Gastrointestinal:  Negative for nausea and vomiting.     Objective:    BP 122/82 (BP Location: Left Arm, Patient Position: Sitting, Cuff Size: Large)   Pulse (!) 113   Temp 98 F (36.7 C) (Oral)   Ht 5\' 1"  (1.549 m)   Wt 207 lb (93.9 kg)   LMP  (LMP Unknown)   SpO2 99%   BMI 39.11 kg/m    Physical Exam Vitals reviewed.  Constitutional:      Appearance: She is well-developed.  Eyes:     Conjunctiva/sclera: Conjunctivae normal.  Cardiovascular:     Rate and Rhythm: Normal rate and regular rhythm.     Pulses: Normal pulses.     Heart sounds: Normal heart sounds.  Pulmonary:     Effort: Pulmonary effort is normal.     Breath sounds: Normal breath sounds. No wheezing, rhonchi or rales.  Skin:    General: Skin is warm and dry.  Neurological:     Mental Status: She is alert.  Psychiatric:        Speech: Speech normal.        Behavior: Behavior normal.        Thought Content: Thought content normal.       Assessment & Plan:      I am having Sierra Moore maintain her diclofenac Sodium, fluticasone, LESSINA-28, celecoxib, acetaminophen, ibuprofen, and omeprazole.   No orders of the defined types were placed in this encounter.   Return precautions given.   Risks, benefits, and alternatives of the medications and treatment plan prescribed today were discussed, and patient expressed understanding.   Education regarding symptom management and diagnosis given to patient on AVS.  Continue to follow with Sierra Hawthorne, FNP for routine health maintenance.   Kirby Funk and I agreed with plan.   Sierra Paris, FNP

## 2020-12-30 NOTE — Assessment & Plan Note (Signed)
Pending a1c. Advised she is overdue for DM health maintenance. Advised to return for regular follow up every 3 months in setting of DM.

## 2021-01-01 ENCOUNTER — Other Ambulatory Visit
Admission: RE | Admit: 2021-01-01 | Discharge: 2021-01-01 | Disposition: A | Discharge status: Home / Self Care | Payer: Federal, State, Local not specified - PPO | Source: Ambulatory Visit | Attending: Orthopedic Surgery | Admitting: Orthopedic Surgery

## 2021-01-01 ENCOUNTER — Other Ambulatory Visit: Payer: Self-pay

## 2021-01-01 DIAGNOSIS — Z01812 Encounter for preprocedural laboratory examination: Secondary | ICD-10-CM | POA: Insufficient documentation

## 2021-01-01 DIAGNOSIS — E119 Type 2 diabetes mellitus without complications: Secondary | ICD-10-CM | POA: Diagnosis not present

## 2021-01-01 DIAGNOSIS — Z793 Long term (current) use of hormonal contraceptives: Secondary | ICD-10-CM | POA: Diagnosis not present

## 2021-01-01 DIAGNOSIS — Z8261 Family history of arthritis: Secondary | ICD-10-CM | POA: Diagnosis not present

## 2021-01-01 DIAGNOSIS — Z6837 Body mass index (BMI) 37.0-37.9, adult: Secondary | ICD-10-CM | POA: Diagnosis not present

## 2021-01-01 DIAGNOSIS — J302 Other seasonal allergic rhinitis: Secondary | ICD-10-CM | POA: Diagnosis not present

## 2021-01-01 DIAGNOSIS — M1711 Unilateral primary osteoarthritis, right knee: Secondary | ICD-10-CM | POA: Diagnosis not present

## 2021-01-01 DIAGNOSIS — K219 Gastro-esophageal reflux disease without esophagitis: Secondary | ICD-10-CM | POA: Diagnosis not present

## 2021-01-01 DIAGNOSIS — Z880 Allergy status to penicillin: Secondary | ICD-10-CM | POA: Diagnosis not present

## 2021-01-01 DIAGNOSIS — Z9049 Acquired absence of other specified parts of digestive tract: Secondary | ICD-10-CM | POA: Diagnosis not present

## 2021-01-01 DIAGNOSIS — Z87891 Personal history of nicotine dependence: Secondary | ICD-10-CM | POA: Diagnosis not present

## 2021-01-01 DIAGNOSIS — Z79899 Other long term (current) drug therapy: Secondary | ICD-10-CM | POA: Diagnosis not present

## 2021-01-01 DIAGNOSIS — E669 Obesity, unspecified: Secondary | ICD-10-CM | POA: Diagnosis not present

## 2021-01-01 DIAGNOSIS — Z20822 Contact with and (suspected) exposure to covid-19: Secondary | ICD-10-CM | POA: Insufficient documentation

## 2021-01-01 DIAGNOSIS — M25761 Osteophyte, right knee: Secondary | ICD-10-CM | POA: Diagnosis not present

## 2021-01-01 DIAGNOSIS — E785 Hyperlipidemia, unspecified: Secondary | ICD-10-CM | POA: Diagnosis not present

## 2021-01-01 LAB — SARS CORONAVIRUS 2 (TAT 6-24 HRS): SARS Coronavirus 2: NEGATIVE

## 2021-01-03 ENCOUNTER — Inpatient Hospital Stay
Admission: RE | Admit: 2021-01-03 | Discharge: 2021-01-05 | DRG: 470 | Disposition: A | Discharge status: Home Health | Payer: Federal, State, Local not specified - PPO | Attending: Orthopedic Surgery | Admitting: Orthopedic Surgery

## 2021-01-03 ENCOUNTER — Encounter
Admission: RE | Disposition: A | Discharge status: Home / Self Care | Payer: Self-pay | Source: Home / Self Care | Attending: Orthopedic Surgery

## 2021-01-03 ENCOUNTER — Inpatient Hospital Stay: Payer: Federal, State, Local not specified - PPO

## 2021-01-03 ENCOUNTER — Encounter: Payer: Self-pay | Admitting: Orthopedic Surgery

## 2021-01-03 ENCOUNTER — Inpatient Hospital Stay: Payer: Federal, State, Local not specified - PPO | Admitting: Certified Registered"

## 2021-01-03 ENCOUNTER — Other Ambulatory Visit: Payer: Self-pay

## 2021-01-03 DIAGNOSIS — Z96659 Presence of unspecified artificial knee joint: Secondary | ICD-10-CM

## 2021-01-03 DIAGNOSIS — M25761 Osteophyte, right knee: Secondary | ICD-10-CM | POA: Diagnosis present

## 2021-01-03 DIAGNOSIS — E119 Type 2 diabetes mellitus without complications: Secondary | ICD-10-CM | POA: Diagnosis present

## 2021-01-03 DIAGNOSIS — Z8261 Family history of arthritis: Secondary | ICD-10-CM

## 2021-01-03 DIAGNOSIS — Z79899 Other long term (current) drug therapy: Secondary | ICD-10-CM

## 2021-01-03 DIAGNOSIS — Z793 Long term (current) use of hormonal contraceptives: Secondary | ICD-10-CM

## 2021-01-03 DIAGNOSIS — K219 Gastro-esophageal reflux disease without esophagitis: Secondary | ICD-10-CM | POA: Diagnosis present

## 2021-01-03 DIAGNOSIS — E785 Hyperlipidemia, unspecified: Secondary | ICD-10-CM | POA: Diagnosis not present

## 2021-01-03 DIAGNOSIS — J302 Other seasonal allergic rhinitis: Secondary | ICD-10-CM | POA: Diagnosis present

## 2021-01-03 DIAGNOSIS — Z20822 Contact with and (suspected) exposure to covid-19: Secondary | ICD-10-CM | POA: Diagnosis present

## 2021-01-03 DIAGNOSIS — Z87891 Personal history of nicotine dependence: Secondary | ICD-10-CM | POA: Diagnosis not present

## 2021-01-03 DIAGNOSIS — Z880 Allergy status to penicillin: Secondary | ICD-10-CM

## 2021-01-03 DIAGNOSIS — E669 Obesity, unspecified: Secondary | ICD-10-CM | POA: Diagnosis present

## 2021-01-03 DIAGNOSIS — Z96651 Presence of right artificial knee joint: Secondary | ICD-10-CM

## 2021-01-03 DIAGNOSIS — Z9049 Acquired absence of other specified parts of digestive tract: Secondary | ICD-10-CM | POA: Diagnosis not present

## 2021-01-03 DIAGNOSIS — Z6837 Body mass index (BMI) 37.0-37.9, adult: Secondary | ICD-10-CM

## 2021-01-03 DIAGNOSIS — M1711 Unilateral primary osteoarthritis, right knee: Secondary | ICD-10-CM | POA: Diagnosis not present

## 2021-01-03 DIAGNOSIS — Z471 Aftercare following joint replacement surgery: Secondary | ICD-10-CM | POA: Diagnosis not present

## 2021-01-03 HISTORY — PX: KNEE ARTHROPLASTY: SHX992

## 2021-01-03 LAB — POCT PREGNANCY, URINE: Preg Test, Ur: NEGATIVE

## 2021-01-03 LAB — ABO/RH: ABO/RH(D): O POS

## 2021-01-03 SURGERY — ARTHROPLASTY, KNEE, TOTAL, USING IMAGELESS COMPUTER-ASSISTED NAVIGATION
Anesthesia: Spinal | Site: Knee | Laterality: Right

## 2021-01-03 MED ORDER — TRAMADOL HCL 50 MG PO TABS
50.0000 mg | ORAL_TABLET | ORAL | Status: DC | PRN
Start: 2021-01-03 — End: 2021-01-05
  Administered 2021-01-03: 50 mg via ORAL
  Administered 2021-01-03: 100 mg via ORAL
  Filled 2021-01-03: qty 1
  Filled 2021-01-03: qty 2

## 2021-01-03 MED ORDER — BISACODYL 10 MG RE SUPP
10.0000 mg | Freq: Every day | RECTAL | Status: DC | PRN
  Filled 2021-01-03: qty 1

## 2021-01-03 MED ORDER — GABAPENTIN 300 MG PO CAPS
ORAL_CAPSULE | ORAL | Status: AC
  Administered 2021-01-03: 300 mg via ORAL
  Filled 2021-01-03: qty 1

## 2021-01-03 MED ORDER — OXYCODONE HCL 5 MG PO TABS
5.0000 mg | ORAL_TABLET | Freq: Once | ORAL | Status: AC | PRN
  Administered 2021-01-03: 5 mg via ORAL

## 2021-01-03 MED ORDER — FERROUS SULFATE 325 (65 FE) MG PO TABS
325.0000 mg | ORAL_TABLET | Freq: Two times a day (BID) | ORAL | Status: DC
  Administered 2021-01-03 – 2021-01-05 (×4): 325 mg via ORAL
  Filled 2021-01-03 (×4): qty 1

## 2021-01-03 MED ORDER — PHENOL 1.4 % MT LIQD
1.0000 | OROMUCOSAL | Status: DC | PRN
  Filled 2021-01-03: qty 177

## 2021-01-03 MED ORDER — PHENYLEPHRINE HCL (PRESSORS) 10 MG/ML IV SOLN
INTRAVENOUS | Status: AC
  Filled 2021-01-03: qty 1

## 2021-01-03 MED ORDER — CEFAZOLIN SODIUM-DEXTROSE 2-4 GM/100ML-% IV SOLN
2.0000 g | INTRAVENOUS | Status: AC
  Administered 2021-01-03: 2 g via INTRAVENOUS

## 2021-01-03 MED ORDER — FENTANYL CITRATE (PF) 100 MCG/2ML IJ SOLN
INTRAMUSCULAR | Status: AC
  Filled 2021-01-03: qty 2

## 2021-01-03 MED ORDER — ENOXAPARIN SODIUM 30 MG/0.3ML IJ SOSY
30.0000 mg | PREFILLED_SYRINGE | Freq: Two times a day (BID) | INTRAMUSCULAR | Status: DC
  Administered 2021-01-04 – 2021-01-05 (×3): 30 mg via SUBCUTANEOUS
  Filled 2021-01-03 (×3): qty 0.3

## 2021-01-03 MED ORDER — DEXAMETHASONE SODIUM PHOSPHATE 10 MG/ML IJ SOLN
INTRAMUSCULAR | Status: AC
  Administered 2021-01-03: 8 mg via INTRAVENOUS
  Filled 2021-01-03: qty 1

## 2021-01-03 MED ORDER — ACETAMINOPHEN 10 MG/ML IV SOLN
1000.0000 mg | Freq: Four times a day (QID) | INTRAVENOUS | Status: AC
  Administered 2021-01-03 – 2021-01-04 (×3): 1000 mg via INTRAVENOUS
  Filled 2021-01-03 (×4): qty 100

## 2021-01-03 MED ORDER — PHENYLEPHRINE HCL (PRESSORS) 10 MG/ML IV SOLN
INTRAVENOUS | Status: DC | PRN
  Administered 2021-01-03: 50 ug via INTRAVENOUS
  Administered 2021-01-03: 100 ug via INTRAVENOUS
  Administered 2021-01-03 (×2): 50 ug via INTRAVENOUS

## 2021-01-03 MED ORDER — TRANEXAMIC ACID-NACL 1000-0.7 MG/100ML-% IV SOLN
INTRAVENOUS | Status: AC
  Administered 2021-01-03: 1000 mg via INTRAVENOUS
  Filled 2021-01-03: qty 100

## 2021-01-03 MED ORDER — PROPOFOL 10 MG/ML IV BOLUS
INTRAVENOUS | Status: AC
  Filled 2021-01-03: qty 20

## 2021-01-03 MED ORDER — GABAPENTIN 300 MG PO CAPS: 300.0000 mg | ORAL_CAPSULE | Freq: Once | ORAL | Status: AC

## 2021-01-03 MED ORDER — DIPHENHYDRAMINE HCL 12.5 MG/5ML PO ELIX
12.5000 mg | ORAL_SOLUTION | ORAL | Status: DC | PRN
  Filled 2021-01-03: qty 10

## 2021-01-03 MED ORDER — CELECOXIB 200 MG PO CAPS
200.0000 mg | ORAL_CAPSULE | Freq: Two times a day (BID) | ORAL | Status: DC
  Administered 2021-01-03 – 2021-01-05 (×4): 200 mg via ORAL
  Filled 2021-01-03 (×4): qty 1

## 2021-01-03 MED ORDER — SODIUM CHLORIDE 0.9 % IV SOLN: INTRAVENOUS | Status: DC

## 2021-01-03 MED ORDER — CHLORHEXIDINE GLUCONATE 4 % EX LIQD: 60.0000 mL | Freq: Once | CUTANEOUS | Status: DC

## 2021-01-03 MED ORDER — TRANEXAMIC ACID-NACL 1000-0.7 MG/100ML-% IV SOLN
1000.0000 mg | INTRAVENOUS | Status: AC
  Administered 2021-01-03: 1000 mg via INTRAVENOUS

## 2021-01-03 MED ORDER — DEXMEDETOMIDINE (PRECEDEX) IN NS 20 MCG/5ML (4 MCG/ML) IV SYRINGE
PREFILLED_SYRINGE | INTRAVENOUS | Status: AC
  Filled 2021-01-03: qty 5

## 2021-01-03 MED ORDER — ONDANSETRON HCL 4 MG PO TABS: 4.0000 mg | ORAL_TABLET | Freq: Four times a day (QID) | ORAL | Status: DC | PRN

## 2021-01-03 MED ORDER — SODIUM CHLORIDE FLUSH 0.9 % IV SOLN
INTRAVENOUS | Status: AC
  Filled 2021-01-03: qty 40

## 2021-01-03 MED ORDER — ORAL CARE MOUTH RINSE: 15.0000 mL | Freq: Once | OROMUCOSAL | Status: AC

## 2021-01-03 MED ORDER — OXYCODONE HCL 5 MG/5ML PO SOLN: 5.0000 mg | Freq: Once | ORAL | Status: AC | PRN

## 2021-01-03 MED ORDER — SODIUM CHLORIDE 0.9 % IR SOLN
Status: DC | PRN
  Administered 2021-01-03: 500 mL

## 2021-01-03 MED ORDER — FLEET ENEMA 7-19 GM/118ML RE ENEM: 1.0000 | ENEMA | Freq: Once | RECTAL | Status: DC | PRN

## 2021-01-03 MED ORDER — BUPIVACAINE HCL (PF) 0.25 % IJ SOLN
INTRAMUSCULAR | Status: DC | PRN
  Administered 2021-01-03: 60 mL

## 2021-01-03 MED ORDER — TRANEXAMIC ACID-NACL 1000-0.7 MG/100ML-% IV SOLN
INTRAVENOUS | Status: AC
  Filled 2021-01-03: qty 100

## 2021-01-03 MED ORDER — BUPIVACAINE HCL (PF) 0.5 % IJ SOLN
INTRAMUSCULAR | Status: DC | PRN
  Administered 2021-01-03: 3 mL via INTRATHECAL

## 2021-01-03 MED ORDER — CHLORHEXIDINE GLUCONATE 0.12 % MT SOLN: 15.0000 mL | Freq: Once | OROMUCOSAL | Status: AC

## 2021-01-03 MED ORDER — ACETAMINOPHEN 10 MG/ML IV SOLN
INTRAVENOUS | Status: AC
  Filled 2021-01-03: qty 100

## 2021-01-03 MED ORDER — BUPIVACAINE HCL (PF) 0.25 % IJ SOLN
INTRAMUSCULAR | Status: AC
  Filled 2021-01-03: qty 30

## 2021-01-03 MED ORDER — PROPOFOL 1000 MG/100ML IV EMUL
INTRAVENOUS | Status: AC
  Filled 2021-01-03: qty 100

## 2021-01-03 MED ORDER — OXYCODONE HCL 5 MG PO TABS
10.0000 mg | ORAL_TABLET | ORAL | Status: DC | PRN
  Administered 2021-01-03 – 2021-01-04 (×3): 10 mg via ORAL
  Filled 2021-01-03 (×4): qty 2

## 2021-01-03 MED ORDER — FENTANYL CITRATE (PF) 100 MCG/2ML IJ SOLN
INTRAMUSCULAR | Status: DC | PRN
  Administered 2021-01-03: 100 ug via INTRAVENOUS

## 2021-01-03 MED ORDER — SODIUM CHLORIDE 0.9 % IR SOLN
Status: DC | PRN
  Administered 2021-01-03: 3000 mL

## 2021-01-03 MED ORDER — ALUM & MAG HYDROXIDE-SIMETH 200-200-20 MG/5ML PO SUSP: 30.0000 mL | ORAL | Status: DC | PRN

## 2021-01-03 MED ORDER — MIDAZOLAM HCL 5 MG/5ML IJ SOLN
INTRAMUSCULAR | Status: DC | PRN
  Administered 2021-01-03: 2 mg via INTRAVENOUS

## 2021-01-03 MED ORDER — FENTANYL CITRATE (PF) 100 MCG/2ML IJ SOLN
25.0000 ug | INTRAMUSCULAR | Status: DC | PRN
  Administered 2021-01-03: 25 ug via INTRAVENOUS

## 2021-01-03 MED ORDER — CELECOXIB 200 MG PO CAPS: 400.0000 mg | ORAL_CAPSULE | Freq: Once | ORAL | Status: AC

## 2021-01-03 MED ORDER — CEFAZOLIN SODIUM-DEXTROSE 2-4 GM/100ML-% IV SOLN
INTRAVENOUS | Status: AC
  Administered 2021-01-03: 2 g via INTRAVENOUS
  Filled 2021-01-03: qty 100

## 2021-01-03 MED ORDER — HYDROMORPHONE HCL 1 MG/ML IJ SOLN: 0.5000 mg | INTRAMUSCULAR | Status: DC | PRN

## 2021-01-03 MED ORDER — LEVONORGESTREL-ETHINYL ESTRAD 0.1-20 MG-MCG PO TABS: 1.0000 | ORAL_TABLET | Freq: Every evening | ORAL | Status: DC

## 2021-01-03 MED ORDER — ACETAMINOPHEN 10 MG/ML IV SOLN
INTRAVENOUS | Status: DC | PRN
  Administered 2021-01-03: 1000 mg via INTRAVENOUS

## 2021-01-03 MED ORDER — OXYCODONE HCL 5 MG PO TABS
ORAL_TABLET | ORAL | Status: AC
  Filled 2021-01-03: qty 1

## 2021-01-03 MED ORDER — CEFAZOLIN SODIUM-DEXTROSE 2-4 GM/100ML-% IV SOLN
2.0000 g | Freq: Four times a day (QID) | INTRAVENOUS | Status: AC
Start: 2021-01-03 — End: 2021-01-03
  Administered 2021-01-03: 2 g via INTRAVENOUS
  Filled 2021-01-03 (×2): qty 100

## 2021-01-03 MED ORDER — ACETAMINOPHEN 325 MG PO TABS
325.0000 mg | ORAL_TABLET | Freq: Four times a day (QID) | ORAL | Status: DC | PRN
  Administered 2021-01-04 – 2021-01-05 (×3): 650 mg via ORAL
  Filled 2021-01-03 (×3): qty 2

## 2021-01-03 MED ORDER — MIDAZOLAM HCL 2 MG/2ML IJ SOLN
INTRAMUSCULAR | Status: AC
  Filled 2021-01-03: qty 2

## 2021-01-03 MED ORDER — ONDANSETRON HCL 4 MG/2ML IJ SOLN
INTRAMUSCULAR | Status: DC | PRN
  Administered 2021-01-03: 4 mg via INTRAVENOUS

## 2021-01-03 MED ORDER — ENSURE PRE-SURGERY PO LIQD
296.0000 mL | Freq: Once | ORAL | Status: AC
  Administered 2021-01-03: 296 mL via ORAL
  Filled 2021-01-03: qty 296

## 2021-01-03 MED ORDER — PROPOFOL 500 MG/50ML IV EMUL
INTRAVENOUS | Status: DC | PRN
  Administered 2021-01-03: 120 ug/kg/min via INTRAVENOUS

## 2021-01-03 MED ORDER — TRANEXAMIC ACID-NACL 1000-0.7 MG/100ML-% IV SOLN
1000.0000 mg | Freq: Once | INTRAVENOUS | Status: AC
Start: 2021-01-03 — End: 2021-01-03

## 2021-01-03 MED ORDER — NEOMYCIN-POLYMYXIN B GU 40-200000 IR SOLN
Status: AC
  Filled 2021-01-03: qty 2

## 2021-01-03 MED ORDER — SURGIPHOR WOUND IRRIGATION SYSTEM - OPTIME
TOPICAL | Status: DC | PRN
  Administered 2021-01-03: 1

## 2021-01-03 MED ORDER — LACTATED RINGERS IV SOLN: INTRAVENOUS | Status: DC

## 2021-01-03 MED ORDER — ONDANSETRON HCL 4 MG/2ML IJ SOLN: 4.0000 mg | Freq: Four times a day (QID) | INTRAMUSCULAR | Status: DC | PRN

## 2021-01-03 MED ORDER — DEXAMETHASONE SODIUM PHOSPHATE 10 MG/ML IJ SOLN: 8.0000 mg | Freq: Once | INTRAMUSCULAR | Status: AC

## 2021-01-03 MED ORDER — FENTANYL CITRATE (PF) 100 MCG/2ML IJ SOLN
INTRAMUSCULAR | Status: AC
  Administered 2021-01-03: 25 ug via INTRAVENOUS
  Filled 2021-01-03: qty 2

## 2021-01-03 MED ORDER — ONDANSETRON HCL 4 MG/2ML IJ SOLN
INTRAMUSCULAR | Status: AC
  Filled 2021-01-03: qty 2

## 2021-01-03 MED ORDER — SENNOSIDES-DOCUSATE SODIUM 8.6-50 MG PO TABS
1.0000 | ORAL_TABLET | Freq: Two times a day (BID) | ORAL | Status: DC
  Administered 2021-01-04 (×2): 1 via ORAL
  Filled 2021-01-03 (×2): qty 1

## 2021-01-03 MED ORDER — OXYCODONE HCL 5 MG PO TABS: 5.0000 mg | ORAL_TABLET | ORAL | Status: DC | PRN

## 2021-01-03 MED ORDER — MAGNESIUM HYDROXIDE 400 MG/5ML PO SUSP
30.0000 mL | Freq: Every day | ORAL | Status: DC
  Administered 2021-01-04: 30 mL via ORAL
  Filled 2021-01-03: qty 30

## 2021-01-03 MED ORDER — DEXMEDETOMIDINE (PRECEDEX) IN NS 20 MCG/5ML (4 MCG/ML) IV SYRINGE
PREFILLED_SYRINGE | INTRAVENOUS | Status: DC | PRN
  Administered 2021-01-03: 4 ug via INTRAVENOUS
  Administered 2021-01-03 (×2): 8 ug via INTRAVENOUS

## 2021-01-03 MED ORDER — PANTOPRAZOLE SODIUM 40 MG PO TBEC
40.0000 mg | DELAYED_RELEASE_TABLET | Freq: Two times a day (BID) | ORAL | Status: DC
  Administered 2021-01-03 – 2021-01-05 (×4): 40 mg via ORAL
  Filled 2021-01-03 (×4): qty 1

## 2021-01-03 MED ORDER — SODIUM CHLORIDE 0.9 % IV SOLN
INTRAVENOUS | Status: DC | PRN
  Administered 2021-01-03: 20 ug/min via INTRAVENOUS

## 2021-01-03 MED ORDER — BUPIVACAINE LIPOSOME 1.3 % IJ SUSP
INTRAMUSCULAR | Status: AC
  Filled 2021-01-03: qty 20

## 2021-01-03 MED ORDER — CELECOXIB 200 MG PO CAPS
ORAL_CAPSULE | ORAL | Status: AC
  Administered 2021-01-03: 400 mg via ORAL
  Filled 2021-01-03: qty 2

## 2021-01-03 MED ORDER — GLYCOPYRROLATE 0.2 MG/ML IJ SOLN
INTRAMUSCULAR | Status: DC | PRN
  Administered 2021-01-03: .2 mg via INTRAVENOUS

## 2021-01-03 MED ORDER — FLUTICASONE PROPIONATE 50 MCG/ACT NA SUSP
1.0000 | Freq: Every day | NASAL | Status: DC | PRN
  Filled 2021-01-03: qty 16

## 2021-01-03 MED ORDER — METOCLOPRAMIDE HCL 10 MG PO TABS
10.0000 mg | ORAL_TABLET | Freq: Three times a day (TID) | ORAL | Status: DC
  Administered 2021-01-03 – 2021-01-04 (×4): 10 mg via ORAL
  Filled 2021-01-03 (×5): qty 1

## 2021-01-03 MED ORDER — MENTHOL 3 MG MT LOZG
1.0000 | LOZENGE | OROMUCOSAL | Status: DC | PRN
  Filled 2021-01-03: qty 9

## 2021-01-03 MED ORDER — SODIUM CHLORIDE 0.9 % IV SOLN
INTRAVENOUS | Status: DC | PRN
  Administered 2021-01-03: 60 mL

## 2021-01-03 MED ORDER — CHLORHEXIDINE GLUCONATE 0.12 % MT SOLN
OROMUCOSAL | Status: AC
  Administered 2021-01-03: 15 mL via OROMUCOSAL
  Filled 2021-01-03: qty 15

## 2021-01-03 SURGICAL SUPPLY — 75 items
ATTUNE MED DOME PAT 32 KNEE (Knees) ×2 IMPLANT
ATTUNE PSFEM RTSZ4 NARCEM KNEE (Femur) ×2 IMPLANT
ATTUNE PSRP INSR SZ4 5 KNEE (Insert) ×2 IMPLANT
BASE TIBIAL ROT PLAT SZ 3 KNEE (Knees) ×1 IMPLANT
BATTERY INSTRU NAVIGATION (MISCELLANEOUS) ×8 IMPLANT
BLADE SAW 70X12.5 (BLADE) ×2 IMPLANT
BLADE SAW 90X13X1.19 OSCILLAT (BLADE) ×2 IMPLANT
BLADE SAW 90X25X1.19 OSCILLAT (BLADE) ×2 IMPLANT
BONE CEMENT GENTAMICIN (Cement) ×4 IMPLANT
CEMENT BONE GENTAMICIN 40 (Cement) ×2 IMPLANT
COOLER POLAR GLACIER W/PUMP (MISCELLANEOUS) ×2 IMPLANT
CUFF TOURN SGL QUICK 34 (TOURNIQUET CUFF) ×1
CUFF TRNQT CYL 34X4.125X (TOURNIQUET CUFF) ×1 IMPLANT
DRAPE 3/4 80X56 (DRAPES) ×2 IMPLANT
DRSG DERMACEA 8X12 NADH (GAUZE/BANDAGES/DRESSINGS) ×2 IMPLANT
DRSG MEPILEX SACRM 8.7X9.8 (GAUZE/BANDAGES/DRESSINGS) ×2 IMPLANT
DRSG OPSITE POSTOP 4X12 (GAUZE/BANDAGES/DRESSINGS) ×2 IMPLANT
DRSG OPSITE POSTOP 4X14 (GAUZE/BANDAGES/DRESSINGS) ×2 IMPLANT
DRSG TEGADERM 4X4.75 (GAUZE/BANDAGES/DRESSINGS) ×2 IMPLANT
DURAPREP 26ML APPLICATOR (WOUND CARE) ×4 IMPLANT
ELECT CAUTERY BLADE 6.4 (BLADE) ×2 IMPLANT
ELECT REM PT RETURN 9FT ADLT (ELECTROSURGICAL) ×2
ELECTRODE REM PT RTRN 9FT ADLT (ELECTROSURGICAL) ×1 IMPLANT
EX-PIN ORTHOLOCK NAV 4X150 (PIN) ×4 IMPLANT
GAUZE 4X4 16PLY ~~LOC~~+RFID DBL (SPONGE) ×2 IMPLANT
GLOVE SURG ENC MOIS LTX SZ7.5 (GLOVE) ×4 IMPLANT
GLOVE SURG ENC TEXT LTX SZ7.5 (GLOVE) ×4 IMPLANT
GLOVE SURG UNDER LTX SZ8 (GLOVE) ×2 IMPLANT
GLOVE SURG UNDER POLY LF SZ7.5 (GLOVE) ×2 IMPLANT
GOWN STRL REUS W/ TWL LRG LVL3 (GOWN DISPOSABLE) ×2 IMPLANT
GOWN STRL REUS W/ TWL XL LVL3 (GOWN DISPOSABLE) ×1 IMPLANT
GOWN STRL REUS W/TWL LRG LVL3 (GOWN DISPOSABLE) ×2
GOWN STRL REUS W/TWL XL LVL3 (GOWN DISPOSABLE) ×1
HEMOVAC 400CC 10FR (MISCELLANEOUS) ×2 IMPLANT
HOLDER FOLEY CATH W/STRAP (MISCELLANEOUS) ×2 IMPLANT
HOOD PEEL AWAY FLYTE STAYCOOL (MISCELLANEOUS) ×4 IMPLANT
IRRIGATION SURGIPHOR STRL (IV SOLUTION) ×2 IMPLANT
IV NS IRRIG 3000ML ARTHROMATIC (IV SOLUTION) ×2 IMPLANT
KIT TURNOVER KIT A (KITS) ×2 IMPLANT
KNIFE SCULPS 14X20 (INSTRUMENTS) ×2 IMPLANT
LABEL OR SOLS (LABEL) ×2 IMPLANT
MANIFOLD NEPTUNE II (INSTRUMENTS) ×4 IMPLANT
NDL SAFETY ECLIPSE 18X1.5 (NEEDLE) ×1 IMPLANT
NEEDLE HYPO 18GX1.5 SHARP (NEEDLE) ×1
NEEDLE SPNL 20GX3.5 QUINCKE YW (NEEDLE) ×4 IMPLANT
NS IRRIG 500ML POUR BTL (IV SOLUTION) ×2 IMPLANT
PACK TOTAL KNEE (MISCELLANEOUS) ×2 IMPLANT
PAD ABD DERMACEA PRESS 5X9 (GAUZE/BANDAGES/DRESSINGS) ×2 IMPLANT
PAD CAST CTTN 4X4 STRL (SOFTGOODS) ×1 IMPLANT
PAD WRAPON POLAR KNEE (MISCELLANEOUS) ×1 IMPLANT
PADDING CAST COTTON 4X4 STRL (SOFTGOODS) ×1
PENCIL SMOKE EVACUATOR COATED (MISCELLANEOUS) ×2 IMPLANT
PIN FIXATION 1/8DIA X 3INL (PIN) ×6 IMPLANT
PULSAVAC PLUS IRRIG FAN TIP (DISPOSABLE) ×2
SOL PREP PVP 2OZ (MISCELLANEOUS) ×2
SOLUTION PREP PVP 2OZ (MISCELLANEOUS) ×1 IMPLANT
SPONGE DRAIN TRACH 4X4 STRL 2S (GAUZE/BANDAGES/DRESSINGS) ×2 IMPLANT
SPONGE T-LAP 18X18 ~~LOC~~+RFID (SPONGE) ×6 IMPLANT
STAPLER SKIN PROX 35W (STAPLE) ×2 IMPLANT
STOCKINETTE BIAS CUT 6 980064 (GAUZE/BANDAGES/DRESSINGS) ×2 IMPLANT
STRAP TIBIA SHORT (MISCELLANEOUS) ×2 IMPLANT
SUCTION FRAZIER HANDLE 10FR (MISCELLANEOUS) ×2
SUCTION TUBE FRAZIER 10FR DISP (MISCELLANEOUS) ×2 IMPLANT
SUT VIC AB 0 CT1 36 (SUTURE) ×4 IMPLANT
SUT VIC AB 1 CT1 36 (SUTURE) ×4 IMPLANT
SUT VIC AB 2-0 CT2 27 (SUTURE) ×2 IMPLANT
SYR 20ML LL LF (SYRINGE) ×2 IMPLANT
SYR 30ML LL (SYRINGE) ×4 IMPLANT
TAPE PAPER 2X10 WHT MICROPORE (GAUZE/BANDAGES/DRESSINGS) ×2 IMPLANT
TIBIAL BASE ROT PLAT SZ 3 KNEE (Knees) ×2 IMPLANT
TIP FAN IRRIG PULSAVAC PLUS (DISPOSABLE) ×1 IMPLANT
TOWEL OR 17X26 4PK STRL BLUE (TOWEL DISPOSABLE) ×2 IMPLANT
TOWER CARTRIDGE SMART MIX (DISPOSABLE) ×2 IMPLANT
TRAY FOLEY MTR SLVR 16FR STAT (SET/KITS/TRAYS/PACK) ×2 IMPLANT
WRAPON POLAR PAD KNEE (MISCELLANEOUS) ×2

## 2021-01-03 NOTE — Transfer of Care (Signed)
Immediate Anesthesia Transfer of Care Note  Patient: Sierra Moore  Procedure(s) Performed: COMPUTER ASSISTED TOTAL KNEE ARTHROPLASTY - RNFA (Right: Knee)  Patient Location: PACU  Anesthesia Type:General  Level of Consciousness: drowsy  Airway & Oxygen Therapy: Patient Spontanous Breathing and Patient connected to face mask oxygen  Post-op Assessment: Report given to RN  Post vital signs: stable  Last Vitals:  Vitals Value Taken Time  BP    Temp    Pulse    Resp    SpO2      Last Pain:  Vitals:   01/03/21 0639  TempSrc: Oral  PainSc: 4          Complications: No notable events documented.

## 2021-01-03 NOTE — Evaluation (Signed)
Physical Therapy Evaluation Patient Details Name: Sierra Moore MRN: [id number] DOB: 09-Oct-1970 Today's Date: 01/03/2021   History of Present Illness  admitted for acute hospitalization s/p R TKR, WBAT (01/03/21)  Clinical Impression  Patient resting in bed upon arrival to room; husband and daughter at bedside.  At baseline, lives with husband in single-story home; 3 steps to enter, bilat rails.  Independantly ambulatory without assist device; working full-time as Gaffer.  Upon evaluation, alert and oriented; follows commands and generally agreeable to participation with session.  Rates R knee pain 6-7/10; meds received prior to session.  Demonstrates fair/good post op strength (3-/5) and ROM (5-72 degrees) to R knee; completes SLR without difficulty.  Does endorse significant residual sensory deficit throughout R LE due to spinal.  Currently requires min assist for bed mobility; close sup for unsupported sitting balance.  Onset of moderate dizziness with transition to upright; additional standing/OOB deferred at this time as result.  Will continue to assess/progress in subsequent sessions; may benefit form full orthostatic assessment if dizziness persists next session. Would benefit from skilled PT to address above deficits and promote optimal return to PLOF.; Recommend transition to HHPT upon discharge from acute hospitalization.       Follow Up Recommendations Home health PT    Equipment Recommendations  Rolling walker with 5" wheels;3in1 (PT)    Recommendations for Other Services       Precautions / Restrictions Precautions Precautions: Fall Restrictions Weight Bearing Restrictions: Yes RLE Weight Bearing: Weight bearing as tolerated      Mobility  Bed Mobility Overal bed mobility: Needs Assistance Bed Mobility: Supine to Sit;Sit to Supine     Supine to sit: Min assist Sit to supine: Min assist   General bed mobility comments: for R LE assist    Transfers                  General transfer comment: unsafe/unable due to residual spinal and moderate onset of dizziness with transition to upright  Ambulation/Gait             General Gait Details: unsafe/unable due to residual spinal and moderate onset of dizziness with transition to upright  Stairs            Wheelchair Mobility    Modified Rankin (Stroke Patients Only)       Balance Overall balance assessment: Needs assistance Sitting-balance support: No upper extremity supported;Feet supported Sitting balance-Leahy Scale: Good                                       Pertinent Vitals/Pain Pain Assessment: 0-10 Pain Score: 6  Pain Location: R knee Pain Descriptors / Indicators: Aching;Guarding;Grimacing Pain Intervention(s): Limited activity within patient's tolerance;Monitored during session;Repositioned    Home Living Family/patient expects to be discharged to:: Private residence Living Arrangements: Spouse/significant other;Children Available Help at Discharge: Family Type of Home: House Home Access: Stairs to enter Entrance Stairs-Rails: Right;Left;Can reach both Technical brewer of Steps: 3 Home Layout: One level Home Equipment: None      Prior Function Level of Independence: Independent         Comments: Indep with ADLs, household and community mobilization without assist device; working full-time as Chief of Staff        Extremity/Trunk Assessment   Upper Extremity Assessment Upper Extremity Assessment: Overall WFL for tasks assessed  Lower Extremity Assessment Lower Extremity Assessment: Overall WFL for tasks assessed (grossly 3-/5 throughout hip, knee and ankle; moderate paresthesia from residual spinal throughout R LE)       Communication   Communication: No difficulties  Cognition Arousal/Alertness: Awake/alert Behavior During Therapy: WFL for tasks assessed/performed Overall Cognitive  Status: Within Functional Limits for tasks assessed                                        General Comments      Exercises Total Joint Exercises Goniometric ROM: R knee: 5-72 degrees Other Exercises Other Exercises: Supine R LE therex, 1x10, active ROM: ankle pumps, quad sets, SAQs, heel slides, hip abduct/adduct, SLR.  Good isolated muscle activation and control Other Exercises: Rolling, mod indep for bedpan placement; RN informed/aware of patient status for assist off bedpan once patient ready.   Assessment/Plan    PT Assessment Patient needs continued PT services  PT Problem List Decreased strength;Decreased activity tolerance;Decreased balance;Decreased mobility;Decreased range of motion;Decreased knowledge of use of DME;Decreased safety awareness;Decreased knowledge of precautions;Cardiopulmonary status limiting activity;Pain       PT Treatment Interventions DME instruction;Gait training;Stair training;Functional mobility training;Therapeutic activities;Balance training;Patient/family education;Therapeutic exercise    PT Goals (Current goals can be found in the Care Plan section)  Acute Rehab PT Goals Patient Stated Goal: to return home PT Goal Formulation: With patient/family Time For Goal Achievement: 01/17/21 Potential to Achieve Goals: Good    Frequency BID   Barriers to discharge        Co-evaluation               AM-PAC PT "6 Clicks" Mobility  Outcome Measure Help needed turning from your back to your side while in a flat bed without using bedrails?: None Help needed moving from lying on your back to sitting on the side of a flat bed without using bedrails?: A Little Help needed moving to and from a bed to a chair (including a wheelchair)?: A Little Help needed standing up from a chair using your arms (e.g., wheelchair or bedside chair)?: A Lot Help needed to walk in hospital room?: A Lot Help needed climbing 3-5 steps with a railing? : A  Lot 6 Click Score: 16    End of Session   Activity Tolerance: Patient limited by pain (limited by residual spinal, dizziness with transition to upright) Patient left: in bed;with call bell/phone within reach;with bed alarm set Nurse Communication: Mobility status PT Visit Diagnosis: Other abnormalities of gait and mobility (R26.89);Pain Pain - Right/Left: Right Pain - part of body: Knee    Time: 9150-5697 PT Time Calculation (min) (ACUTE ONLY): 28 min   Charges:   PT Evaluation $PT Eval Moderate Complexity: 1 Mod PT Treatments $Therapeutic Exercise: 8-22 mins        Derry Arbogast H. Owens Shark, PT, DPT, NCS 01/03/21, 4:42 PM [phone]

## 2021-01-03 NOTE — Anesthesia Procedure Notes (Signed)
Spinal  Patient location during procedure: OR Start time: 01/03/2021 6:12 PM Reason for block: surgical anesthesia Staffing Performed: resident/CRNA  Preanesthetic Checklist Completed: patient identified, IV checked, site marked, risks and benefits discussed, surgical consent, monitors and equipment checked, pre-op evaluation and timeout performed Spinal Block Patient position: sitting Prep: DuraPrep Patient monitoring: heart rate, cardiac monitor, continuous pulse ox and blood pressure Approach: midline Location: L3-4 Injection technique: single-shot Needle Needle type: Pencan  Needle gauge: 24 G Needle length: 9 cm Assessment Sensory level: T4 Events: CSF return Additional Notes Attempt x1.  Atraumatic.  No pain with injection, negative heme, good free flow csf pre/post injection, negative paresthesia.  Pt tolerated well.

## 2021-01-03 NOTE — Anesthesia Preprocedure Evaluation (Signed)
Anesthesia Evaluation  Patient identified by MRN, date of birth, ID band Patient awake    Reviewed: Allergy & Precautions, NPO status , Patient's Chart, lab work & pertinent test results  History of Anesthesia Complications (+) POST - OP SPINAL HEADACHE and history of anesthetic complications  Airway Mallampati: III  TM Distance: <3 FB Neck ROM: full    Dental  (+) Chipped   Pulmonary former smoker,    Pulmonary exam normal        Cardiovascular Exercise Tolerance: Good negative cardio ROS Normal cardiovascular exam     Neuro/Psych PSYCHIATRIC DISORDERS negative neurological ROS     GI/Hepatic Neg liver ROS, GERD  Medicated and Controlled,  Endo/Other  diabetes, Type 2  Renal/GU      Musculoskeletal   Abdominal   Peds  Hematology negative hematology ROS (+)   Anesthesia Other Findings Past Medical History: No date: Allergy No date: Arthritis     Comment:  POLYARTHRALGIA No date: GERD (gastroesophageal reflux disease)     Comment:  occ No date: History of kidney stones     Comment:  currently as of 12-25-20 No date: Leg swelling No date: Obesity No date: PMDD (premenstrual dysphoric disorder) No date: PMDD (premenstrual dysphoric disorder) No date: Raynaud disease No date: Vitamin D deficiency  Past Surgical History: 04/27/2019: COLONOSCOPY WITH PROPOFOL; N/A     Comment:  Procedure: COLONOSCOPY WITH BIOPSY;  Surgeon: Lucilla Lame, MD;  Location: [facility];  Service:               Endoscopy;  Laterality: N/A; 04/27/2019: ESOPHAGOGASTRODUODENOSCOPY (EGD) WITH PROPOFOL; N/A     Comment:  Procedure: ESOPHAGOGASTRODUODENOSCOPY (EGD) WITH               PROPOFOL;  Surgeon: Lucilla Lame, MD;  Location: [facility];  Service: Endoscopy;  Laterality: N/A; 04/27/2019: POLYPECTOMY; N/A     Comment:  Procedure: POLYPECTOMY;  Surgeon: Lucilla Lame, MD;                 Location: [facility];  Service: Endoscopy;                Laterality: N/A; 01/10/2019: ROBOTIC ASSISTED LAPAROSCOPIC CHOLECYSTECTOMY; N/A     Comment:  Procedure: ROBOTIC ASSISTED LAPAROSCOPIC               CHOLECYSTECTOMY;  Surgeon: Jules Husbands, MD;  Location:              [facility];  Service: General;  Laterality: N/A; No date: TONSILLECTOMY  BMI    Body Mass Index: 37.79 kg/m      Reproductive/Obstetrics negative OB ROS                             Anesthesia Physical Anesthesia Plan  ASA: 3  Anesthesia Plan: Spinal   Post-op Pain Management:    Induction:   PONV Risk Score and Plan:   Airway Management Planned: Natural Airway and Nasal Cannula  Additional Equipment:   Intra-op Plan:   Post-operative Plan:   Informed Consent: I have reviewed the patients History and Physical, chart, labs and discussed the procedure including the risks, benefits and alternatives for the proposed anesthesia with the patient or authorized representative who has indicated his/her understanding and  acceptance.     Dental Advisory Given  Plan Discussed with: Anesthesiologist, CRNA and Surgeon  Anesthesia Plan Comments: (Patient reports a spinal headache 25 years ago.  Discussed the possible increased risk and patient would like [place] to attempt spinal again.  Patient reports no bleeding problems and no anticoagulant use.  Plan for spinal with backup GA  Patient consented for risks of anesthesia including but not limited to:  - adverse reactions to medications - damage to eyes, teeth, lips or other oral mucosa - nerve damage due to positioning  - risk of bleeding, infection and or nerve damage from spinal that could lead to paralysis - risk of headache or failed spinal - damage to teeth, lips or other oral mucosa - sore throat or hoarseness - damage to heart, brain, nerves, lungs, other parts of body or loss of life  Patient voiced understanding.)         Anesthesia Quick Evaluation

## 2021-01-03 NOTE — Op Note (Signed)
OPERATIVE NOTE  DATE OF SURGERY:  01/03/2021  PATIENT NAME:  Sierra Moore   DOB: Oct 11, 1970  MRN: [id number]  PRE-OPERATIVE DIAGNOSIS: Degenerative arthrosis of the right knee, primary  POST-OPERATIVE DIAGNOSIS:  Same  PROCEDURE:  Right total knee arthroplasty using computer-assisted navigation  SURGEON:  Marciano Sequin. M.D.  ANESTHESIA: spinal  ESTIMATED BLOOD LOSS: 50 mL  FLUIDS REPLACED: 1000 mL of crystalloid  TOURNIQUET TIME: 120 minutes  DRAINS: Medium Hemovac drains  SOFT TISSUE RELEASES: Anterior cruciate ligament, posterior cruciate ligament, deep medial collateral ligament, patellofemoral ligament  IMPLANTS UTILIZED: DePuy Attune size 4N posterior stabilized femoral component (cemented), size 3 rotating platform tibial component (cemented), 32 mm medialized dome patella (cemented), and a 5 mm stabilized rotating platform polyethylene insert.  INDICATIONS FOR SURGERY: Sierra Moore is a 50 y.o. year old female with a long history of progressive knee pain. X-rays demonstrated severe degenerative changes in tricompartmental fashion. The patient had not seen any significant improvement despite conservative nonsurgical intervention. After discussion of the risks and benefits of surgical intervention, the patient expressed understanding of the risks benefits and agree with plans for total knee arthroplasty.   The risks, benefits, and alternatives were discussed at length including but not limited to the risks of infection, bleeding, nerve injury, stiffness, blood clots, the need for revision surgery, cardiopulmonary complications, among others, and they were willing to proceed.  PROCEDURE IN DETAIL: The patient was brought into the operating room and, after adequate spinal anesthesia was achieved, a tourniquet was placed on the patient's upper thigh. The patient's knee and leg were cleaned and prepped with alcohol and DuraPrep and draped in the usual sterile fashion. A  "timeout" was performed as per usual protocol. The lower extremity was exsanguinated using an Esmarch, and the tourniquet was inflated to 300 mmHg. An anterior longitudinal incision was made followed by a standard mid vastus approach. The deep fibers of the medial collateral ligament were elevated in a subperiosteal fashion off of the medial flare of the tibia so as to maintain a continuous soft tissue sleeve. The patella was subluxed laterally and the patellofemoral ligament was incised. Inspection of the knee demonstrated severe degenerative changes with full-thickness loss of articular cartilage. Osteophytes were debrided using a rongeur. Anterior and posterior cruciate ligaments were excised. Two 4.0 mm Schanz pins were inserted in the femur and into the tibia for attachment of the array of trackers used for computer-assisted navigation. Hip center was identified using a circumduction technique. Distal landmarks were mapped using the computer. The distal femur and proximal tibia were mapped using the computer. The distal femoral cutting guide was positioned using computer-assisted navigation so as to achieve a 5 distal valgus cut. The femur was sized and it was felt that a size 4N femoral component was appropriate. A size 4 femoral cutting guide was positioned and the anterior cut was performed and verified using the computer. This was followed by completion of the posterior and chamfer cuts. Femoral cutting guide for the central box was then positioned in the center box cut was performed.  Attention was then directed to the proximal tibia. Medial and lateral menisci were excised. The extramedullary tibial cutting guide was positioned using computer-assisted navigation so as to achieve a 0 varus-valgus alignment and 3 posterior slope. The cut was performed and verified using the computer. The proximal tibia was sized and it was felt that a size 3 tibial tray was appropriate. Tibial and femoral trials were  inserted followed by  insertion of a 5 mm polyethylene insert.  The knee was felt to be tight in both flexion and in extension.  Trial components were removed and the extra medullary tibial cutting guide was repositioned so as to resect an additional 2 mm of bone.  The cut was performed verified using the computer.  Trial components were reinserted.  This allowed for excellent mediolateral soft tissue balancing both in flexion and in full extension. Finally, the patella was cut and prepared so as to accommodate a 32 mm medialized dome patella. A patella trial was placed and the knee was placed through a range of motion with excellent patellar tracking appreciated. The femoral trial was removed after debridement of posterior osteophytes. The central post-hole for the tibial component was reamed followed by insertion of a keel punch. Tibial trials were then removed. Cut surfaces of bone were irrigated with copious amounts of normal saline using pulsatile lavage and then suctioned dry. Polymethylmethacrylate cement with gentamicin was prepared in the usual fashion using a vacuum mixer. Cement was applied to the cut surface of the proximal tibia as well as along the undersurface of a size 3 rotating platform tibial component. Tibial component was positioned and impacted into place. Excess cement was removed using [employer] fast streamer. Cement was then applied to the cut surfaces of the femur as well as along the posterior flanges of the size 4N femoral component. The femoral component was positioned and impacted into place. Excess cement was removed using [employer] fast streamer. A 5 mm polyethylene trial was inserted and the knee was brought into full extension with steady axial compression applied. Finally, cement was applied to the backside of a 32 mm medialized dome patella and the patellar component was positioned and patellar clamp applied. Excess cement was removed using [employer] fast streamer. After adequate curing of the cement, the  tourniquet was deflated after a total tourniquet time of 120 minutes. Hemostasis was achieved using electrocautery. The knee was irrigated with copious amounts of normal saline using pulsatile lavage followed by 500 ml of Surgiphor and then suctioned dry. 20 mL of 1.3% Exparel and 60 mL of 0.25% Marcaine in 40 mL of normal saline was injected along the posterior capsule, medial and lateral gutters, and along the arthrotomy site. A 5 mm stabilized rotating platform polyethylene insert was inserted and the knee was placed through a range of motion with excellent mediolateral soft tissue balancing appreciated and excellent patellar tracking noted. 2 medium drains were placed in the wound bed and brought out through separate stab incisions. The medial parapatellar portion of the incision was reapproximated using interrupted sutures of #1 Vicryl. Subcutaneous tissue was approximated in layers using first #0 Vicryl followed #2-0 Vicryl. The skin was approximated with skin staples. A sterile dressing was applied.  The patient tolerated the procedure well and was transported to the recovery room in stable condition.    Telma Pyeatt P. Holley Bouche., M.D.

## 2021-01-03 NOTE — H&P (Signed)
The patient has been re-examined, and the chart reviewed, and there have been no interval changes to the documented history and physical.    The risks, benefits, and alternatives have been discussed at length. The patient expressed understanding of the risks benefits and agreed with plans for surgical intervention.  Sierra Moore, Jr. M.D.    

## 2021-01-03 NOTE — Progress Notes (Signed)
Request for Advanced Care Planning. Education Offered.     01/03/21 1900  Clinical Encounter Type  Visited With Patient and family together  Visit Type Initial  Referral From Nurse

## 2021-01-03 NOTE — H&P (Signed)
ORTHOPAEDIC HISTORY & PHYSICAL Gwenlyn Fudge, [place] - 12/24/2020 11:00 AM EDT Formatting of this note is different from the original. [facility] Chief Complaint:   Chief Complaint  Patient presents with   Knee Pain  H & P RIGHT KNEE   History of Present Illness:   Sierra Moore is a 50 y.o. female that presents to clinic today for her preoperative history and evaluation. Patient presents unaccompanied. The patient is scheduled to undergo a right total knee arthroplasty on 01/03/21 by Dr. Marry Guan. Her pain began around 5 years ago. The pain is located along the medial and anterior aspect of the knee. She describes her pain as worse with any weightbearing. She reports associated some giving way of the knee. She denies associated numbness or tingling, denies swelling or locking.   The patient's symptoms have progressed to the point that they decrease her quality of life. The patient has previously undergone conservative treatment including NSAIDS and injections to the knee without adequate control of her symptoms.  Denies significant cardiac history, history of blood clots, or history of lumbar surgery. Does report history of hives with penicillin use, but has not had any penicillin in many years.  Patient does note that she has difficulty swallowing large pills.  Past Medical, Surgical, Family, Social History, Allergies, Medications:   Past Medical History:  Past Medical History:  Diagnosis Date   Allergy   Facial rash   GERD (gastroesophageal reflux disease)   Hematuria   Premenstrual dysphoric disorder   Past Surgical History:  Past Surgical History:  Procedure Laterality Date   CHOLECYSTECTOMY   TONSILLECTOMY   Current Medications:  Current Outpatient Medications  Medication Sig Dispense Refill   acetaminophen (TYLENOL) 500 MG tablet Take 500 mg by mouth 2 (two) times daily as needed for Pain   celecoxib (CELEBREX) 100 MG  capsule Take 100 mg by mouth 2 (two) times daily   diclofenac (VOLTAREN) 1 % topical gel Apply 2 g topically 4 (four) times daily 200 g 3   fluticasone propionate (FLONASE) 50 mcg/actuation nasal spray Place 2 sprays into both nostrils once daily as needed   ibuprofen (MOTRIN) 200 MG tablet Take 400-600 mg by mouth 2 (two) times daily as needed for Pain   levonorgestrel-ethinyl estradiol (LESSINA) 0.1-20 mg-mcg tablet Take 1 tablet by mouth once daily   No current facility-administered medications for this visit.   Allergies:  Allergies  Allergen Reactions   Penicillins Rash, Hives and Other (See Comments)  Did it involve swelling of the face/tongue/throat, SOB, or low BP? No Did it involve sudden or severe rash/hives, skin peeling, or any reaction on the inside of your mouth or nose? No Did you need to seek medical attention at a hospital or doctor's office? No When did it last happen?     within the last 5 years  If all above answers are "NO", may proceed with cephalosporin use.   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   Occupation: Part-time Engineer, water  Tobacco Use   Smoking status: Former Smoker  Packs/day: 0.50  Years: 12.00  Pack years: 6.00  Quit date: 2004  Years since quitting: 18.4   Smokeless tobacco: Never Used  Vaping Use   Vaping Use: Never used  Substance and Sexual Activity   Alcohol use: No   Drug use: Never   Sexual activity: Yes  Partners: Male   Family History:  Family History  Problem Relation Age of Onset   Arthritis Mother   Raynaud syndrome Mother   Arthritis Father   Review of Systems:   A 10+ ROS was performed, reviewed, and the pertinent orthopaedic findings are documented in the HPI.    Physical Examination:   BP 136/86 (BP Location: Left upper arm, Patient Position: Sitting, BP Cuff Size: Large Adult)  Ht 154.9 cm (5\' 1" )  Wt 92.1 kg (203 lb)  BMI 38.36 kg/m   Patient is a well-developed, well-nourished female in no acute distress. Patient has normal mood and affect. Patient is alert and oriented to person, place, and time.   HEENT: Atraumatic, normocephalic. Pupils equal and reactive to light. Extraocular motion intact. Noninjected sclera.  Cardiovascular: Regular rate and rhythm, with no murmurs, rubs, or gallops. Distal pulses palpable.  Respiratory: Lungs clear to auscultation bilaterally.   Right Knee: Soft tissue swelling: none Effusion: none Erythema: none Crepitance: mild Tenderness: medial, anterior Alignment: relative varus Mediolateral laxity: medial pseudolaxity Posterior sag: negative Patellar tracking: Good tracking without evidence of subluxation or tilt Atrophy: No significant atrophy.  Quadriceps tone was good. Range of motion: 0/0/120 degrees  Sensation intact over the saphenous, lateral sural cutaneous, superficial fibular, and deep fibular nerve distributions.  Tests Performed/Reviewed:  X-rays  3 views of the right knee were reviewed. Images reveal severe loss of medial compartment joint space with significant osteophyte formation. No fractures or dislocations. No osseous abnormality noted.  Impression:   ICD-10-CM  1. Primary osteoarthritis of right knee M17.11   Plan:   The patient has end-stage degenerative changes of the right knee. It was explained to the patient that the condition is progressive in nature. Having failed conservative treatment, the patient has elected to proceed with a total joint arthroplasty. The patient will undergo a total joint arthroplasty with Dr. Marry Guan. The risks of surgery, including blood clot and infection, were discussed with the patient. Measures to reduce these risks, including the use of  anticoagulation, perioperative antibiotics, and early ambulation were discussed. The importance of postoperative physical therapy was discussed with the patient. The patient elects to proceed with surgery. The patient is instructed to stop all blood thinners prior to surgery. The patient is instructed to call the hospital the day before surgery to learn of the proper arrival time.   Contact our office with any questions or concerns. Follow up as indicated, or sooner should any new problems arise, if conditions worsen, or if they are otherwise concerned.   Gwenlyn Fudge, [facility] [place], [place] [address] Phone: [phone]  This note was generated in part with voice recognition software and I apologize for any typographical errors that were not detected and corrected.  Electronically signed by Gwenlyn Fudge, PA at 12/27/2020 7:16 PM EDT

## 2021-01-04 MED ORDER — MELATONIN 5 MG PO TABS
5.0000 mg | ORAL_TABLET | Freq: Every day | ORAL | Status: DC
  Administered 2021-01-04: 5 mg via ORAL
  Filled 2021-01-04: qty 1

## 2021-01-04 NOTE — Progress Notes (Signed)
Physical Therapy Treatment Patient Details Name: Sierra Moore MRN: [id number] DOB: 20-Feb-1971 Today's Date: 01/04/2021    History of Present Illness admitted for acute hospitalization s/p R TKR, WBAT (01/03/21)    PT Comments    Pt was long sitting in bed with bone foam and polar care applied. She greets Chief Strategy Officer and is extremely pleasant throughout session. Pt is A and O x 4 and eager to get OOB. Lengthy discussion about polar care/bone foam/ positioning at home at DC. She states understanding. Pt reports having BSC(3 in1) at home but will need RW. She was easily able to exit R side of bed, stand, and ambulate 160 ft total without LOB or unsteadiness. Performed ascending/descending stairs with BUE support + supervision. Will return this afternoon to progress ROM and strength. Recommend DC home with HHPT to continue to progress pt towards PLOF.    Follow Up Recommendations  Home health PT     Equipment Recommendations  Rolling walker with 5" wheels (pt has 3 in one)    Recommendations for Other Services       Precautions / Restrictions Precautions Precautions: Fall Restrictions Weight Bearing Restrictions: Yes RLE Weight Bearing: Weight bearing as tolerated    Mobility  Bed Mobility Overal bed mobility: Needs Assistance Bed Mobility: Supine to Sit     Supine to sit: HOB elevated;Supervision (used bed rail)     General bed mobility comments: Pt was able to exit R side of bed (HOB elevated) with supervision. Increased time to perform.    Transfers Overall transfer level: Needs assistance Equipment used: Rolling walker (2 wheeled) Transfers: Sit to/from Stand Sit to Stand: Supervision         General transfer comment: Pt was easily and safely able to stand from EOB.  Ambulation/Gait Ambulation/Gait assistance: Supervision Gait Distance (Feet): 160 Feet Assistive device: Rolling walker (2 wheeled) Gait Pattern/deviations: Step-through pattern;Trunk flexed Gait  velocity: WNL   General Gait Details: Pt was easily and safely able to ambulate 160 ft. no LOB however several standing rest breaks   Stairs Stairs: Yes Stairs assistance: Supervision Stair Management: Two rails;Step to pattern Number of Stairs: 4 General stair comments: Easily and safely able to ascend/descend 4 stair without difficulty or safety concerns          Cognition Arousal/Alertness: Awake/alert Behavior During Therapy: WFL for tasks assessed/performed Overall Cognitive Status: Within Functional Limits for tasks assessed      General Comments: Pt is A and O x 4. very pleasant and motivated. slightly anxious             Pertinent Vitals/Pain Pain Assessment: 0-10 Pain Score: 7  Pain Location: R knee Pain Descriptors / Indicators: Aching;Guarding;Grimacing Pain Intervention(s): Limited activity within patient's tolerance;Monitored during session;Premedicated before session;Repositioned;Ice applied     PT Goals (current goals can now be found in the care plan section) Acute Rehab PT Goals Patient Stated Goal: to return home Progress towards PT goals: Progressing toward goals    Frequency    BID      PT Plan Current plan remains appropriate       AM-PAC PT "6 Clicks" Mobility   Outcome Measure  Help needed turning from your back to your side while in a flat bed without using bedrails?: None Help needed moving from lying on your back to sitting on the side of a flat bed without using bedrails?: A Little Help needed moving to and from a bed to a chair (including a wheelchair)?: A Little  Help needed standing up from a chair using your arms (e.g., wheelchair or bedside chair)?: A Little Help needed to walk in hospital room?: A Little Help needed climbing 3-5 steps with a railing? : A Little 6 Click Score: 19    End of Session Equipment Utilized During Treatment: Gait belt Activity Tolerance: Patient tolerated treatment well Patient left: in  chair;with call bell/phone within reach;with chair alarm set Nurse Communication: Mobility status PT Visit Diagnosis: Other abnormalities of gait and mobility (R26.89);Pain Pain - Right/Left: Right Pain - part of body: Knee     Time: 1010-1048 PT Time Calculation (min) (ACUTE ONLY): 38 min  Charges:  $Gait Training: 23-37 mins $Therapeutic Activity: 8-22 mins                     Julaine Fusi PTA 01/04/21, 11:01 AM

## 2021-01-04 NOTE — Progress Notes (Signed)
Physical Therapy Treatment Patient Details Name: Sierra Moore MRN: [id number] DOB: December 12, 1970 Today's Date: 01/04/2021    History of Present Illness admitted for acute hospitalization s/p R TKR, WBAT (01/03/21)    PT Comments    Author returned for 2nd/ PM session. Pt was still in recliner and continues to be motivated and very pleasant. She Overall continues to be safe with transfers and gait. Session mostly focused on improving strength and ROM. Did demonstrate 0-92 degrees AROM. Pt will benefit form HHPT to continue to progress pt to PLOF. Pt is cleared from a PT prospective to safely DC home once cleared medically.    Follow Up Recommendations  Home health PT     Equipment Recommendations  Rolling walker with 5" wheels       Precautions / Restrictions Precautions Precautions: Fall Restrictions Weight Bearing Restrictions: Yes RLE Weight Bearing: Weight bearing as tolerated    Mobility  Bed Mobility Overal bed mobility: Needs Assistance      General bed mobility comments: in recliner pre/post session    Transfers Overall transfer level: Needs assistance Equipment used: Rolling walker (2 wheeled) Transfers: Sit to/from Stand Sit to Stand: Supervision         General transfer comment: Pt was easily and safely able to stand from recliner, occasional cues to push up from chair  Ambulation/Gait Ambulation/Gait assistance: Supervision Gait Distance (Feet): 50 Feet Assistive device: Rolling walker (2 wheeled)   Gait velocity: WNL   General Gait Details: pt ambulated greater distances in AM session. worked on Occupational psychologist Overall balance assessment: Needs assistance Sitting-balance support: No upper extremity supported;Feet supported Sitting balance-Leahy Scale: Good         Cognition Arousal/Alertness: Awake/alert Behavior During Therapy: WFL for tasks assessed/performed Overall Cognitive Status: Within Functional Limits for  tasks assessed      General Comments: Pt is A and O x 4. very pleasant and motivated      Exercises Total Joint Exercises Ankle Circles/Pumps: AROM Quad Sets: AROM Gluteal Sets: AROM Short Arc Quad: AROM Heel Slides: AROM Hip ABduction/ADduction: AROM Straight Leg Raises: AAROM        Pertinent Vitals/Pain Pain Assessment: 0-10 Pain Score: 4  Pain Location: R knee Pain Descriptors / Indicators: Aching;Guarding;Grimacing Pain Intervention(s): Limited activity within patient's tolerance;Monitored during session;Premedicated before session;Repositioned;Ice applied    Home Living Family/patient expects to be discharged to:: Private residence Living Arrangements: Spouse/significant other;Children Available Help at Discharge: Family Type of Home: House Home Access: Stairs to enter Entrance Stairs-Rails: Right;Left;Can reach both Home Layout: One level Home Equipment: None Additional Comments: Has a reacher at home    Prior Function Level of Independence: Independent      Comments: Indep with ADLs, household and community mobilization without assist device; working part-time as Gaffer   PT Goals (current goals can now be found in the care plan section) Acute Rehab PT Goals 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 Progress towards PT goals: Progressing toward goals    Frequency    BID      PT Plan Current plan remains appropriate    Co-evaluation              AM-PAC PT "6 Clicks" Mobility   Outcome Measure  Help needed turning from your back to your side while in a flat bed without using bedrails?: None Help needed moving from lying on your back to sitting on the side of a flat  bed without using bedrails?: None Help needed moving to and from a bed to a chair (including a wheelchair)?: A Little Help needed standing up from a chair using your arms (e.g., wheelchair or bedside chair)?: A Little Help needed to walk in hospital room?: A  Little Help needed climbing 3-5 steps with a railing? : A Little 6 Click Score: 20    End of Session Equipment Utilized During Treatment: Gait belt Activity Tolerance: Patient tolerated treatment well Patient left: in chair;with call bell/phone within reach;with chair alarm set Nurse Communication: Mobility status PT Visit Diagnosis: Other abnormalities of gait and mobility (R26.89);Pain Pain - Right/Left: Right Pain - part of body: Knee     Time: 6546-5035 PT Time Calculation (min) (ACUTE ONLY): 24 min  Charges:  $Gait Training: 8-22 mins $Therapeutic Exercise: 8-22 mins                    Julaine Fusi PTA 01/04/21, 4:47 PM

## 2021-01-04 NOTE — Progress Notes (Signed)
  Subjective: 1 Day Post-Op Procedure(s) (LRB): COMPUTER ASSISTED TOTAL KNEE ARTHROPLASTY - RNFA (Right) Patient reports pain as mild.   Patient is well, and has had no acute complaints or problems Plan is to go Home after hospital stay. Negative for chest pain and shortness of breath Fever: no Gastrointestinal:negative for nausea and vomiting Patient is passing gas.   Objective: Vital signs in last 24 hours: Temp:  [97.6 F (36.4 C)-99.3 F (37.4 C)] 97.6 F (36.4 C) (07/02 0446) Pulse Rate:  [79-91] 79 (07/02 0446) Resp:  [16-33] 18 (07/02 0446) BP: (106-146)/(56-104) 146/68 (07/02 0446) SpO2:  [93 %-100 %] 100 % (07/02 0446)  Intake/Output from previous day:  Intake/Output Summary (Last 24 hours) at 01/04/2021 0852 Last data filed at 01/04/2021 0704 Gross per 24 hour  Intake 2994.48 ml  Output 2210 ml  Net 784.48 ml    Intake/Output this shift: Total I/O In: -  Out: 210 [Drains:210]  Labs: No results for input(s): HGB in the last 72 hours. No results for input(s): WBC, RBC, HCT, PLT in the last 72 hours. No results for input(s): NA, K, CL, CO2, BUN, CREATININE, GLUCOSE, CALCIUM in the last 72 hours. No results for input(s): LABPT, INR in the last 72 hours.   EXAM General - Patient is Alert, Appropriate, and Oriented Extremity - ABD soft Dorsiflexion/Plantar flexion intact Bulky dressing intact to the right leg. Woundvac with mild bloody drainage. Mild decrease in sensation along the plantar aspect of the foot but able to flex and extend foot and toes without difficulty. Dressing/Incision - clean, dry, bulky dressing in place. Motor Function - intact, moving foot and toes well on exam.  Abdomen soft with normal bowel sounds. Negative homans to bilateral lower extremities.  Past Medical History:  Diagnosis Date   Allergy    Arthritis    POLYARTHRALGIA   GERD (gastroesophageal reflux disease)    occ   History of kidney stones    currently as of 12-25-20    Leg swelling    Obesity    PMDD (premenstrual dysphoric disorder)    PMDD (premenstrual dysphoric disorder)    Raynaud disease    Vitamin D deficiency     Assessment/Plan: 1 Day Post-Op Procedure(s) (LRB): COMPUTER ASSISTED TOTAL KNEE ARTHROPLASTY - RNFA (Right) Active Problems:   Total knee replacement status  Estimated body mass index is 37.79 kg/m as calculated from the following:   Height as of this encounter: 5\' 1"  (1.549 m).   Weight as of this encounter: 90.7 kg. Advance diet Up with therapy D/C IV fluids when tolerating po intake.  Vitals reviewed. Up with therapy today.  Encouraged increased oral intake. Add melatonin to help with sleep. Plan for bulky dressing removal and woundvac removal tomorrow. Plan for discharge home tomorrow with HHPT.  DVT Prophylaxis - Lovenox and Foot Pumps Weight-Bearing as tolerated to right leg  J. Cameron Proud, PA-C [facility] 01/04/2021, 8:52 AM

## 2021-01-04 NOTE — Anesthesia Postprocedure Evaluation (Signed)
Anesthesia Post Note  Patient: Sierra Moore  Procedure(s) Performed: COMPUTER ASSISTED TOTAL KNEE ARTHROPLASTY - RNFA (Right: Knee)  Patient location during evaluation: Nursing Unit Anesthesia Type: Spinal Level of consciousness: awake, oriented and awake and alert Pain management: satisfactory to patient Vital Signs Assessment: post-procedure vital signs reviewed and stable Respiratory status: spontaneous breathing, respiratory function stable and nonlabored ventilation Cardiovascular status: stable Postop Assessment: no headache, adequate PO intake, no backache and no apparent nausea or vomiting Anesthetic complications: no Comments: Doing well.    No notable events documented.   Last Vitals:  Vitals:   01/04/21 0446 01/04/21 0949  BP: (!) 146/68 (!) 134/59  Pulse: 79 90  Resp: 18 16  Temp: 36.4 C 36.5 C  SpO2: 100% 97%    Last Pain:  Vitals:   01/04/21 0446  TempSrc: Oral  PainSc:                  Deno Etienne

## 2021-01-04 NOTE — Evaluation (Signed)
Occupational Therapy Evaluation Patient Details Name: Sierra Moore MRN: [id number] DOB: 11-Jun-1971 Today's Date: 01/04/2021    History of Present Illness admitted for acute hospitalization s/p R TKR, WBAT (01/03/21)   Clinical Impression   Pt seen for OT evaluation and treatment this date.  Patient sitting in chair on arrival, reports it was good to get out of the bed earlier.  Mild pain in the knee with movement and activity.  Patient presents with decreased ROM, decreased strength, decreased transfers, functional mobility and decreased ability to perform self care tasks. She would benefit from skilled OT services to maximize her safety and independence in necessary daily tasks.  She was instructed on A/E to assist with lower body self care.  Overall min assist with lower body self care using reacher, sock aid and shoehorn.  Will need assist to tie shoes or consider elastic shoelaces.  Pt will have assist from her husband and daughter and will not require follow up OT at discharge.      Follow Up Recommendations  No OT follow up    Equipment Recommendations       Recommendations for Other Services       Precautions / Restrictions Precautions Precautions: Fall Restrictions Weight Bearing Restrictions: Yes RLE Weight Bearing: Weight bearing as tolerated      Mobility Bed Mobility Overal bed mobility: Needs Assistance Bed Mobility: Supine to Sit     Supine to sit: HOB elevated;Supervision (used bed rail)     General bed mobility comments: Pt was able to exit R side of bed (HOB elevated) with supervision. Increased time to perform.    Transfers Overall transfer level: Needs assistance Equipment used: Rolling walker (2 wheeled) Transfers: Sit to/from Stand Sit to Stand: Supervision         General transfer comment: Pt was easily and safely able to stand from recliner, occasional cues to push up from chair    Balance Overall balance assessment: Needs  assistance Sitting-balance support: No upper extremity supported;Feet supported Sitting balance-Leahy Scale: Good                                     ADL either performed or assessed with clinical judgement   ADL Overall ADL's : Needs assistance/impaired Eating/Feeding: Independent   Grooming: Standing;Supervision/safety   Upper Body Bathing: Minimal assistance;Set up   Lower Body Bathing: Set up;Moderate assistance;Minimal assistance   Upper Body Dressing : Independent   Lower Body Dressing: Minimal assistance Lower Body Dressing Details (indicate cue type and reason): Pt seen for LB dressing techniques with use of A/E.  Training provided for reacher, sock aid and shoehorn.  Donned and doffed underwear with reacher with cues and supervision, socks with sock aid with min assist, shoes with min assist, A/E and assist to tie. Toilet Transfer: Copy Details (indicate cue type and reason): assist to manage incision drain Toileting- Clothing Manipulation and Hygiene: Min guard       Functional mobility during ADLs: Surveyor, minerals     Praxis      Pertinent Vitals/Pain Pain Assessment: 0-10 Pain Score: 3  Pain Location: R knee Pain Descriptors / Indicators: Aching;Guarding;Grimacing Pain Intervention(s): Limited activity within patient's tolerance;Monitored during session;Repositioned     Hand Dominance Right   Extremity/Trunk Assessment Upper Extremity Assessment Upper Extremity Assessment: Overall  WFL for tasks assessed   Lower Extremity Assessment Lower Extremity Assessment: Overall WFL for tasks assessed   Cervical / Trunk Assessment Cervical / Trunk Assessment: Normal   Communication Communication Communication: No difficulties   Cognition Arousal/Alertness: Awake/alert Behavior During Therapy: WFL for tasks assessed/performed Overall Cognitive Status: Within Functional  Limits for tasks assessed                                 General Comments: Pt is A and O x 4. very pleasant and motivated   General Comments       Exercises     Shoulder Instructions      Home Living Family/patient expects to be discharged to:: Private residence Living Arrangements: Spouse/significant other;Children Available Help at Discharge: Family Type of Home: House Home Access: Stairs to enter Technical brewer of Steps: 3 Entrance Stairs-Rails: Right;Left;Can reach both Home Layout: One level     Bathroom Shower/Tub: Corporate investment banker: Handicapped height     Home Equipment: None   Additional Comments: Has a reacher at home      Prior Functioning/Environment Level of Independence: Independent        Comments: Indep with ADLs, household and community mobilization without assist device; working part-time as Gaffer        OT Problem List: Decreased strength;Decreased knowledge of use of DME or AE;Decreased range of motion;Decreased activity tolerance;Pain      OT Treatment/Interventions: Self-care/ADL training;Patient/family education;DME and/or AE instruction    OT Goals(Current goals can be found in the care plan section) Acute Rehab OT Goals Patient Stated Goal: to return home, to be as independent as possible OT Goal Formulation: With patient Time For Goal Achievement: 01/18/21 Potential to Achieve Goals: Good ADL Goals Pt Will Perform Lower Body Dressing: with modified independence Pt Will Transfer to Toilet: with modified independence  OT Frequency: Min 1X/week   Barriers to D/C:            Co-evaluation              AM-PAC OT "6 Clicks" Daily Activity     Outcome Measure Help from another person eating meals?: None Help from another person taking care of personal grooming?: A Little Help from another person toileting, which includes using toliet, bedpan, or urinal?: A Little Help  from another person bathing (including washing, rinsing, drying)?: A Little Help from another person to put on and taking off regular upper body clothing?: None Help from another person to put on and taking off regular lower body clothing?: A Little 6 Click Score: 20   End of Session Equipment Utilized During Treatment: Gait belt;Rolling walker  Activity Tolerance: Patient tolerated treatment well Patient left: in chair;with call bell/phone within reach  OT Visit Diagnosis: Unsteadiness on feet (R26.81);Muscle weakness (generalized) (M62.81);Pain Pain - Right/Left: Right Pain - part of body: Knee                Time: 7622-6333 OT Time Calculation (min): 47 min Charges:  OT General Charges $OT Visit: 1 Visit OT Evaluation $OT Eval Low Complexity: 1 Low OT Treatments $Self Care/Home Management : 23-37 mins  Jerry Haugen T Marvin Grabill, OTR/L, CLT   Jeter Tomey 01/04/2021, 2:27 PM

## 2021-01-05 ENCOUNTER — Encounter: Payer: Self-pay | Admitting: Orthopedic Surgery

## 2021-01-05 MED ORDER — TRAMADOL HCL 50 MG PO TABS: 50.0000 mg | ORAL_TABLET | ORAL | 0 refills | Status: DC | PRN

## 2021-01-05 MED ORDER — OXYCODONE HCL 5 MG PO TABS: 5.0000 mg | ORAL_TABLET | ORAL | 0 refills | Status: DC | PRN

## 2021-01-05 MED ORDER — ONDANSETRON HCL 4 MG PO TABS: 4.0000 mg | ORAL_TABLET | Freq: Four times a day (QID) | ORAL | 0 refills | Status: DC | PRN

## 2021-01-05 MED ORDER — ENOXAPARIN SODIUM 40 MG/0.4ML IJ SOSY: 40.0000 mg | PREFILLED_SYRINGE | INTRAMUSCULAR | 0 refills | Status: DC

## 2021-01-05 NOTE — Progress Notes (Signed)
D/c education provided to daughter and Pt. All questions answered. Pt has polar care, TEDs, and RW. Pt given hard script for oxy, tramadol, zofran, and lovanox. Daughter and pt educated on how to give lovanox. Pt given extra honeycomb dressing. Pt aware of appointments and when meds are to be given/able to be given. Pt daughter collected belongings and took to car. Pt transported by NT in [facility] to daughter's car. Pt has all belongings

## 2021-01-05 NOTE — Progress Notes (Signed)
Physical Therapy Treatment Patient Details Name: Sierra Moore MRN: [id number] DOB: [birth year] Today's Date: 01/05/2021    History of Present Illness admitted for acute hospitalization s/p R TKR, WBAT (01/03/21)    PT Comments    Completed lap and stair training with ease.  Emphasis on education for knee flexion and home safety.  No further questions or concerns at this time.   Follow Up Recommendations  Home health PT     Equipment Recommendations  Rolling walker with 5" wheels    Recommendations for Other Services       Precautions / Restrictions Precautions Precautions: Fall Restrictions Weight Bearing Restrictions: Yes RLE Weight Bearing: Weight bearing as tolerated    Mobility  Bed Mobility Overal bed mobility: Needs Assistance Bed Mobility: Supine to Sit     Supine to sit: HOB elevated;Supervision          Transfers Overall transfer level: Needs assistance Equipment used: Rolling walker (2 wheeled) Transfers: Sit to/from Stand Sit to Stand: Supervision;Modified independent (Device/Increase time)            Ambulation/Gait Ambulation/Gait assistance: Supervision Gait Distance (Feet): 160 Feet Assistive device: Rolling walker (2 wheeled) Gait Pattern/deviations: Step-through pattern;Trunk flexed Gait velocity: WNL   General Gait Details: cues to stand upright and education for walker position   Stairs   Stairs assistance: Supervision Stair Management: Two rails;Step to pattern Number of Stairs: 4 General stair comments: Easily and safely able to ascend/descend 4 stair without difficulty or safety concerns   Wheelchair Mobility    Modified Rankin (Stroke Patients Only)       Balance Overall balance assessment: Needs assistance Sitting-balance support: No upper extremity supported;Feet supported Sitting balance-Leahy Scale: Good     Standing balance support: Bilateral upper extremity supported Standing balance-Leahy Scale: Good                               Cognition Arousal/Alertness: Awake/alert Behavior During Therapy: WFL for tasks assessed/performed Overall Cognitive Status: Within Functional Limits for tasks assessed                                 General Comments: Pt is A and O x 4. very pleasant and motivated      Exercises Total Joint Exercises Goniometric ROM: 2-78 Other Exercises Other Exercises: seated ex with emphasis and education on knee flexion    General Comments        Pertinent Vitals/Pain Pain Assessment: 0-10 Pain Score: 7  Pain Location: R knee Pain Descriptors / Indicators: Aching;Guarding;Grimacing Pain Intervention(s): Repositioned;Ice applied;Patient requesting pain meds-RN notified    Home Living                      Prior Function            PT Goals (current goals can now be found in the care plan section) Progress towards PT goals: Progressing toward goals    Frequency    BID      PT Plan Current plan remains appropriate    Co-evaluation              AM-PAC PT "6 Clicks" Mobility   Outcome Measure  Help needed turning from your back to your side while in a flat bed without using bedrails?: None Help needed moving from lying on your back to sitting on the  side of a flat bed without using bedrails?: None Help needed moving to and from a bed to a chair (including a wheelchair)?: A Little Help needed standing up from a chair using your arms (e.g., wheelchair or bedside chair)?: A Little Help needed to walk in hospital room?: A Little Help needed climbing 3-5 steps with a railing? : A Little 6 Click Score: 20    End of Session Equipment Utilized During Treatment: Gait belt Activity Tolerance: Patient tolerated treatment well Patient left: in chair;with call bell/phone within reach;with chair alarm set Nurse Communication: Mobility status PT Visit Diagnosis: Other abnormalities of gait and mobility  (R26.89);Pain Pain - Right/Left: Right Pain - part of body: Knee     Time: 4825-0037 PT Time Calculation (min) (ACUTE ONLY): 25 min  Charges:  $Gait Training: 8-22 mins $Therapeutic Exercise: 8-22 mins                    Chesley Noon, PTA 01/05/21, 10:32 AM , 10:30 AM

## 2021-01-05 NOTE — Progress Notes (Signed)
  Subjective: 2 Days Post-Op Procedure(s) (LRB): COMPUTER ASSISTED TOTAL KNEE ARTHROPLASTY - RNFA (Right) Patient reports pain as mild.   Patient is well, and has had no acute complaints or problems Plan is to go Home after hospital stay. Negative for chest pain and shortness of breath Fever: no Gastrointestinal:negative for nausea and vomiting Patient is passing gas and has had a BM. Patient has cleared all task with PT.   Objective: Vital signs in last 24 hours: Temp:  [97.8 F (36.6 C)-98.4 F (36.9 C)] 98 F (36.7 C) (07/03 0759) Pulse Rate:  [85-97] 97 (07/03 0759) Resp:  [16-18] 18 (07/03 0759) BP: (134-151)/(68-83) 145/75 (07/03 0759) SpO2:  [98 %-100 %] 100 % (07/03 0759)  Intake/Output from previous day:  Intake/Output Summary (Last 24 hours) at 01/05/2021 1130 Last data filed at 01/04/2021 1955 Gross per 24 hour  Intake 1029.28 ml  Output 200 ml  Net 829.28 ml    Intake/Output this shift: No intake/output data recorded.  Labs: No results for input(s): HGB in the last 72 hours. No results for input(s): WBC, RBC, HCT, PLT in the last 72 hours. No results for input(s): NA, K, CL, CO2, BUN, CREATININE, GLUCOSE, CALCIUM in the last 72 hours. No results for input(s): LABPT, INR in the last 72 hours.   EXAM General - Patient is Alert, Appropriate, and Oriented Extremity - ABD soft Sensation intact distally Intact pulses distally Dorsiflexion/Plantar flexion intact Bulky dressing removed, hemovac removed today. Fresh honeycomb dressing applied to the right leg. Dressing/Incision - Clean honeycomb intact to the right leg. Motor Function - intact, moving foot and toes well on exam.  Abdomen soft with normal bowel sounds. Negative homans to bilateral lower extremities.  Past Medical History:  Diagnosis Date   Allergy    Arthritis    POLYARTHRALGIA   GERD (gastroesophageal reflux disease)    occ   History of kidney stones    currently as of 12-25-20   Leg  swelling    Obesity    PMDD (premenstrual dysphoric disorder)    PMDD (premenstrual dysphoric disorder)    Raynaud disease    Vitamin D deficiency     Assessment/Plan: 2 Days Post-Op Procedure(s) (LRB): COMPUTER ASSISTED TOTAL KNEE ARTHROPLASTY - RNFA (Right) Active Problems:   Total knee replacement status  Estimated body mass index is 37.79 kg/m as calculated from the following:   Height as of this encounter: 5\' 1"  (1.549 m).   Weight as of this encounter: 90.7 kg. Discharge home with home health   Vitals reviewed. Patient has had a BM. Intact to light touch to the right leg. Cleared all needed tasks with PT. Discharge home today with HHPT.  DVT Prophylaxis - Lovenox and Foot Pumps Weight-Bearing as tolerated to right leg  J. Cameron Proud, PA-C [facility] Orthopaedic Surgery 01/05/2021, 11:30 AM

## 2021-01-05 NOTE — TOC Progression Note (Signed)
Transition of Care [facility]) - Progression Note    Patient Details  Name: Sierra Moore MRN: [id number] Date of Birth: Feb 06, 1971  [facility] CM/SW Contact  Izola Price, RN Phone Number: 01/05/2021, 11:03 AM  Clinical Narrative: Confirmed HH services with [facility] via [facility] today. [employer] to deliver [employer] DME. Patient has own 3:1. Simmie Davies RN CM          Expected Discharge Plan and Services                                                 Social Determinants of Health (SDOH) Interventions    Readmission Risk Interventions No flowsheet data found.

## 2021-01-05 NOTE — Discharge Summary (Signed)
Physician Discharge Summary  Patient ID: Sierra Moore MRN: [id number] DOB/AGE: 1970-09-19 50 y.o.  Admit date: 01/03/2021 Discharge date: 01/05/2021  Admission Diagnoses:  Total knee replacement status [Z96.659]  Discharge Diagnoses: Patient Active Problem List   Diagnosis Date Noted   Total knee replacement status 01/03/2021   Marital problem 12/30/2020   Primary osteoarthritis of both knees 05/09/2019   Special screening for malignant neoplasms, colon    Polyp of descending colon    Other chest pain    Onychomycosis of toenail 12/27/2018   Polyarthralgia 12/27/2018   HLD (hyperlipidemia) 12/21/2018   Right foot pain 12/07/2018   Eczema 03/04/2017   Raynaud's phenomenon without gangrene 03/04/2017   Dysuria 09/25/2016   Mixed stress and urge urinary incontinence 09/25/2016   Mild obesity 03/04/2016   Seasonal allergies 02/10/2016   Encounter for routine adult physical exam with abnormal findings 02/10/2016   Heat intolerance 02/10/2016   Hematuria 02/10/2016   Vitamin D deficiency 10/25/2015   DM (diabetes mellitus) ([place]) 10/25/2015   IBS (irritable bowel syndrome) 10/24/2015    Past Medical History:  Diagnosis Date   Allergy    Arthritis    POLYARTHRALGIA   GERD (gastroesophageal reflux disease)    occ   History of kidney stones    currently as of 12-25-20   Leg swelling    Obesity    PMDD (premenstrual dysphoric disorder)    PMDD (premenstrual dysphoric disorder)    Raynaud disease    Vitamin D deficiency      Transfusion: None.   Consultants (if any):   Discharged Condition: Improved  Hospital Course: Sierra Moore is an 50 y.o. female who was admitted 01/03/2021 with a diagnosis of  degenerative arthrosis of the right knee and went to the operating room on 01/03/2021 and underwent the above named procedures.    Surgeries: Procedure(s): COMPUTER ASSISTED TOTAL KNEE ARTHROPLASTY - RNFA on 01/03/2021 Patient tolerated the surgery well. Taken to PACU  where she was stabilized and then transferred to the orthopedic floor.  Started on Lovenox 30mg  q 12 hrs. Foot pumps applied bilaterally at 80 mm. Heels elevated on bed with rolled towels. No evidence of DVT. Negative Homan. Physical therapy started on day #1 for gait training and transfer. OT started day #1 for ADL and assisted devices.  Patient's IV was removed on POD2, foley removed on POD2 and Hemovac was removed on POD2.  Implants: DePuy Attune size 4N posterior stabilized femoral component (cemented), size 3 rotating platform tibial component (cemented), 32 mm medialized dome patella (cemented), and a 5 mm stabilized rotating platform polyethylene insert.  She was given perioperative antibiotics:  Anti-infectives (From admission, onward)    Start     Dose/Rate Route Frequency Ordered Stop   01/03/21 1400  ceFAZolin (ANCEF) IVPB 2g/100 mL premix        2 g 200 mL/hr over 30 Minutes Intravenous Every 6 hours 01/03/21 1345 01/03/21 2131   01/03/21 0631  ceFAZolin (ANCEF) 2-4 GM/100ML-% IVPB       Note to Pharmacy: Maryagnes Amos   : cabinet override      01/03/21 0631 01/03/21 1524   01/03/21 0600  ceFAZolin (ANCEF) IVPB 2g/100 mL premix        2 g 200 mL/hr over 30 Minutes Intravenous On call to O.R. 01/03/21 0216 01/03/21 6433     .  She was given sequential compression devices, early ambulation, and Lovenox for DVT prophylaxis.  She benefited maximally from the hospital stay and  there were no complications.    Recent vital signs:  Vitals:   01/05/21 0620 01/05/21 0759  BP: (!) 144/68 (!) 145/75  Pulse: 85 97  Resp: 18 18  Temp: 98.4 F (36.9 C) 98 F (36.7 C)  SpO2: 98% 100%    Recent laboratory studies:  Lab Results  Component Value Date   HGB 13.9 12/25/2020   HGB 14.4 03/09/2019   HGB 14.4 12/23/2018   Lab Results  Component Value Date   WBC 13.9 (H) 12/25/2020   PLT 414 (H) 12/25/2020   Lab Results  Component Value Date   INR 1.0 12/25/2020   Lab  Results  Component Value Date   NA 138 12/25/2020   K 4.2 12/25/2020   CL 106 12/25/2020   CO2 25 12/25/2020   BUN 15 12/25/2020   CREATININE 0.81 12/25/2020   GLUCOSE 109 (H) 12/25/2020    Discharge Medications:   Allergies as of 01/05/2021       Reactions   Penicillins Hives   IgE = 16 (WNL) on 12/25/2020 Did it involve swelling of the face/tongue/throat, SOB, or low BP? No Did it involve sudden or severe rash/hives, skin peeling, or any reaction on the inside of your mouth or nose? No Did you need to seek medical attention at a hospital or doctor's office? No When did it last happen?     within the last 5 years  If all above answers are "NO", may proceed with cephalosporin use.        Medication List     STOP taking these medications    ibuprofen 200 MG tablet Commonly known as: ADVIL       TAKE these medications    acetaminophen 500 MG tablet Commonly known as: TYLENOL Take 500 mg by mouth every 6 (six) hours as needed (for pain).   celecoxib 100 MG capsule Commonly known as: CELEBREX Take 100 mg by mouth 2 (two) times daily as needed (pain/inflammation).   diclofenac Sodium 1 % Gel Commonly known as: VOLTAREN Apply 2 g topically 4 (four) times daily as needed (pain.).   enoxaparin 40 MG/0.4ML injection Commonly known as: LOVENOX Inject 0.4 mLs (40 mg total) into the skin daily.   fluticasone 50 MCG/ACT nasal spray Commonly known as: FLONASE Place 1 spray into both nostrils daily as needed (allergies).   omeprazole 20 MG tablet Commonly known as: PRILOSEC OTC Take 20 mg by mouth as needed.   ondansetron 4 MG tablet Commonly known as: ZOFRAN Take 1 tablet (4 mg total) by mouth every 6 (six) hours as needed for nausea.   oxyCODONE 5 MG immediate release tablet Commonly known as: Oxy IR/ROXICODONE Take 1-2 tablets (5-10 mg total) by mouth every 4 (four) hours as needed for moderate pain (pain score 4-6).   traMADol 50 MG tablet Commonly known as:  ULTRAM Take 1-2 tablets (50-100 mg total) by mouth every 4 (four) hours as needed for moderate pain.       ASK your doctor about these medications    LESSINA-28 0.1-20 MG-MCG tablet Generic drug: levonorgestrel-ethinyl estradiol Take 1 tablet by mouth daily.               Durable Medical Equipment  (From admission, onward)           Start     Ordered   01/03/21 1346  DME Walker rolling  Once       Question:  Patient needs a walker to treat with the following condition  Answer:  Total knee replacement status   01/03/21 1345   01/03/21 1346  DME Bedside commode  Once       Question:  Patient needs a bedside commode to treat with the following condition  Answer:  Total knee replacement status   01/03/21 1345            Diagnostic Studies: DG Knee Right Port  Result Date: 01/03/2021 CLINICAL DATA:  Postop right knee total arthroplasty EXAM: PORTABLE RIGHT KNEE - 1-2 VIEW COMPARISON:  None. FINDINGS: Status post right knee total arthroplasty with expected overlying postoperative change. No evidence of perihardware fracture or component malposition. Evidence of prior osteotomy of the distal tibia and fibula. IMPRESSION: Status post right knee total arthroplasty with expected overlying postoperative change. No evidence of perihardware fracture or component malposition. Electronically Signed   By: Eddie Candle M.D.   On: 01/03/2021 12:56    Disposition: Discharge home today with HHPT.    Follow-up Information     Fausto Skillern, PA-C Follow up on 01/17/2021.   Specialty: Orthopedic Surgery Why: at 1:15pm Contact information: [place] [address] [phone]         Dereck Leep, MD Follow up on 02/18/2021.   Specialty: Orthopedic Surgery Why: at 2:30pm Contact information: [place] [address] [phone]                 Signed: Judson Roch PA-C 01/05/2021, 11:32 AM

## 2021-01-06 DIAGNOSIS — Z87891 Personal history of nicotine dependence: Secondary | ICD-10-CM | POA: Diagnosis not present

## 2021-01-06 DIAGNOSIS — Z7901 Long term (current) use of anticoagulants: Secondary | ICD-10-CM | POA: Diagnosis not present

## 2021-01-06 DIAGNOSIS — Z96651 Presence of right artificial knee joint: Secondary | ICD-10-CM | POA: Diagnosis not present

## 2021-01-06 DIAGNOSIS — I7300 Raynaud's syndrome without gangrene: Secondary | ICD-10-CM | POA: Diagnosis not present

## 2021-01-06 DIAGNOSIS — Z87442 Personal history of urinary calculi: Secondary | ICD-10-CM | POA: Diagnosis not present

## 2021-01-06 DIAGNOSIS — K219 Gastro-esophageal reflux disease without esophagitis: Secondary | ICD-10-CM | POA: Diagnosis not present

## 2021-01-06 DIAGNOSIS — F3281 Premenstrual dysphoric disorder: Secondary | ICD-10-CM | POA: Diagnosis not present

## 2021-01-06 DIAGNOSIS — Z471 Aftercare following joint replacement surgery: Secondary | ICD-10-CM | POA: Diagnosis not present

## 2021-01-06 DIAGNOSIS — E669 Obesity, unspecified: Secondary | ICD-10-CM | POA: Diagnosis not present

## 2021-01-06 DIAGNOSIS — Z6837 Body mass index (BMI) 37.0-37.9, adult: Secondary | ICD-10-CM | POA: Diagnosis not present

## 2021-01-07 DIAGNOSIS — Z96651 Presence of right artificial knee joint: Secondary | ICD-10-CM | POA: Diagnosis not present

## 2021-01-08 DIAGNOSIS — Z6837 Body mass index (BMI) 37.0-37.9, adult: Secondary | ICD-10-CM | POA: Diagnosis not present

## 2021-01-08 DIAGNOSIS — E669 Obesity, unspecified: Secondary | ICD-10-CM | POA: Diagnosis not present

## 2021-01-08 DIAGNOSIS — K219 Gastro-esophageal reflux disease without esophagitis: Secondary | ICD-10-CM | POA: Diagnosis not present

## 2021-01-08 DIAGNOSIS — Z7901 Long term (current) use of anticoagulants: Secondary | ICD-10-CM | POA: Diagnosis not present

## 2021-01-08 DIAGNOSIS — Z96651 Presence of right artificial knee joint: Secondary | ICD-10-CM | POA: Diagnosis not present

## 2021-01-08 DIAGNOSIS — Z87891 Personal history of nicotine dependence: Secondary | ICD-10-CM | POA: Diagnosis not present

## 2021-01-08 DIAGNOSIS — F3281 Premenstrual dysphoric disorder: Secondary | ICD-10-CM | POA: Diagnosis not present

## 2021-01-08 DIAGNOSIS — I7300 Raynaud's syndrome without gangrene: Secondary | ICD-10-CM | POA: Diagnosis not present

## 2021-01-08 DIAGNOSIS — Z87442 Personal history of urinary calculi: Secondary | ICD-10-CM | POA: Diagnosis not present

## 2021-01-08 DIAGNOSIS — Z471 Aftercare following joint replacement surgery: Secondary | ICD-10-CM | POA: Diagnosis not present

## 2021-01-10 DIAGNOSIS — Z96651 Presence of right artificial knee joint: Secondary | ICD-10-CM | POA: Diagnosis not present

## 2021-01-10 DIAGNOSIS — Z6837 Body mass index (BMI) 37.0-37.9, adult: Secondary | ICD-10-CM | POA: Diagnosis not present

## 2021-01-10 DIAGNOSIS — Z87442 Personal history of urinary calculi: Secondary | ICD-10-CM | POA: Diagnosis not present

## 2021-01-10 DIAGNOSIS — K219 Gastro-esophageal reflux disease without esophagitis: Secondary | ICD-10-CM | POA: Diagnosis not present

## 2021-01-10 DIAGNOSIS — F3281 Premenstrual dysphoric disorder: Secondary | ICD-10-CM | POA: Diagnosis not present

## 2021-01-10 DIAGNOSIS — Z7901 Long term (current) use of anticoagulants: Secondary | ICD-10-CM | POA: Diagnosis not present

## 2021-01-10 DIAGNOSIS — E669 Obesity, unspecified: Secondary | ICD-10-CM | POA: Diagnosis not present

## 2021-01-10 DIAGNOSIS — Z471 Aftercare following joint replacement surgery: Secondary | ICD-10-CM | POA: Diagnosis not present

## 2021-01-10 DIAGNOSIS — I7300 Raynaud's syndrome without gangrene: Secondary | ICD-10-CM | POA: Diagnosis not present

## 2021-01-10 DIAGNOSIS — Z87891 Personal history of nicotine dependence: Secondary | ICD-10-CM | POA: Diagnosis not present

## 2021-01-13 DIAGNOSIS — Z87442 Personal history of urinary calculi: Secondary | ICD-10-CM | POA: Diagnosis not present

## 2021-01-13 DIAGNOSIS — E669 Obesity, unspecified: Secondary | ICD-10-CM | POA: Diagnosis not present

## 2021-01-13 DIAGNOSIS — K219 Gastro-esophageal reflux disease without esophagitis: Secondary | ICD-10-CM | POA: Diagnosis not present

## 2021-01-13 DIAGNOSIS — Z87891 Personal history of nicotine dependence: Secondary | ICD-10-CM | POA: Diagnosis not present

## 2021-01-13 DIAGNOSIS — I7300 Raynaud's syndrome without gangrene: Secondary | ICD-10-CM | POA: Diagnosis not present

## 2021-01-13 DIAGNOSIS — Z6837 Body mass index (BMI) 37.0-37.9, adult: Secondary | ICD-10-CM | POA: Diagnosis not present

## 2021-01-13 DIAGNOSIS — F3281 Premenstrual dysphoric disorder: Secondary | ICD-10-CM | POA: Diagnosis not present

## 2021-01-13 DIAGNOSIS — Z7901 Long term (current) use of anticoagulants: Secondary | ICD-10-CM | POA: Diagnosis not present

## 2021-01-13 DIAGNOSIS — Z96651 Presence of right artificial knee joint: Secondary | ICD-10-CM | POA: Diagnosis not present

## 2021-01-13 DIAGNOSIS — Z471 Aftercare following joint replacement surgery: Secondary | ICD-10-CM | POA: Diagnosis not present

## 2021-01-15 DIAGNOSIS — E669 Obesity, unspecified: Secondary | ICD-10-CM | POA: Diagnosis not present

## 2021-01-15 DIAGNOSIS — Z471 Aftercare following joint replacement surgery: Secondary | ICD-10-CM | POA: Diagnosis not present

## 2021-01-15 DIAGNOSIS — Z7901 Long term (current) use of anticoagulants: Secondary | ICD-10-CM | POA: Diagnosis not present

## 2021-01-15 DIAGNOSIS — Z96651 Presence of right artificial knee joint: Secondary | ICD-10-CM | POA: Diagnosis not present

## 2021-01-15 DIAGNOSIS — I7300 Raynaud's syndrome without gangrene: Secondary | ICD-10-CM | POA: Diagnosis not present

## 2021-01-15 DIAGNOSIS — K219 Gastro-esophageal reflux disease without esophagitis: Secondary | ICD-10-CM | POA: Diagnosis not present

## 2021-01-15 DIAGNOSIS — Z87891 Personal history of nicotine dependence: Secondary | ICD-10-CM | POA: Diagnosis not present

## 2021-01-15 DIAGNOSIS — Z6837 Body mass index (BMI) 37.0-37.9, adult: Secondary | ICD-10-CM | POA: Diagnosis not present

## 2021-01-15 DIAGNOSIS — Z87442 Personal history of urinary calculi: Secondary | ICD-10-CM | POA: Diagnosis not present

## 2021-01-15 DIAGNOSIS — F3281 Premenstrual dysphoric disorder: Secondary | ICD-10-CM | POA: Diagnosis not present

## 2021-01-17 DIAGNOSIS — M6281 Muscle weakness (generalized): Secondary | ICD-10-CM | POA: Diagnosis not present

## 2021-01-17 DIAGNOSIS — Z96651 Presence of right artificial knee joint: Secondary | ICD-10-CM | POA: Diagnosis not present

## 2021-01-17 DIAGNOSIS — M25561 Pain in right knee: Secondary | ICD-10-CM | POA: Diagnosis not present

## 2021-01-17 DIAGNOSIS — M25661 Stiffness of right knee, not elsewhere classified: Secondary | ICD-10-CM | POA: Diagnosis not present

## 2021-01-20 DIAGNOSIS — M6281 Muscle weakness (generalized): Secondary | ICD-10-CM | POA: Diagnosis not present

## 2021-01-20 DIAGNOSIS — M25661 Stiffness of right knee, not elsewhere classified: Secondary | ICD-10-CM | POA: Diagnosis not present

## 2021-01-20 DIAGNOSIS — M25561 Pain in right knee: Secondary | ICD-10-CM | POA: Diagnosis not present

## 2021-01-20 DIAGNOSIS — Z96651 Presence of right artificial knee joint: Secondary | ICD-10-CM | POA: Diagnosis not present

## 2021-01-22 DIAGNOSIS — M25661 Stiffness of right knee, not elsewhere classified: Secondary | ICD-10-CM | POA: Diagnosis not present

## 2021-01-22 DIAGNOSIS — M25561 Pain in right knee: Secondary | ICD-10-CM | POA: Diagnosis not present

## 2021-01-22 DIAGNOSIS — Z96651 Presence of right artificial knee joint: Secondary | ICD-10-CM | POA: Diagnosis not present

## 2021-01-22 DIAGNOSIS — M6281 Muscle weakness (generalized): Secondary | ICD-10-CM | POA: Diagnosis not present

## 2021-01-24 DIAGNOSIS — M25561 Pain in right knee: Secondary | ICD-10-CM | POA: Diagnosis not present

## 2021-01-24 DIAGNOSIS — M6281 Muscle weakness (generalized): Secondary | ICD-10-CM | POA: Diagnosis not present

## 2021-01-24 DIAGNOSIS — M25661 Stiffness of right knee, not elsewhere classified: Secondary | ICD-10-CM | POA: Diagnosis not present

## 2021-01-24 DIAGNOSIS — Z96651 Presence of right artificial knee joint: Secondary | ICD-10-CM | POA: Diagnosis not present

## 2021-01-28 DIAGNOSIS — M25561 Pain in right knee: Secondary | ICD-10-CM | POA: Diagnosis not present

## 2021-01-28 DIAGNOSIS — M25661 Stiffness of right knee, not elsewhere classified: Secondary | ICD-10-CM | POA: Diagnosis not present

## 2021-01-28 DIAGNOSIS — M6281 Muscle weakness (generalized): Secondary | ICD-10-CM | POA: Diagnosis not present

## 2021-01-28 DIAGNOSIS — Z96651 Presence of right artificial knee joint: Secondary | ICD-10-CM | POA: Diagnosis not present

## 2021-01-30 ENCOUNTER — Other Ambulatory Visit: Payer: Federal, State, Local not specified - PPO

## 2021-01-30 DIAGNOSIS — Z96651 Presence of right artificial knee joint: Secondary | ICD-10-CM | POA: Diagnosis not present

## 2021-02-03 DIAGNOSIS — M6281 Muscle weakness (generalized): Secondary | ICD-10-CM | POA: Diagnosis not present

## 2021-02-03 DIAGNOSIS — M25561 Pain in right knee: Secondary | ICD-10-CM | POA: Diagnosis not present

## 2021-02-03 DIAGNOSIS — M25661 Stiffness of right knee, not elsewhere classified: Secondary | ICD-10-CM | POA: Diagnosis not present

## 2021-02-03 DIAGNOSIS — Z96651 Presence of right artificial knee joint: Secondary | ICD-10-CM | POA: Diagnosis not present

## 2021-02-05 DIAGNOSIS — M25561 Pain in right knee: Secondary | ICD-10-CM | POA: Diagnosis not present

## 2021-02-05 DIAGNOSIS — M6281 Muscle weakness (generalized): Secondary | ICD-10-CM | POA: Diagnosis not present

## 2021-02-05 DIAGNOSIS — Z96651 Presence of right artificial knee joint: Secondary | ICD-10-CM | POA: Diagnosis not present

## 2021-02-05 DIAGNOSIS — M25661 Stiffness of right knee, not elsewhere classified: Secondary | ICD-10-CM | POA: Diagnosis not present

## 2021-02-11 DIAGNOSIS — Z96651 Presence of right artificial knee joint: Secondary | ICD-10-CM | POA: Diagnosis not present

## 2021-02-13 DIAGNOSIS — Z96651 Presence of right artificial knee joint: Secondary | ICD-10-CM | POA: Diagnosis not present

## 2021-02-13 DIAGNOSIS — M25561 Pain in right knee: Secondary | ICD-10-CM | POA: Diagnosis not present

## 2021-02-18 DIAGNOSIS — Z96651 Presence of right artificial knee joint: Secondary | ICD-10-CM | POA: Diagnosis not present

## 2021-02-18 DIAGNOSIS — M25561 Pain in right knee: Secondary | ICD-10-CM | POA: Diagnosis not present

## 2021-02-24 DIAGNOSIS — F418 Other specified anxiety disorders: Secondary | ICD-10-CM | POA: Diagnosis not present

## 2021-02-25 DIAGNOSIS — M25561 Pain in right knee: Secondary | ICD-10-CM | POA: Diagnosis not present

## 2021-02-25 DIAGNOSIS — Z96651 Presence of right artificial knee joint: Secondary | ICD-10-CM | POA: Diagnosis not present

## 2021-02-25 DIAGNOSIS — M25661 Stiffness of right knee, not elsewhere classified: Secondary | ICD-10-CM | POA: Diagnosis not present

## 2021-02-25 DIAGNOSIS — M6281 Muscle weakness (generalized): Secondary | ICD-10-CM | POA: Diagnosis not present

## 2021-02-27 DIAGNOSIS — Z96651 Presence of right artificial knee joint: Secondary | ICD-10-CM | POA: Diagnosis not present

## 2021-03-04 DIAGNOSIS — M25661 Stiffness of right knee, not elsewhere classified: Secondary | ICD-10-CM | POA: Diagnosis not present

## 2021-03-04 DIAGNOSIS — Z96651 Presence of right artificial knee joint: Secondary | ICD-10-CM | POA: Diagnosis not present

## 2021-03-04 DIAGNOSIS — M25561 Pain in right knee: Secondary | ICD-10-CM | POA: Diagnosis not present

## 2021-03-04 DIAGNOSIS — M6281 Muscle weakness (generalized): Secondary | ICD-10-CM | POA: Diagnosis not present

## 2021-03-06 DIAGNOSIS — Z96651 Presence of right artificial knee joint: Secondary | ICD-10-CM | POA: Diagnosis not present

## 2021-03-06 DIAGNOSIS — M25661 Stiffness of right knee, not elsewhere classified: Secondary | ICD-10-CM | POA: Diagnosis not present

## 2021-03-06 DIAGNOSIS — M25561 Pain in right knee: Secondary | ICD-10-CM | POA: Diagnosis not present

## 2021-03-06 DIAGNOSIS — M6281 Muscle weakness (generalized): Secondary | ICD-10-CM | POA: Diagnosis not present

## 2021-03-07 DIAGNOSIS — Z471 Aftercare following joint replacement surgery: Secondary | ICD-10-CM | POA: Diagnosis not present

## 2021-03-11 DIAGNOSIS — M25661 Stiffness of right knee, not elsewhere classified: Secondary | ICD-10-CM | POA: Diagnosis not present

## 2021-03-11 DIAGNOSIS — M25561 Pain in right knee: Secondary | ICD-10-CM | POA: Diagnosis not present

## 2021-03-11 DIAGNOSIS — M6281 Muscle weakness (generalized): Secondary | ICD-10-CM | POA: Diagnosis not present

## 2021-03-11 DIAGNOSIS — Z96651 Presence of right artificial knee joint: Secondary | ICD-10-CM | POA: Diagnosis not present

## 2021-03-13 DIAGNOSIS — F418 Other specified anxiety disorders: Secondary | ICD-10-CM | POA: Diagnosis not present

## 2021-03-14 DIAGNOSIS — M6281 Muscle weakness (generalized): Secondary | ICD-10-CM | POA: Diagnosis not present

## 2021-03-14 DIAGNOSIS — M25661 Stiffness of right knee, not elsewhere classified: Secondary | ICD-10-CM | POA: Diagnosis not present

## 2021-03-14 DIAGNOSIS — Z96651 Presence of right artificial knee joint: Secondary | ICD-10-CM | POA: Diagnosis not present

## 2021-03-14 DIAGNOSIS — M25561 Pain in right knee: Secondary | ICD-10-CM | POA: Diagnosis not present

## 2021-03-19 DIAGNOSIS — Z96651 Presence of right artificial knee joint: Secondary | ICD-10-CM | POA: Diagnosis not present

## 2021-03-21 DIAGNOSIS — M25561 Pain in right knee: Secondary | ICD-10-CM | POA: Diagnosis not present

## 2021-03-21 DIAGNOSIS — M6281 Muscle weakness (generalized): Secondary | ICD-10-CM | POA: Diagnosis not present

## 2021-03-21 DIAGNOSIS — Z96651 Presence of right artificial knee joint: Secondary | ICD-10-CM | POA: Diagnosis not present

## 2021-03-26 DIAGNOSIS — Z96651 Presence of right artificial knee joint: Secondary | ICD-10-CM | POA: Diagnosis not present

## 2021-03-26 DIAGNOSIS — M25561 Pain in right knee: Secondary | ICD-10-CM | POA: Diagnosis not present

## 2021-04-02 ENCOUNTER — Ambulatory Visit: Payer: Federal, State, Local not specified - PPO | Admitting: Family

## 2021-04-02 DIAGNOSIS — M25561 Pain in right knee: Secondary | ICD-10-CM | POA: Diagnosis not present

## 2021-04-02 DIAGNOSIS — Z96651 Presence of right artificial knee joint: Secondary | ICD-10-CM | POA: Diagnosis not present

## 2021-04-02 DIAGNOSIS — M25661 Stiffness of right knee, not elsewhere classified: Secondary | ICD-10-CM | POA: Diagnosis not present

## 2021-04-02 DIAGNOSIS — M6281 Muscle weakness (generalized): Secondary | ICD-10-CM | POA: Diagnosis not present

## 2021-04-11 DIAGNOSIS — F418 Other specified anxiety disorders: Secondary | ICD-10-CM | POA: Diagnosis not present

## 2021-04-21 ENCOUNTER — Encounter: Payer: Self-pay | Admitting: Family

## 2021-04-21 ENCOUNTER — Other Ambulatory Visit: Payer: Self-pay | Admitting: Family

## 2021-04-21 ENCOUNTER — Telehealth: Payer: Self-pay | Admitting: Family

## 2021-04-21 ENCOUNTER — Other Ambulatory Visit: Payer: Self-pay

## 2021-04-21 ENCOUNTER — Ambulatory Visit
Admission: RE | Admit: 2021-04-21 | Discharge: 2021-04-21 | Disposition: A | Discharge status: Home / Self Care | Payer: Federal, State, Local not specified - PPO | Source: Ambulatory Visit | Attending: Family | Admitting: Family

## 2021-04-21 ENCOUNTER — Ambulatory Visit: Payer: Federal, State, Local not specified - PPO | Admitting: Family

## 2021-04-21 VITALS — BP 114/82 | HR 112 | Temp 97.9°F | Ht 61.0 in | Wt 205.0 lb

## 2021-04-21 DIAGNOSIS — R1032 Left lower quadrant pain: Secondary | ICD-10-CM | POA: Diagnosis not present

## 2021-04-21 DIAGNOSIS — K5730 Diverticulosis of large intestine without perforation or abscess without bleeding: Secondary | ICD-10-CM | POA: Diagnosis not present

## 2021-04-21 DIAGNOSIS — K5792 Diverticulitis of intestine, part unspecified, without perforation or abscess without bleeding: Secondary | ICD-10-CM

## 2021-04-21 DIAGNOSIS — N200 Calculus of kidney: Secondary | ICD-10-CM | POA: Diagnosis not present

## 2021-04-21 DIAGNOSIS — Z9049 Acquired absence of other specified parts of digestive tract: Secondary | ICD-10-CM | POA: Diagnosis not present

## 2021-04-21 LAB — CBC WITH DIFFERENTIAL/PLATELET
Basophils Absolute: 0.1 10*3/uL (ref 0.0–0.1)
Basophils Relative: 0.5 % (ref 0.0–3.0)
Eosinophils Absolute: 0.1 10*3/uL (ref 0.0–0.7)
Eosinophils Relative: 0.9 % (ref 0.0–5.0)
HCT: 42.8 % (ref 36.0–46.0)
Hemoglobin: 13.3 g/dL (ref 12.0–15.0)
Lymphocytes Relative: 11.3 % — ABNORMAL LOW (ref 12.0–46.0)
Lymphs Abs: 1.3 10*3/uL (ref 0.7–4.0)
MCHC: 31 g/dL (ref 30.0–36.0)
MCV: 75.1 fl — ABNORMAL LOW (ref 78.0–100.0)
Monocytes Absolute: 0.6 10*3/uL (ref 0.1–1.0)
Monocytes Relative: 5.2 % (ref 3.0–12.0)
Neutro Abs: 9.2 10*3/uL — ABNORMAL HIGH (ref 1.4–7.7)
Neutrophils Relative %: 82.1 % — ABNORMAL HIGH (ref 43.0–77.0)
Platelets: 393 10*3/uL (ref 150.0–400.0)
RBC: 5.7 Mil/uL — ABNORMAL HIGH (ref 3.87–5.11)
RDW: 15.6 % — ABNORMAL HIGH (ref 11.5–15.5)
WBC: 11.2 10*3/uL — ABNORMAL HIGH (ref 4.0–10.5)

## 2021-04-21 LAB — URINALYSIS, ROUTINE W REFLEX MICROSCOPIC
Bilirubin Urine: NEGATIVE
Ketones, ur: NEGATIVE
Leukocytes,Ua: NEGATIVE
Nitrite: NEGATIVE
Specific Gravity, Urine: 1.02 (ref 1.000–1.030)
Total Protein, Urine: NEGATIVE
Urine Glucose: NEGATIVE
Urobilinogen, UA: 0.2 (ref 0.0–1.0)
pH: 6 (ref 5.0–8.0)

## 2021-04-21 LAB — COMPREHENSIVE METABOLIC PANEL
ALT: 12 U/L (ref 0–35)
AST: 13 U/L (ref 0–37)
Albumin: 4.1 g/dL (ref 3.5–5.2)
Alkaline Phosphatase: 104 U/L (ref 39–117)
BUN: 16 mg/dL (ref 6–23)
CO2: 21 mEq/L (ref 19–32)
Calcium: 9 mg/dL (ref 8.4–10.5)
Chloride: 106 mEq/L (ref 96–112)
Creatinine, Ser: 0.75 mg/dL (ref 0.40–1.20)
GFR: 91.94 mL/min (ref >60.00)
Glucose, Bld: 105 mg/dL — ABNORMAL HIGH (ref 70–99)
Potassium: 4.1 mEq/L (ref 3.5–5.1)
Sodium: 137 mEq/L (ref 135–145)
Total Bilirubin: 0.4 mg/dL (ref 0.2–1.2)
Total Protein: 6.8 g/dL (ref 6.0–8.3)

## 2021-04-21 MED ORDER — METRONIDAZOLE 500 MG PO TABS: 500.0000 mg | ORAL_TABLET | Freq: Three times a day (TID) | ORAL | 0 refills | Status: AC

## 2021-04-21 MED ORDER — CIPROFLOXACIN HCL 500 MG PO TABS
500.0000 mg | ORAL_TABLET | Freq: Two times a day (BID) | ORAL | 0 refills | Status: DC
Start: 2021-04-21 — End: 2021-04-22

## 2021-04-21 MED ORDER — IOHEXOL 300 MG/ML  SOLN
100.0000 mL | Freq: Once | INTRAMUSCULAR | Status: AC | PRN
  Administered 2021-04-21: 100 mL via INTRAVENOUS

## 2021-04-21 NOTE — Progress Notes (Signed)
Subjective:    Patient ID: Sierra Moore, female    DOB: 07-Feb-1971, 50 y.o.   MRN: [id number]  CC: Sierra Moore is a 50 y.o. female who presents today for an acute visit.    HPI: Chief complaint LLQ abdominal pain x 5 days, unchanged.  Describes as 'not terrible' but uncomfortable.  History diverticulitis.  Left lower quadrant pain that wraps around her back.  Pain described as migratory from higher up back to lower now. No fever, nausea or vomiting, hematuria, dysuria.  Intermittent bloating. H/o constipation in the past. Last BM today  Taking tylenol and ibuprofen intermittently with temporality relief.  NO heavy lifting.   Chronic gerd- intermittent symptoms with belching. No epigastric pain, bitter in your mouth, pain or troubling swallowing.   No heavy alcohol use  H/o renal stone  History of GERD, IBS Colonoscopy 04/2019 revealed diverticulosis, polyps; EGD at that time was normal  CT ab/p 03/2019 showed Punctate nonobstructing stones in both kidneys. HISTORY:  Past Medical History:  Diagnosis Date   Allergy    Arthritis    POLYARTHRALGIA   GERD (gastroesophageal reflux disease)    occ   History of kidney stones    currently as of 12-25-20   Leg swelling    Obesity    PMDD (premenstrual dysphoric disorder)    PMDD (premenstrual dysphoric disorder)    Raynaud disease    Vitamin D deficiency    Past Surgical History:  Procedure Laterality Date   COLONOSCOPY WITH PROPOFOL N/A 04/27/2019   Procedure: COLONOSCOPY WITH BIOPSY;  Surgeon: Lucilla Lame, MD;  Location: [facility];  Service: Endoscopy;  Laterality: N/A;   ESOPHAGOGASTRODUODENOSCOPY (EGD) WITH PROPOFOL N/A 04/27/2019   Procedure: ESOPHAGOGASTRODUODENOSCOPY (EGD) WITH PROPOFOL;  Surgeon: Lucilla Lame, MD;  Location: [facility];  Service: Endoscopy;  Laterality: N/A;   KNEE ARTHROPLASTY Right 01/03/2021   Procedure: COMPUTER ASSISTED TOTAL KNEE ARTHROPLASTY - RNFA;  Surgeon: Dereck Leep, MD;  Location: [facility];  Service: Orthopedics;  Laterality: Right;   POLYPECTOMY N/A 04/27/2019   Procedure: POLYPECTOMY;  Surgeon: Lucilla Lame, MD;  Location: [facility];  Service: Endoscopy;  Laterality: N/A;   ROBOTIC ASSISTED LAPAROSCOPIC CHOLECYSTECTOMY N/A 01/10/2019   Procedure: ROBOTIC ASSISTED LAPAROSCOPIC CHOLECYSTECTOMY;  Surgeon: Jules Husbands, MD;  Location: [facility];  Service: General;  Laterality: N/A;   TONSILLECTOMY     Family History  Problem Relation Age of Onset   Arthritis Mother    Raynaud syndrome Mother    Arthritis Father    Breast cancer Neg Hx    Bladder Cancer Neg Hx    Kidney cancer Neg Hx     Allergies: Penicillins Current Outpatient Medications on File Prior to Visit  Medication Sig Dispense Refill   acetaminophen (TYLENOL) 500 MG tablet Take 500 mg by mouth every 6 (six) hours as needed (for pain).     diclofenac Sodium (VOLTAREN) 1 % GEL Apply 2 g topically 4 (four) times daily as needed (pain.).     fluticasone (FLONASE) 50 MCG/ACT nasal spray Place 1 spray into both nostrils daily as needed (allergies).     LESSINA-28 0.1-20 MG-MCG tablet Take 1 tablet by mouth daily. (Patient taking differently: Take 1 tablet by mouth every evening.) 63 tablet 3   meloxicam (MOBIC) 15 MG tablet Take 1 tablet by mouth daily as needed.     omeprazole (PRILOSEC OTC) 20 MG tablet Take 20 mg by mouth as needed.     No  current facility-administered medications on file prior to visit.    Social History   Tobacco Use   Smoking status: Former    Packs/day: 0.50    Years: 10.00    Pack years: 5.00    Types: Cigarettes    Quit date: 2005    Years since quitting: 17.8   Smokeless tobacco: Never  Vaping Use   Vaping Use: Never used  Substance Use Topics   Alcohol use: No   Drug use: No    Review of Systems  Constitutional:  Negative for chills and fever.  HENT:  Negative for trouble swallowing.   Respiratory:  Negative for cough.    Cardiovascular:  Negative for chest pain and palpitations.  Gastrointestinal:  Positive for abdominal distention and abdominal pain. Negative for blood in stool, constipation, diarrhea, nausea and vomiting.  Genitourinary:  Negative for dysuria and hematuria.     Objective:    BP 114/82 (BP Location: Left Arm, Patient Position: Sitting, Cuff Size: Large)   Pulse (!) 112   Temp 97.9 F (36.6 C) (Oral)   Ht 5\' 1"  (1.549 m)   Wt 205 lb (93 kg)   SpO2 99%   BMI 38.73 kg/m    Physical Exam Vitals reviewed.  Constitutional:      Appearance: Normal appearance. She is well-developed.  Eyes:     Conjunctiva/sclera: Conjunctivae normal.  Cardiovascular:     Rate and Rhythm: Normal rate and regular rhythm.     Pulses: Normal pulses.     Heart sounds: Normal heart sounds.  Pulmonary:     Effort: Pulmonary effort is normal.     Breath sounds: Normal breath sounds. No wheezing, rhonchi or rales.  Abdominal:     General: Bowel sounds are normal. There is no distension.     Palpations: Abdomen is soft. Abdomen is not rigid. There is no fluid wave or mass.     Tenderness: There is abdominal tenderness in the left lower quadrant. There is no right CVA tenderness, left CVA tenderness, guarding or rebound.  Skin:    General: Skin is warm and dry.  Neurological:     Mental Status: She is alert.  Psychiatric:        Speech: Speech normal.        Behavior: Behavior normal.        Thought Content: Thought content normal.       Assessment & Plan:   Problem List Items Addressed This Visit       Other   Left lower quadrant abdominal pain - Primary    Afebrile.  Patient is nontoxic in appearance.  I was able to elicit left lower quadrant focal pain on exam today.  Discussed differentials with patient including renal stone, diverticulitis, she also has history of constipation.  Last BM today.  Pending stat CT and labs today.  Close follow-up      Relevant Orders   CT ABDOMEN PELVIS W  CONTRAST   Urine Culture   Urinalysis, Routine w reflex microscopic   Comprehensive metabolic panel   CBC with Differential/Platelet   Celiac Disease Ab Screen w/Rfx      I have discontinued Jerolyn L. Oh's celecoxib, oxyCODONE, traMADol, ondansetron, and enoxaparin. I am also having her maintain her diclofenac Sodium, fluticasone, LESSINA-28, acetaminophen, omeprazole, and meloxicam.   No orders of the defined types were placed in this encounter.   Return precautions given.   Risks, benefits, and alternatives of the medications and treatment plan prescribed today  were discussed, and patient expressed understanding.   Education regarding symptom management and diagnosis given to patient on AVS.  Continue to follow with Burnard Hawthorne, FNP for routine health maintenance.   Kirby Funk and I agreed with plan.   Mable Paris, FNP

## 2021-04-21 NOTE — Assessment & Plan Note (Signed)
Afebrile.  Patient is nontoxic in appearance.  I was able to elicit left lower quadrant focal pain on exam today.  Discussed differentials with patient including renal stone, diverticulitis, she also has history of constipation.  Last BM today.  Pending stat CT and labs today.  Close follow-up

## 2021-04-22 ENCOUNTER — Telehealth: Payer: Self-pay

## 2021-04-22 DIAGNOSIS — K5792 Diverticulitis of intestine, part unspecified, without perforation or abscess without bleeding: Secondary | ICD-10-CM

## 2021-04-22 LAB — URINE CULTURE
MICRO NUMBER:: 12511272
SPECIMEN QUALITY:: ADEQUATE

## 2021-04-22 MED ORDER — CIPROFLOXACIN 500 MG/5ML (10%) PO SUSR: 500.0000 mg | Freq: Two times a day (BID) | ORAL | 0 refills | Status: AC

## 2021-04-22 NOTE — Telephone Encounter (Signed)
Pt called and stated that both the Cipro as well as Flagyl are very large. She is unable to swallow even if she cuts in half. I asked Caie & Cipro cannot be crushed & felt it was best that the liquid solution be sent in in replacement. I let her know that I would see if you could send in, but also did not know if insurance would cover. Please advise?

## 2021-04-22 NOTE — Telephone Encounter (Signed)
Patient is returning your call from earlier.Please call her at [phone].

## 2021-04-22 NOTE — Telephone Encounter (Signed)
Thank you catie  Call pt  I consulted with Catie Darnelle Maffucci , pharm D and we agreed to go ahead and crush flagyl and place in applesauce to take TID. As it is not flagl XR, she may crush. There is not an oral suspension on formulary.   For ciprofloxacin, I have sent in oral suspension of ciprofloxaxin

## 2021-04-22 NOTE — Telephone Encounter (Signed)
Patient called and notified of below. She will let us know if any issues.

## 2021-04-22 NOTE — Telephone Encounter (Signed)
LMTCB

## 2021-04-23 ENCOUNTER — Encounter: Payer: Self-pay | Admitting: Family

## 2021-04-23 ENCOUNTER — Telehealth: Payer: Self-pay

## 2021-04-23 DIAGNOSIS — R1032 Left lower quadrant pain: Secondary | ICD-10-CM

## 2021-04-23 NOTE — Telephone Encounter (Signed)
Pt called office back. Pt states her symptoms are still the same as before and she just received a call from [facility] before the office called notifying her that the medicine was in.

## 2021-04-23 NOTE — Telephone Encounter (Signed)
Error

## 2021-04-23 NOTE — Telephone Encounter (Signed)
Labs ordered for UA for future labs

## 2021-04-24 LAB — CELIAC DISEASE AB SCREEN W/RFX
Antigliadin Abs, IgA: 3 units (ref 0–19)
IgA/Immunoglobulin A, Serum: 293 mg/dL (ref 87–352)
Transglutaminase IgA: <2 U/mL (ref 0–3)

## 2021-04-28 DIAGNOSIS — F418 Other specified anxiety disorders: Secondary | ICD-10-CM | POA: Diagnosis not present

## 2021-04-29 DIAGNOSIS — M5416 Radiculopathy, lumbar region: Secondary | ICD-10-CM | POA: Diagnosis not present

## 2021-04-29 DIAGNOSIS — M62830 Muscle spasm of back: Secondary | ICD-10-CM | POA: Diagnosis not present

## 2021-04-29 DIAGNOSIS — M9903 Segmental and somatic dysfunction of lumbar region: Secondary | ICD-10-CM | POA: Diagnosis not present

## 2021-04-29 DIAGNOSIS — M5136 Other intervertebral disc degeneration, lumbar region: Secondary | ICD-10-CM | POA: Diagnosis not present

## 2021-04-30 DIAGNOSIS — M5416 Radiculopathy, lumbar region: Secondary | ICD-10-CM | POA: Diagnosis not present

## 2021-04-30 DIAGNOSIS — M5136 Other intervertebral disc degeneration, lumbar region: Secondary | ICD-10-CM | POA: Diagnosis not present

## 2021-04-30 DIAGNOSIS — M9903 Segmental and somatic dysfunction of lumbar region: Secondary | ICD-10-CM | POA: Diagnosis not present

## 2021-04-30 DIAGNOSIS — M62830 Muscle spasm of back: Secondary | ICD-10-CM | POA: Diagnosis not present

## 2021-05-05 ENCOUNTER — Other Ambulatory Visit: Payer: Federal, State, Local not specified - PPO

## 2021-05-06 DIAGNOSIS — M5136 Other intervertebral disc degeneration, lumbar region: Secondary | ICD-10-CM | POA: Diagnosis not present

## 2021-05-06 DIAGNOSIS — M9903 Segmental and somatic dysfunction of lumbar region: Secondary | ICD-10-CM | POA: Diagnosis not present

## 2021-05-06 DIAGNOSIS — M62830 Muscle spasm of back: Secondary | ICD-10-CM | POA: Diagnosis not present

## 2021-05-06 DIAGNOSIS — M5416 Radiculopathy, lumbar region: Secondary | ICD-10-CM | POA: Diagnosis not present

## 2021-05-12 DIAGNOSIS — M5416 Radiculopathy, lumbar region: Secondary | ICD-10-CM | POA: Diagnosis not present

## 2021-05-12 DIAGNOSIS — M9903 Segmental and somatic dysfunction of lumbar region: Secondary | ICD-10-CM | POA: Diagnosis not present

## 2021-05-12 DIAGNOSIS — M62830 Muscle spasm of back: Secondary | ICD-10-CM | POA: Diagnosis not present

## 2021-05-12 DIAGNOSIS — M5136 Other intervertebral disc degeneration, lumbar region: Secondary | ICD-10-CM | POA: Diagnosis not present

## 2021-05-21 ENCOUNTER — Ambulatory Visit: Payer: Federal, State, Local not specified - PPO | Admitting: Gastroenterology

## 2021-06-09 DIAGNOSIS — F418 Other specified anxiety disorders: Secondary | ICD-10-CM | POA: Diagnosis not present

## 2021-06-17 ENCOUNTER — Encounter: Payer: Self-pay | Admitting: Family

## 2021-07-08 DIAGNOSIS — M5416 Radiculopathy, lumbar region: Secondary | ICD-10-CM | POA: Diagnosis not present

## 2021-07-08 DIAGNOSIS — M9903 Segmental and somatic dysfunction of lumbar region: Secondary | ICD-10-CM | POA: Diagnosis not present

## 2021-07-08 DIAGNOSIS — M62830 Muscle spasm of back: Secondary | ICD-10-CM | POA: Diagnosis not present

## 2021-07-08 DIAGNOSIS — M5136 Other intervertebral disc degeneration, lumbar region: Secondary | ICD-10-CM | POA: Diagnosis not present

## 2021-07-10 DIAGNOSIS — Z96651 Presence of right artificial knee joint: Secondary | ICD-10-CM | POA: Diagnosis not present

## 2021-07-10 DIAGNOSIS — M5416 Radiculopathy, lumbar region: Secondary | ICD-10-CM | POA: Diagnosis not present

## 2021-07-10 DIAGNOSIS — M1712 Unilateral primary osteoarthritis, left knee: Secondary | ICD-10-CM | POA: Diagnosis not present

## 2021-07-17 DIAGNOSIS — F418 Other specified anxiety disorders: Secondary | ICD-10-CM | POA: Diagnosis not present

## 2021-07-22 ENCOUNTER — Other Ambulatory Visit: Payer: Self-pay | Admitting: Certified Nurse Midwife

## 2021-07-29 DIAGNOSIS — M5416 Radiculopathy, lumbar region: Secondary | ICD-10-CM | POA: Diagnosis not present

## 2021-07-29 DIAGNOSIS — M5136 Other intervertebral disc degeneration, lumbar region: Secondary | ICD-10-CM | POA: Diagnosis not present

## 2021-07-29 DIAGNOSIS — M62830 Muscle spasm of back: Secondary | ICD-10-CM | POA: Diagnosis not present

## 2021-07-29 DIAGNOSIS — M9903 Segmental and somatic dysfunction of lumbar region: Secondary | ICD-10-CM | POA: Diagnosis not present

## 2021-08-04 ENCOUNTER — Encounter: Payer: Federal, State, Local not specified - PPO | Admitting: Certified Nurse Midwife

## 2021-08-07 DIAGNOSIS — F411 Generalized anxiety disorder: Secondary | ICD-10-CM | POA: Diagnosis not present

## 2021-08-19 DIAGNOSIS — M5136 Other intervertebral disc degeneration, lumbar region: Secondary | ICD-10-CM | POA: Diagnosis not present

## 2021-08-19 DIAGNOSIS — M62830 Muscle spasm of back: Secondary | ICD-10-CM | POA: Diagnosis not present

## 2021-08-19 DIAGNOSIS — M5416 Radiculopathy, lumbar region: Secondary | ICD-10-CM | POA: Diagnosis not present

## 2021-08-19 DIAGNOSIS — M9903 Segmental and somatic dysfunction of lumbar region: Secondary | ICD-10-CM | POA: Diagnosis not present

## 2021-08-25 DIAGNOSIS — F418 Other specified anxiety disorders: Secondary | ICD-10-CM | POA: Diagnosis not present

## 2021-08-29 DIAGNOSIS — M5416 Radiculopathy, lumbar region: Secondary | ICD-10-CM | POA: Diagnosis not present

## 2021-09-04 ENCOUNTER — Other Ambulatory Visit: Payer: Self-pay | Admitting: Orthopedic Surgery

## 2021-09-04 DIAGNOSIS — M5416 Radiculopathy, lumbar region: Secondary | ICD-10-CM

## 2021-09-09 DIAGNOSIS — M5136 Other intervertebral disc degeneration, lumbar region: Secondary | ICD-10-CM | POA: Diagnosis not present

## 2021-09-09 DIAGNOSIS — M5416 Radiculopathy, lumbar region: Secondary | ICD-10-CM | POA: Diagnosis not present

## 2021-09-09 DIAGNOSIS — M62830 Muscle spasm of back: Secondary | ICD-10-CM | POA: Diagnosis not present

## 2021-09-09 DIAGNOSIS — M9903 Segmental and somatic dysfunction of lumbar region: Secondary | ICD-10-CM | POA: Diagnosis not present

## 2021-09-11 DIAGNOSIS — F418 Other specified anxiety disorders: Secondary | ICD-10-CM | POA: Diagnosis not present

## 2021-09-13 ENCOUNTER — Other Ambulatory Visit: Payer: Self-pay

## 2021-09-13 ENCOUNTER — Ambulatory Visit
Admission: RE | Admit: 2021-09-13 | Discharge: 2021-09-13 | Disposition: A | Discharge status: Home / Self Care | Payer: Federal, State, Local not specified - PPO | Source: Ambulatory Visit | Attending: Orthopedic Surgery | Admitting: Orthopedic Surgery

## 2021-09-13 DIAGNOSIS — M5416 Radiculopathy, lumbar region: Secondary | ICD-10-CM | POA: Insufficient documentation

## 2021-09-13 DIAGNOSIS — M5126 Other intervertebral disc displacement, lumbar region: Secondary | ICD-10-CM | POA: Diagnosis not present

## 2021-09-23 DIAGNOSIS — M62830 Muscle spasm of back: Secondary | ICD-10-CM | POA: Diagnosis not present

## 2021-09-23 DIAGNOSIS — M5416 Radiculopathy, lumbar region: Secondary | ICD-10-CM | POA: Diagnosis not present

## 2021-09-23 DIAGNOSIS — M9903 Segmental and somatic dysfunction of lumbar region: Secondary | ICD-10-CM | POA: Diagnosis not present

## 2021-09-23 DIAGNOSIS — M5136 Other intervertebral disc degeneration, lumbar region: Secondary | ICD-10-CM | POA: Diagnosis not present

## 2021-09-27 ENCOUNTER — Other Ambulatory Visit: Payer: Self-pay | Admitting: Certified Nurse Midwife

## 2021-09-29 ENCOUNTER — Other Ambulatory Visit: Payer: Self-pay | Admitting: Certified Nurse Midwife

## 2021-09-29 DIAGNOSIS — F418 Other specified anxiety disorders: Secondary | ICD-10-CM | POA: Diagnosis not present

## 2021-10-07 DIAGNOSIS — M62830 Muscle spasm of back: Secondary | ICD-10-CM | POA: Diagnosis not present

## 2021-10-07 DIAGNOSIS — M9903 Segmental and somatic dysfunction of lumbar region: Secondary | ICD-10-CM | POA: Diagnosis not present

## 2021-10-07 DIAGNOSIS — M5136 Other intervertebral disc degeneration, lumbar region: Secondary | ICD-10-CM | POA: Diagnosis not present

## 2021-10-07 DIAGNOSIS — M5416 Radiculopathy, lumbar region: Secondary | ICD-10-CM | POA: Diagnosis not present

## 2021-10-17 DIAGNOSIS — F418 Other specified anxiety disorders: Secondary | ICD-10-CM | POA: Diagnosis not present

## 2021-10-21 DIAGNOSIS — M5416 Radiculopathy, lumbar region: Secondary | ICD-10-CM | POA: Diagnosis not present

## 2021-10-21 DIAGNOSIS — M5136 Other intervertebral disc degeneration, lumbar region: Secondary | ICD-10-CM | POA: Diagnosis not present

## 2021-10-21 DIAGNOSIS — M62830 Muscle spasm of back: Secondary | ICD-10-CM | POA: Diagnosis not present

## 2021-10-21 DIAGNOSIS — M9903 Segmental and somatic dysfunction of lumbar region: Secondary | ICD-10-CM | POA: Diagnosis not present

## 2021-10-28 ENCOUNTER — Encounter: Payer: Self-pay | Admitting: Certified Nurse Midwife

## 2021-10-28 ENCOUNTER — Ambulatory Visit (INDEPENDENT_AMBULATORY_CARE_PROVIDER_SITE_OTHER): Payer: Federal, State, Local not specified - PPO | Admitting: Certified Nurse Midwife

## 2021-10-28 VITALS — BP 139/79 | HR 91 | Ht 61.0 in | Wt 214.0 lb

## 2021-10-28 DIAGNOSIS — Z1231 Encounter for screening mammogram for malignant neoplasm of breast: Secondary | ICD-10-CM

## 2021-10-28 DIAGNOSIS — Z01419 Encounter for gynecological examination (general) (routine) without abnormal findings: Secondary | ICD-10-CM | POA: Diagnosis not present

## 2021-10-28 MED ORDER — LESSINA 0.1-20 MG-MCG PO TABS: ORAL_TABLET | ORAL | 3 refills | Status: DC

## 2021-10-28 NOTE — Progress Notes (Signed)
? ? ?GYNECOLOGY ANNUAL PREVENTATIVE CARE ENCOUNTER NOTE ? ?History:    ? Sierra Moore is a 51 y.o. G10P2002 female here for a routine annual gynecologic exam.  Current complaints: none.   Denies abnormal vaginal bleeding, discharge, pelvic pain, problems with intercourse or other gynecologic concerns.  ?  ? ?Social ?Relationship: married ?Living: spouse ?Work: Agricultural consultant ?Exercise: few times a month  ?Smoke/Alcohol/drug use: denies use  ? ?Gynecologic History ?No LMP recorded. (Menstrual status: Oral contraceptives). ?Contraception: OCP (estrogen/progesterone) ?Last Pap: 9/18/202. Results were: normal with negative HPV ?Last mammogram: 09/23/2020. Results were: normal ? ?Obstetric History ?OB History  ?Gravida Para Term Preterm AB Living  ?'2 2 2     2  '$ ?SAB IAB Ectopic Multiple Live Births  ?        2  ?  ?# Outcome Date GA Lbr Len/2nd Weight Sex Delivery Anes PTL Lv  ?2 Term 10/10/98 [redacted]w[redacted]d 8 lb (3.629 kg) F Vag-Spont EPI N LIV  ?1 Term 04/08/96 415w0d6 lb 12 oz (3.062 kg) M Vag-Spont Other N LIV  ? ? ?Past Medical History:  ?Diagnosis Date  ? Allergy   ? Arthritis   ? POLYARTHRALGIA  ? GERD (gastroesophageal reflux disease)   ? occ  ? History of kidney stones   ? currently as of 12-25-20  ? Leg swelling   ? Obesity   ? PMDD (premenstrual dysphoric disorder)   ? PMDD (premenstrual dysphoric disorder)   ? Raynaud disease   ? Vitamin D deficiency   ? ? ?Past Surgical History:  ?Procedure Laterality Date  ? COLONOSCOPY WITH PROPOFOL N/A 04/27/2019  ? Procedure: COLONOSCOPY WITH BIOPSY;  Surgeon: WoLucilla LameMD;  [place] Service: [place];  Laterality: N/A;  ? ESOPHAGOGASTRODUODENOSCOPY (EGD) WITH PROPOFOL N/A 04/27/2019  ? Procedure: ESOPHAGOGASTRODUODENOSCOPY (EGD) WITH PROPOFOL;  Surgeon: WoLucilla LameMD;  [place] [facility] Service: Endoscopy;  Laterality: N/A;  ? KNEE ARTHROPLASTY Right 01/03/2021  ? Procedure: COMPUTER ASSISTED TOTAL KNEE ARTHROPLASTY -  RNFA;  Surgeon: HoDereck LeepMD;  Location: [facility];  Service: Orthopedics;  Laterality: Right;  ? POLYPECTOMY N/A 04/27/2019  ? Procedure: POLYPECTOMY;  Surgeon: WoLucilla LameMD;  [place] [facility] Service: Endoscopy;  Laterality: N/A;  ? ROBOTIC ASSISTED LAPAROSCOPIC CHOLECYSTECTOMY N/A 01/10/2019  ? Procedure: ROBOTIC ASSISTED LAPAROSCOPIC CHOLECYSTECTOMY;  Surgeon: PaJules HusbandsMD;  Location: [facility];  Service: General;  Laterality: N/A;  ? TONSILLECTOMY    ? ? ?Current Outpatient Medications on File Prior to Visit  ?Medication Sig Dispense Refill  ? acetaminophen (TYLENOL) 500 MG tablet Take 500 mg by mouth every 6 (six) hours as needed (for pain).    ? diclofenac Sodium (VOLTAREN) 1 % GEL Apply 2 g topically 4 (four) times daily as needed (pain.).    ? LESSINA-28 0.1-20 MG-MCG tablet TAKE 1 TABLET BY MOUTH EVERY DAY CONTINUOUSLY 84 tablet 0  ? meloxicam (MOBIC) 15 MG tablet Take 1 tablet by mouth daily as needed.    ? omeprazole (PRILOSEC OTC) 20 MG tablet Take 20 mg by mouth as needed.    ? fluticasone (FLONASE) 50 MCG/ACT nasal spray Place 1 spray into both nostrils daily as needed (allergies).    ? ?No current facility-administered medications on file prior to visit.  ? ? ?Allergies  ?Allergen Reactions  ? Penicillins Hives  ?  IgE = 16 (WNL) on 12/25/2020 ? ?Did it involve swelling of the face/tongue/throat, SOB, or  low BP? No ?Did it involve sudden or severe rash/hives, skin peeling, or any reaction on the inside of your mouth or nose? No ?Did you need to seek medical attention at a hospital or doctor's office? No ?When did it last happen?     within the last 5 years  ?If all above answers are "NO", may proceed with cephalosporin use. ?  ? ? ?Social History:  reports that she quit smoking about 18 years ago. Her smoking use included cigarettes. She has a 5.00 pack-year smoking history. She has never used smokeless tobacco. She reports that she does not drink alcohol and does not use  drugs. ? ?Family History  ?Problem Relation Age of Onset  ? Arthritis Mother   ? Raynaud syndrome Mother   ? Arthritis Father   ? Breast cancer Neg Hx   ? Bladder Cancer Neg Hx   ? Kidney cancer Neg Hx   ? ? ?The following portions of the patient's history were reviewed and updated as appropriate: allergies, current medications, past family history, past medical history, past social history, past surgical history and problem list. ? ?Review of Systems ?Pertinent items noted in HPI and remainder of comprehensive ROS otherwise negative. ? ?Physical Exam:  ?BP 139/79   Pulse 91   Ht '5\' 1"'$  (1.549 m)   Wt 214 lb (97.1 kg)   BMI 40.43 kg/m?  ?CONSTITUTIONAL: Well-developed, well-nourished female in no acute distress.  ?HENT:  Normocephalic, atraumatic, External right and left ear normal. Oropharynx is clear and moist ?EYES: Conjunctivae and EOM are normal. Pupils are equal, round, and reactive to light. No scleral icterus.  ?NECK: Normal range of motion, supple, no masses.  Normal thyroid.  ?SKIN: Skin is warm and dry. No rash noted. Not diaphoretic. No erythema. No pallor. ?MUSCULOSKELETAL: Normal range of motion. No tenderness.  No cyanosis, clubbing, or edema.  2+ distal pulses. ?NEUROLOGIC: Alert and oriented to person, place, and time. Normal reflexes, muscle tone coordination.  ?PSYCHIATRIC: Normal mood and affect. Normal behavior. Normal judgment and thought content. ?CARDIOVASCULAR: Normal heart rate noted, regular rhythm ?RESPIRATORY: Clear to auscultation bilaterally. Effort and breath sounds normal, no problems with respiration noted. ?BREASTS: Symmetric in size. No masses, tenderness, skin changes, nipple drainage, or lymphadenopathy bilaterally.  ?ABDOMEN: Soft, no distention noted.  No tenderness, rebound or guarding.  ?PELVIC: Normal appearing external genitalia and urethral meatus; normal appearing vaginal mucosa and cervix.  No abnormal discharge noted.  Pap smear not due.  Uterine size, and  other  palpable masses difficult to evaluate due to body habitus, no uterine or adnexal tenderness.  . ?  ?Assessment and Plan:  ?  1. Well woman exam with routine gynecological exam ? ?  ?Pap: not due ?Mammogram : ordered ?Labs: declines ?Refills: OCP, discussed risk/benefits. Pt request to use x 1 more year due to history of bad cycles.  ?Referral: none ?Routine preventative health maintenance measures emphasized. ?Please refer to After Visit Summary for other counseling recommendations.  ?   ? ?Philip Aspen, CNM ?[facility] ?[facility]   ?

## 2021-10-31 DIAGNOSIS — F418 Other specified anxiety disorders: Secondary | ICD-10-CM | POA: Diagnosis not present

## 2021-11-04 DIAGNOSIS — M9903 Segmental and somatic dysfunction of lumbar region: Secondary | ICD-10-CM | POA: Diagnosis not present

## 2021-11-04 DIAGNOSIS — M5136 Other intervertebral disc degeneration, lumbar region: Secondary | ICD-10-CM | POA: Diagnosis not present

## 2021-11-04 DIAGNOSIS — M5416 Radiculopathy, lumbar region: Secondary | ICD-10-CM | POA: Diagnosis not present

## 2021-11-04 DIAGNOSIS — M62830 Muscle spasm of back: Secondary | ICD-10-CM | POA: Diagnosis not present

## 2021-11-10 NOTE — Discharge Instructions (Signed)
Instructions after Total Knee Replacement   Sierra Moore, Jr., M.D.     Dept. of Orthopaedics & Sports Medicine  [facility]  [address]  [place], [place]  [address]  Phone: [phone]   Fax: [phone]    DIET: Drink plenty of non-alcoholic fluids. Resume your normal diet. Include foods high in fiber.  ACTIVITY:  You may use crutches or a walker with weight-bearing as tolerated, unless instructed otherwise. You may be weaned off of the walker or crutches by your Physical Therapist.  Do NOT place pillows under the knee. Anything placed under the knee could limit your ability to straighten the knee.   Continue doing gentle exercises. Exercising will reduce the pain and swelling, increase motion, and prevent muscle weakness.   Please continue to use the TED compression stockings for 6 weeks. You may remove the stockings at night, but should reapply them in the morning. Do not drive or operate any equipment until instructed.  WOUND CARE:  Continue to use the PolarCare or ice packs periodically to reduce pain and swelling. You may bathe or shower after the staples are removed at the first office visit following surgery.  MEDICATIONS: You may resume your regular medications. Please take the pain medication as prescribed on the medication. Do not take pain medication on an empty stomach. You have been given a prescription for a blood thinner (Lovenox or Coumadin). Please take the medication as instructed. (NOTE: After completing a 2 week course of Lovenox, take one Enteric-coated aspirin once a day. This along with elevation will help reduce the possibility of phlebitis in your operated leg.) Do not drive or drink alcoholic beverages when taking pain medications.  CALL THE OFFICE FOR: Temperature above 101 degrees Excessive bleeding or drainage on the dressing. Excessive swelling, coldness, or paleness of the toes. Persistent nausea and vomiting.  FOLLOW-UP:  You  should have an appointment to return to the office in 10-14 days after surgery. Arrangements have been made for continuation of Physical Therapy (either home therapy or outpatient therapy).   [facility] Department Directory         [email or web address]       [email or web address]          Cardiology  Appointments: [place] - [phone] [place] - [phone]  Endocrinology  Appointments: [place] - [phone] [place] - [phone]  Gastroenterology  Appointments: [place] - [phone] [place] - [phone]        General Surgery   Appointments: [place] - [phone]  Internal Medicine/Family Medicine  Appointments: [place] - [phone] [place] - [phone] [place] - [phone]  Metabolic and Weigh Loss Surgery  Appointments: [place] - [phone]        Neurology  Appointments: [place] - [phone] [place] - [phone]  Neurosurgery  Appointments: [place] - [phone]  Obstetrics & Gynecology  Appointments: [place] - [phone] [place] - [phone]        Pediatrics  Appointments: [place] - [phone] [place] - [phone]  Physiatry  Appointments: [place] -[phone]  Physical Therapy  Appointments: [place] - [phone] [place] - [phone]        Podiatry  Appointments: [place] - [phone] [place] - [phone]  Pulmonology  Appointments: [place] - [phone]  Rheumatology  Appointments: [place] - [phone]        [place] Location: [facility]  [address] , [place]  [address]  [place] Location: [facility] [address] [place], [place]  [address]  [place] Location: [facility] [address] [place], [place]  [address]    

## 2021-11-14 ENCOUNTER — Other Ambulatory Visit: Payer: Self-pay

## 2021-11-14 ENCOUNTER — Other Ambulatory Visit: Payer: Federal, State, Local not specified - PPO

## 2021-11-14 ENCOUNTER — Encounter
Admission: RE | Admit: 2021-11-14 | Discharge: 2021-11-14 | Disposition: A | Discharge status: Home / Self Care | Payer: Federal, State, Local not specified - PPO | Source: Ambulatory Visit | Attending: Orthopedic Surgery | Admitting: Orthopedic Surgery

## 2021-11-14 VITALS — BP 146/95 | HR 89 | Temp 97.6°F | Resp 20 | Ht 61.0 in | Wt 214.0 lb

## 2021-11-14 DIAGNOSIS — M1712 Unilateral primary osteoarthritis, left knee: Secondary | ICD-10-CM | POA: Insufficient documentation

## 2021-11-14 DIAGNOSIS — Z96659 Presence of unspecified artificial knee joint: Secondary | ICD-10-CM

## 2021-11-14 DIAGNOSIS — Z96652 Presence of left artificial knee joint: Secondary | ICD-10-CM | POA: Insufficient documentation

## 2021-11-14 DIAGNOSIS — R319 Hematuria, unspecified: Secondary | ICD-10-CM | POA: Diagnosis not present

## 2021-11-14 DIAGNOSIS — Z01818 Encounter for other preprocedural examination: Secondary | ICD-10-CM | POA: Diagnosis not present

## 2021-11-14 DIAGNOSIS — I7300 Raynaud's syndrome without gangrene: Secondary | ICD-10-CM | POA: Insufficient documentation

## 2021-11-14 DIAGNOSIS — M25562 Pain in left knee: Secondary | ICD-10-CM | POA: Diagnosis not present

## 2021-11-14 DIAGNOSIS — Z87898 Personal history of other specified conditions: Secondary | ICD-10-CM | POA: Diagnosis not present

## 2021-11-14 DIAGNOSIS — Z01810 Encounter for preprocedural cardiovascular examination: Secondary | ICD-10-CM | POA: Diagnosis not present

## 2021-11-14 LAB — URINALYSIS, ROUTINE W REFLEX MICROSCOPIC
Bacteria, UA: NONE SEEN
Bilirubin Urine: NEGATIVE
Glucose, UA: NEGATIVE mg/dL
Ketones, ur: NEGATIVE mg/dL
Leukocytes,Ua: NEGATIVE
Nitrite: NEGATIVE
Protein, ur: NEGATIVE mg/dL
Specific Gravity, Urine: 1.025 (ref 1.005–1.030)
pH: 5 (ref 5.0–8.0)

## 2021-11-14 LAB — CBC
HCT: 43.8 % (ref 36.0–46.0)
Hemoglobin: 13.5 g/dL (ref 12.0–15.0)
MCH: 24.9 pg — ABNORMAL LOW (ref 26.0–34.0)
MCHC: 30.8 g/dL (ref 30.0–36.0)
MCV: 80.7 fL (ref 80.0–100.0)
Platelets: 390 10*3/uL (ref 150–400)
RBC: 5.43 MIL/uL — ABNORMAL HIGH (ref 3.87–5.11)
RDW: 15.1 % (ref 11.5–15.5)
WBC: 13.7 10*3/uL — ABNORMAL HIGH (ref 4.0–10.5)
nRBC: 0 % (ref 0.0–0.2)

## 2021-11-14 LAB — COMPREHENSIVE METABOLIC PANEL
ALT: 14 U/L (ref 0–44)
AST: 15 U/L (ref 15–41)
Albumin: 3.5 g/dL (ref 3.5–5.0)
Alkaline Phosphatase: 97 U/L (ref 38–126)
Anion gap: 6 (ref 5–15)
BUN: 18 mg/dL (ref 6–20)
CO2: 25 mmol/L (ref 22–32)
Calcium: 9 mg/dL (ref 8.9–10.3)
Chloride: 110 mmol/L (ref 98–111)
Creatinine, Ser: 0.76 mg/dL (ref 0.44–1.00)
GFR, Estimated: >60 mL/min (ref >60)
Glucose, Bld: 98 mg/dL (ref 70–99)
Potassium: 3.9 mmol/L (ref 3.5–5.1)
Sodium: 141 mmol/L (ref 135–145)
Total Bilirubin: 0.5 mg/dL (ref 0.3–1.2)
Total Protein: 7.3 g/dL (ref 6.5–8.1)

## 2021-11-14 LAB — TYPE AND SCREEN
ABO/RH(D): O POS
Antibody Screen: NEGATIVE

## 2021-11-14 LAB — SURGICAL PCR SCREEN
MRSA, PCR: NEGATIVE
Staphylococcus aureus: POSITIVE — AB

## 2021-11-14 LAB — SEDIMENTATION RATE: Sed Rate: 17 mm/hr (ref 0–30)

## 2021-11-14 LAB — C-REACTIVE PROTEIN: CRP: 2.2 mg/dL — ABNORMAL HIGH (ref <1.0)

## 2021-11-14 NOTE — Patient Instructions (Addendum)
Your procedure is scheduled on: 11/24/2021  ?Report to the Registration Desk on the 1st floor of the [facility]. ?To find out your arrival time, please call [phone] between 1PM - 3PM on: 5/19 /2023  ?If your arrival time is 6:00 am, do not arrive prior to that time as the [place] entrance doors do not open until 6:00 am. ? ?REMEMBER: ?Instructions that are not followed completely may result in serious medical risk, up to and including death; or upon the discretion of your surgeon and anesthesiologist your surgery may need to be rescheduled. ? ?Do not eat food after midnight the night before surgery.  ?No gum chewing, lozengers or hard candies. ? ?You may however, drink CLEAR liquids up to 2 hours before you are scheduled to arrive for your surgery. Do not drink anything within 2 hours of your scheduled arrival time. ? ?Clear liquids include: ?- water  ?- apple juice without pulp ?- gatorade (not RED colors) ?- black coffee or tea (Do NOT add milk or creamers to the coffee or tea) ?Do NOT drink anything that is not on this list. ? ? ?In addition, your doctor has ordered for you to drink the provided  ?Ensure Pre-Surgery Clear Carbohydrate Drink  ? ?Drinking this carbohydrate drink up to two hours before surgery helps to reduce insulin resistance and improve patient outcomes. Please complete drinking 2 hours prior to scheduled arrival time. ? ?TAKE THESE MEDICATIONS THE MORNING OF SURGERY WITH A SIP OF WATER: ?PRILOSEC ? ?Follow recommendations from Cardiologist, Pulmonologist or PCP regarding stopping Aspirin, Coumadin, Plavix, Eliquis, Pradaxa, or Pletal. ? ?One week prior to surgery: ?Stop Anti-inflammatories (NSAIDS) such as Advil, Aleve, Ibuprofen, Motrin, Naproxen, Naprosyn and Aspirin based products such as Excedrin, Goodys Powder, BC Powder. ?Stop ANY OVER THE COUNTER supplements until after surgery like; D3. ?You may however, continue to take Tylenol if needed for pain up until the day of  surgery. ? ?No Alcohol for 24 hours before or after surgery. ? ?No Smoking including e-cigarettes for 24 hours prior to surgery.  ?No chewable tobacco products for at least 6 hours prior to surgery.  ?No nicotine patches on the day of surgery. ? ?Do not use any "recreational" drugs for at least a week prior to your surgery.  ?Please be advised that the combination of cocaine and anesthesia may have negative outcomes, up to and including death. ?If you test positive for cocaine, your surgery will be cancelled. ? ?On the morning of surgery brush your teeth with toothpaste and water, you may rinse your mouth with mouthwash if you wish. ?Do not swallow any toothpaste or mouthwash. ? ?Use CHG Soap or wipes as directed on instruction sheet.-provided for you  ? ?Do not wear jewelry, make-up, hairpins, clips or nail polish. ? ?Do not wear lotions, powders, or perfumes.  ? ?Do not shave body from the neck down 48 hours prior to surgery just in case you cut yourself which could leave a site for infection.  ?Also, freshly shaved skin may become irritated if using the CHG soap. ? ?Contact lenses, hearing aids and dentures may not be worn into surgery. ? ?Do not bring valuables to the hospital. [facility] is not responsible for any missing/lost belongings or valuables.  ? ? ?Notify your doctor if there is any change in your medical condition (cold, fever, infection). ? ?Wear comfortable clothing (specific to your surgery type) to the hospital. ? ?After surgery, you can help prevent lung complications by doing breathing  exercises.  ?Take deep breaths and cough every 1-2 hours. Your doctor may order a device called an Incentive Spirometer to help you take deep breaths. ? ?If you are being admitted to the hospital overnight, leave your suitcase in the car. ?After surgery it may be brought to your room. ? ?If you are being discharged the day of surgery, you will not be allowed to drive home. ?You will need a responsible adult (18  years or older) to drive you home and stay with you that night.  ? ?If you are taking public transportation, you will need to have a responsible adult (18 years or older) with you. ?Please confirm with your physician that it is acceptable to use public transportation.  ? ?Please call the [place] [facility]. at [phone] if you have any questions about these instructions. ? ?Surgery Visitation Policy: ? ?Patients undergoing a surgery or procedure may have two family members or support persons with them as long as the person is not COVID-19 positive or experiencing its symptoms.  ? ?Inpatient Visitation:   ? ?Visiting hours are 7 a.m. to 8 p.m. ?Up to four visitors are allowed at one time in a patient room, including children. The visitors may rotate out with other people during the day. One designated support person (adult) may remain overnight.  ?

## 2021-11-17 ENCOUNTER — Ambulatory Visit
Admission: RE | Admit: 2021-11-17 | Discharge: 2021-11-17 | Disposition: A | Discharge status: Home / Self Care | Payer: Federal, State, Local not specified - PPO | Source: Ambulatory Visit | Attending: Certified Nurse Midwife | Admitting: Certified Nurse Midwife

## 2021-11-17 DIAGNOSIS — Z1231 Encounter for screening mammogram for malignant neoplasm of breast: Secondary | ICD-10-CM | POA: Diagnosis not present

## 2021-11-17 DIAGNOSIS — Z01419 Encounter for gynecological examination (general) (routine) without abnormal findings: Secondary | ICD-10-CM | POA: Insufficient documentation

## 2021-11-17 DIAGNOSIS — F418 Other specified anxiety disorders: Secondary | ICD-10-CM | POA: Diagnosis not present

## 2021-11-23 NOTE — H&P (Signed)
ORTHOPAEDIC HISTORY & PHYSICAL Gwenlyn Fudge, [place] - 11/14/2021 11:00 AM EDT Formatting of this note is different from the original. [facility] Chief Complaint:   Chief Complaint  Patient presents with   H&P-LT TKA- 11/24/21- [facility]   History of Present Illness:   Sierra Moore is a 51 y.o. female that presents to clinic today for her preoperative history and evaluation. Patient presents unaccompanied. The patient is scheduled to undergo a left total knee arthroplasty on 11/24/21 by Dr. Marry Guan. Her pain began many years ago. The pain is located primarily along the medial aspect of the knee. She describes her pain as worse with any weightbearing. She reports associated swelling with some giving way of the knee. She denies associated numbness or tingling, denies locking.   The patient's symptoms have progressed to the point that they decrease her quality of life. The patient has previously undergone conservative treatment including Tylenol and activity modification without adequate control of her symptoms.  Patient states the numbness/tingling in the right foot has resolved at this time.   Denies history of lumbar surgery, significant cardiac history, or history of DVT.  Reports penicillin allergy but IgE within normal limits.  Past Medical, Surgical, Family, Social History, Allergies, Medications:   Past Medical History:  Past Medical History:  Diagnosis Date   Allergy   Facial rash   GERD (gastroesophageal reflux disease)   Hematuria   Premenstrual dysphoric disorder   Past Surgical History:  Past Surgical History:  Procedure Laterality Date   Right total knee arthroplasty using computer-assisted navigation 01/03/2021  Dr Marry Guan   CHOLECYSTECTOMY   TONSILLECTOMY   Current Medications:  Current Outpatient Medications  Medication Sig Dispense Refill   acetaminophen (TYLENOL) 500 MG tablet Take 500 mg by mouth 2 (two) times  daily as needed for Pain   cholecalciferol (VITAMIN D3) 1000 unit tablet Take 1,000 Units by mouth daily.   diclofenac (VOLTAREN) 1 % topical gel Apply 2 g topically 4 (four) times daily   levonorgestrel-ethinyl estradiol (LESSINA) 0.1-20 mg-mcg tablet Take 1 tablet by mouth once daily   omeprazole (PRILOSEC OTC) 20 MG EC tablet Take 20 mg by mouth once daily as needed   No current facility-administered medications for this visit.   Allergies:  Allergies  Allergen Reactions   Penicillins Hives and Rash  Did it involve swelling of the face/tongue/throat, SOB, or low BP? No Did it involve sudden or severe rash/hives, skin peeling, or any reaction on the inside of your mouth or nose? No Did you need to seek medical attention at a hospital or doctor's office? No When did it last happen?     within the last 5 years  If all above answers are "NO", may proceed with cephalosporin use. IgE = 16 (WNL) on 12/25/2020    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   Occupation: Part-time Engineer, water  Tobacco Use   Smoking status: Former  Packs/day: 0.50  Years: 12.00  Pack years: 6.00  Types: Cigarettes  Quit date: 2004  Years since quitting: 19.3   Smokeless tobacco: Never  Vaping Use   Vaping Use: Never used  Substance and Sexual Activity   Alcohol use: No   Drug  use: Never   Sexual activity: Yes  Partners: Male   Family History:  Family History  Problem Relation Age of Onset   Arthritis Mother   Raynaud syndrome Mother   Arthritis Father   Review of Systems:   A 10+ ROS was performed, reviewed, and the pertinent orthopaedic findings are documented in the HPI.   Physical Examination:   BP 130/88 (BP Location: Left upper arm,  Patient Position: Sitting, BP Cuff Size: Large Adult)  Ht 154.9 cm ('5\' 1"'$ )  Wt 95.7 kg (211 lb)  BMI 39.87 kg/m   Patient is a well-developed, well-nourished female in no acute distress. Patient has normal mood and affect. Patient is alert and oriented to person, place, and time.   HEENT: Atraumatic, normocephalic. Pupils equal and reactive to light. Extraocular motion intact. Noninjected sclera.  Cardiovascular: Regular rate and rhythm, with no murmurs, rubs, or gallops. Distal pulses palpable. Carotid bruits.  Respiratory: Lungs clear to auscultation bilaterally.   Left Knee:  Soft tissue swelling: minimal Effusion: none Erythema: none Crepitance: mild Tenderness: medial joint line Alignment: relative varus Mediolateral laxity: medial pseudolaxity Anterior drawer test:negative Lachman`s test: negative McMurray`s test: negative Atrophy: No significant atrophy.  Quadriceps tone was fair to good. Range of Motion: 0/7/117 degrees  Able to actively plantarflex and dorsiflex ankle. Able to flex and extend the toes.  Sensation intact over the saphenous, lateral sural cutaneous, superficial fibular, and deep fibular nerve distributions.  Tests Performed/Reviewed:  X-rays  3 views of the left knee were obtained. Images reveal severe loss of medial compartment joint space with osteophyte formation. No fractures or dislocations. No other osseous abnormality noted  I personally ordered and interpreted today's x-rays.  Impression:   ICD-10-CM  1. Left knee pain, unspecified chronicity M25.562   Plan:   The patient has end-stage degenerative changes of the left knee. It was explained to the patient that the condition is progressive in nature. Having failed conservative treatment, the patient has elected to proceed with a total joint arthroplasty. The patient will undergo a total joint arthroplasty with Dr. Marry Guan. The risks of surgery, including blood clot and infection, were  discussed with the patient. Measures to reduce these risks, including the use of anticoagulation, perioperative antibiotics, and early ambulation were discussed. The importance of postoperative physical therapy was discussed with the patient. The patient elects to proceed with surgery. The patient is instructed to stop all blood thinners prior to surgery. The patient is instructed to call the hospital the day before surgery to learn of the proper arrival time.   Contact our office with any questions or concerns. Follow up as indicated, or sooner should any new problems arise, if conditions worsen, or if they are otherwise concerned.   Gwenlyn Fudge, [facility] [place], [place] [address] Phone: [phone]  This note was generated in part with voice recognition software and I apologize for any typographical errors that were not detected and corrected.  Electronically signed by Gwenlyn Fudge, PA at 11/14/2021 12:45 PM EDT

## 2021-11-24 ENCOUNTER — Other Ambulatory Visit: Payer: Self-pay

## 2021-11-24 ENCOUNTER — Encounter: Payer: Self-pay | Admitting: Orthopedic Surgery

## 2021-11-24 ENCOUNTER — Ambulatory Visit: Payer: Federal, State, Local not specified - PPO | Admitting: Urgent Care

## 2021-11-24 ENCOUNTER — Observation Stay: Payer: Federal, State, Local not specified - PPO

## 2021-11-24 ENCOUNTER — Ambulatory Visit: Payer: Federal, State, Local not specified - PPO | Admitting: Anesthesiology

## 2021-11-24 ENCOUNTER — Observation Stay
Admission: RE | Admit: 2021-11-24 | Discharge: 2021-11-25 | Disposition: A | Discharge status: Home Health | Payer: Federal, State, Local not specified - PPO | Attending: Orthopedic Surgery | Admitting: Orthopedic Surgery

## 2021-11-24 ENCOUNTER — Encounter
Admission: RE | Disposition: A | Discharge status: Home Health | Payer: Self-pay | Source: Home / Self Care | Attending: Orthopedic Surgery

## 2021-11-24 DIAGNOSIS — Z96652 Presence of left artificial knee joint: Secondary | ICD-10-CM | POA: Diagnosis not present

## 2021-11-24 DIAGNOSIS — M1712 Unilateral primary osteoarthritis, left knee: Principal | ICD-10-CM | POA: Insufficient documentation

## 2021-11-24 DIAGNOSIS — E669 Obesity, unspecified: Secondary | ICD-10-CM

## 2021-11-24 DIAGNOSIS — R319 Hematuria, unspecified: Secondary | ICD-10-CM

## 2021-11-24 DIAGNOSIS — Z96651 Presence of right artificial knee joint: Secondary | ICD-10-CM | POA: Insufficient documentation

## 2021-11-24 DIAGNOSIS — Z96659 Presence of unspecified artificial knee joint: Secondary | ICD-10-CM

## 2021-11-24 DIAGNOSIS — E785 Hyperlipidemia, unspecified: Secondary | ICD-10-CM | POA: Diagnosis not present

## 2021-11-24 DIAGNOSIS — I7300 Raynaud's syndrome without gangrene: Secondary | ICD-10-CM

## 2021-11-24 DIAGNOSIS — Z87891 Personal history of nicotine dependence: Secondary | ICD-10-CM | POA: Insufficient documentation

## 2021-11-24 DIAGNOSIS — Z79899 Other long term (current) drug therapy: Secondary | ICD-10-CM | POA: Insufficient documentation

## 2021-11-24 DIAGNOSIS — Z87898 Personal history of other specified conditions: Secondary | ICD-10-CM

## 2021-11-24 DIAGNOSIS — M1711 Unilateral primary osteoarthritis, right knee: Secondary | ICD-10-CM | POA: Diagnosis not present

## 2021-11-24 HISTORY — PX: KNEE ARTHROPLASTY: SHX992

## 2021-11-24 LAB — POCT PREGNANCY, URINE: Preg Test, Ur: NEGATIVE

## 2021-11-24 SURGERY — ARTHROPLASTY, KNEE, TOTAL, USING IMAGELESS COMPUTER-ASSISTED NAVIGATION
Anesthesia: Spinal | Site: Knee | Laterality: Left

## 2021-11-24 MED ORDER — BUPIVACAINE HCL (PF) 0.5 % IJ SOLN
INTRAMUSCULAR | Status: DC | PRN
  Administered 2021-11-24: 3 mL via INTRATHECAL

## 2021-11-24 MED ORDER — PHENYLEPHRINE HCL-NACL 20-0.9 MG/250ML-% IV SOLN
INTRAVENOUS | Status: DC | PRN
  Administered 2021-11-24: 25 ug/min via INTRAVENOUS

## 2021-11-24 MED ORDER — CEFAZOLIN SODIUM-DEXTROSE 2-4 GM/100ML-% IV SOLN
INTRAVENOUS | Status: AC
  Filled 2021-11-24: qty 100

## 2021-11-24 MED ORDER — ONDANSETRON HCL 4 MG/2ML IJ SOLN: 4.0000 mg | Freq: Four times a day (QID) | INTRAMUSCULAR | Status: DC | PRN

## 2021-11-24 MED ORDER — FERROUS SULFATE 325 (65 FE) MG PO TABS
325.0000 mg | ORAL_TABLET | Freq: Two times a day (BID) | ORAL | Status: DC
  Administered 2021-11-24 – 2021-11-25 (×2): 325 mg via ORAL
  Filled 2021-11-24 (×2): qty 1

## 2021-11-24 MED ORDER — PROPOFOL 1000 MG/100ML IV EMUL
INTRAVENOUS | Status: AC
  Filled 2021-11-24: qty 100

## 2021-11-24 MED ORDER — FENTANYL CITRATE (PF) 100 MCG/2ML IJ SOLN: 25.0000 ug | INTRAMUSCULAR | Status: DC | PRN

## 2021-11-24 MED ORDER — PROPOFOL 500 MG/50ML IV EMUL
INTRAVENOUS | Status: DC | PRN
  Administered 2021-11-24: 140 ug/kg/min via INTRAVENOUS

## 2021-11-24 MED ORDER — BUPIVACAINE LIPOSOME 1.3 % IJ SUSP
INTRAMUSCULAR | Status: DC | PRN
  Administered 2021-11-24: 60 mL

## 2021-11-24 MED ORDER — GABAPENTIN 300 MG PO CAPS: 300.0000 mg | ORAL_CAPSULE | Freq: Once | ORAL | Status: DC

## 2021-11-24 MED ORDER — SODIUM CHLORIDE 0.9 % IR SOLN
Status: DC | PRN
  Administered 2021-11-24: 3000 mL

## 2021-11-24 MED ORDER — OXYCODONE HCL 5 MG PO TABS
10.0000 mg | ORAL_TABLET | ORAL | Status: DC | PRN
  Administered 2021-11-25: 10 mg via ORAL
  Filled 2021-11-24: qty 2

## 2021-11-24 MED ORDER — GLYCOPYRROLATE 0.2 MG/ML IJ SOLN
INTRAMUSCULAR | Status: DC | PRN
  Administered 2021-11-24: .2 mg via INTRAVENOUS

## 2021-11-24 MED ORDER — TRANEXAMIC ACID-NACL 1000-0.7 MG/100ML-% IV SOLN
INTRAVENOUS | Status: DC | PRN
  Administered 2021-11-24: 1000 mg via INTRAVENOUS

## 2021-11-24 MED ORDER — ENSURE PRE-SURGERY PO LIQD
296.0000 mL | Freq: Once | ORAL | Status: DC
  Filled 2021-11-24: qty 296

## 2021-11-24 MED ORDER — LACTATED RINGERS IV SOLN: INTRAVENOUS | Status: DC

## 2021-11-24 MED ORDER — OXYCODONE HCL 5 MG PO TABS
5.0000 mg | ORAL_TABLET | ORAL | Status: DC | PRN
  Administered 2021-11-24: 5 mg via ORAL
  Filled 2021-11-24: qty 1

## 2021-11-24 MED ORDER — HYDROMORPHONE HCL 1 MG/ML IJ SOLN: 0.5000 mg | INTRAMUSCULAR | Status: DC | PRN

## 2021-11-24 MED ORDER — BISACODYL 10 MG RE SUPP: 10.0000 mg | Freq: Every day | RECTAL | Status: DC | PRN

## 2021-11-24 MED ORDER — ONDANSETRON HCL 4 MG PO TABS
4.0000 mg | ORAL_TABLET | Freq: Four times a day (QID) | ORAL | Status: DC | PRN
  Administered 2021-11-24 – 2021-11-25 (×2): 4 mg via ORAL
  Filled 2021-11-24 (×2): qty 1

## 2021-11-24 MED ORDER — CEFAZOLIN SODIUM-DEXTROSE 2-4 GM/100ML-% IV SOLN
2.0000 g | Freq: Four times a day (QID) | INTRAVENOUS | Status: AC
  Administered 2021-11-24 – 2021-11-25 (×2): 2 g via INTRAVENOUS
  Filled 2021-11-24 (×2): qty 100

## 2021-11-24 MED ORDER — CELECOXIB 200 MG PO CAPS
200.0000 mg | ORAL_CAPSULE | Freq: Two times a day (BID) | ORAL | Status: DC
  Administered 2021-11-24 – 2021-11-25 (×2): 200 mg via ORAL
  Filled 2021-11-24 (×2): qty 1

## 2021-11-24 MED ORDER — MIDAZOLAM HCL 5 MG/5ML IJ SOLN
INTRAMUSCULAR | Status: DC | PRN
  Administered 2021-11-24: 2 mg via INTRAVENOUS

## 2021-11-24 MED ORDER — ENOXAPARIN SODIUM 30 MG/0.3ML IJ SOSY
30.0000 mg | PREFILLED_SYRINGE | Freq: Two times a day (BID) | INTRAMUSCULAR | Status: DC
  Administered 2021-11-25: 30 mg via SUBCUTANEOUS
  Filled 2021-11-24: qty 0.3

## 2021-11-24 MED ORDER — MAGNESIUM HYDROXIDE 400 MG/5ML PO SUSP
30.0000 mL | Freq: Every day | ORAL | Status: DC
  Filled 2021-11-24: qty 30

## 2021-11-24 MED ORDER — ALUM & MAG HYDROXIDE-SIMETH 200-200-20 MG/5ML PO SUSP: 30.0000 mL | ORAL | Status: DC | PRN

## 2021-11-24 MED ORDER — OXYCODONE HCL 5 MG PO TABS: 5.0000 mg | ORAL_TABLET | Freq: Once | ORAL | Status: DC | PRN

## 2021-11-24 MED ORDER — PANTOPRAZOLE SODIUM 40 MG PO TBEC
40.0000 mg | DELAYED_RELEASE_TABLET | Freq: Two times a day (BID) | ORAL | Status: DC
  Administered 2021-11-24 – 2021-11-25 (×2): 40 mg via ORAL
  Filled 2021-11-24 (×2): qty 1

## 2021-11-24 MED ORDER — ACETAMINOPHEN 325 MG PO TABS: 325.0000 mg | ORAL_TABLET | Freq: Four times a day (QID) | ORAL | Status: DC | PRN

## 2021-11-24 MED ORDER — ACETAMINOPHEN 10 MG/ML IV SOLN
1000.0000 mg | Freq: Four times a day (QID) | INTRAVENOUS | Status: AC
Start: 2021-11-24 — End: 2021-11-25
  Administered 2021-11-24 – 2021-11-25 (×3): 1000 mg via INTRAVENOUS
  Filled 2021-11-24 (×3): qty 100

## 2021-11-24 MED ORDER — METOCLOPRAMIDE HCL 10 MG PO TABS
10.0000 mg | ORAL_TABLET | Freq: Three times a day (TID) | ORAL | Status: DC
  Administered 2021-11-24 – 2021-11-25 (×4): 10 mg via ORAL
  Filled 2021-11-24 (×8): qty 1

## 2021-11-24 MED ORDER — CEFAZOLIN SODIUM-DEXTROSE 2-4 GM/100ML-% IV SOLN: 2.0000 g | INTRAVENOUS | Status: DC

## 2021-11-24 MED ORDER — SENNOSIDES-DOCUSATE SODIUM 8.6-50 MG PO TABS
1.0000 | ORAL_TABLET | Freq: Two times a day (BID) | ORAL | Status: DC
Start: 2021-11-24 — End: 2021-11-25
  Administered 2021-11-24 – 2021-11-25 (×2): 1 via ORAL
  Filled 2021-11-24 (×2): qty 1

## 2021-11-24 MED ORDER — DIPHENHYDRAMINE HCL 12.5 MG/5ML PO ELIX: 12.5000 mg | ORAL_SOLUTION | ORAL | Status: DC | PRN

## 2021-11-24 MED ORDER — ORAL CARE MOUTH RINSE: 15.0000 mL | Freq: Once | OROMUCOSAL | Status: AC

## 2021-11-24 MED ORDER — BUPIVACAINE HCL (PF) 0.25 % IJ SOLN
INTRAMUSCULAR | Status: DC | PRN
  Administered 2021-11-24: 60 mL

## 2021-11-24 MED ORDER — ONDANSETRON HCL 4 MG/2ML IJ SOLN
INTRAMUSCULAR | Status: DC | PRN
  Administered 2021-11-24: 4 mg via INTRAVENOUS

## 2021-11-24 MED ORDER — TRANEXAMIC ACID-NACL 1000-0.7 MG/100ML-% IV SOLN
INTRAVENOUS | Status: AC
  Filled 2021-11-24: qty 100

## 2021-11-24 MED ORDER — PHENYLEPHRINE HCL-NACL 20-0.9 MG/250ML-% IV SOLN
INTRAVENOUS | Status: AC
  Filled 2021-11-24: qty 250

## 2021-11-24 MED ORDER — PROPOFOL 10 MG/ML IV BOLUS
INTRAVENOUS | Status: AC
  Filled 2021-11-24: qty 20

## 2021-11-24 MED ORDER — SODIUM CHLORIDE 0.9 % IV SOLN: INTRAVENOUS | Status: DC

## 2021-11-24 MED ORDER — 0.9 % SODIUM CHLORIDE (POUR BTL) OPTIME
TOPICAL | Status: DC | PRN
  Administered 2021-11-24: 500 mL

## 2021-11-24 MED ORDER — FENTANYL CITRATE (PF) 100 MCG/2ML IJ SOLN
INTRAMUSCULAR | Status: AC
Start: 2021-11-24 — End: ?
  Filled 2021-11-24: qty 2

## 2021-11-24 MED ORDER — MIDAZOLAM HCL 2 MG/2ML IJ SOLN
INTRAMUSCULAR | Status: AC
  Filled 2021-11-24: qty 2

## 2021-11-24 MED ORDER — DEXAMETHASONE SODIUM PHOSPHATE 10 MG/ML IJ SOLN: 8.0000 mg | Freq: Once | INTRAMUSCULAR | Status: DC

## 2021-11-24 MED ORDER — CELECOXIB 200 MG PO CAPS: 400.0000 mg | ORAL_CAPSULE | Freq: Once | ORAL | Status: DC

## 2021-11-24 MED ORDER — LEVONORGESTREL-ETHINYL ESTRAD 0.1-20 MG-MCG PO TABS: 1.0000 | ORAL_TABLET | Freq: Every day | ORAL | Status: DC

## 2021-11-24 MED ORDER — ACETAMINOPHEN 10 MG/ML IV SOLN
INTRAVENOUS | Status: AC
  Filled 2021-11-24: qty 100

## 2021-11-24 MED ORDER — TRAMADOL HCL 50 MG PO TABS
50.0000 mg | ORAL_TABLET | ORAL | Status: DC | PRN
  Administered 2021-11-24 – 2021-11-25 (×2): 100 mg via ORAL
  Filled 2021-11-24 (×2): qty 2

## 2021-11-24 MED ORDER — OXYCODONE HCL 5 MG/5ML PO SOLN: 5.0000 mg | Freq: Once | ORAL | Status: DC | PRN

## 2021-11-24 MED ORDER — PHENOL 1.4 % MT LIQD: 1.0000 | OROMUCOSAL | Status: DC | PRN

## 2021-11-24 MED ORDER — CHLORHEXIDINE GLUCONATE 4 % EX LIQD: 60.0000 mL | Freq: Once | CUTANEOUS | Status: DC

## 2021-11-24 MED ORDER — DEXMEDETOMIDINE (PRECEDEX) IN NS 20 MCG/5ML (4 MCG/ML) IV SYRINGE
PREFILLED_SYRINGE | INTRAVENOUS | Status: DC | PRN
  Administered 2021-11-24: 8 ug via INTRAVENOUS

## 2021-11-24 MED ORDER — PHENYLEPHRINE HCL (PRESSORS) 10 MG/ML IV SOLN
INTRAVENOUS | Status: DC | PRN
  Administered 2021-11-24: 80 ug via INTRAVENOUS

## 2021-11-24 MED ORDER — NEOMYCIN-POLYMYXIN B GU 40-200000 IR SOLN
Status: DC | PRN
  Administered 2021-11-24: 14 mL

## 2021-11-24 MED ORDER — CHLORHEXIDINE GLUCONATE 0.12 % MT SOLN: 15.0000 mL | Freq: Once | OROMUCOSAL | Status: AC

## 2021-11-24 MED ORDER — CEFAZOLIN SODIUM-DEXTROSE 2-3 GM-%(50ML) IV SOLR
INTRAVENOUS | Status: DC | PRN
  Administered 2021-11-24: 2 g via INTRAVENOUS

## 2021-11-24 MED ORDER — SURGIPHOR WOUND IRRIGATION SYSTEM - OPTIME
TOPICAL | Status: DC | PRN
  Administered 2021-11-24: 1 via TOPICAL

## 2021-11-24 MED ORDER — FLEET ENEMA 7-19 GM/118ML RE ENEM
1.0000 | ENEMA | Freq: Once | RECTAL | Status: DC | PRN
Start: 2021-11-24 — End: 2021-11-25

## 2021-11-24 MED ORDER — GABAPENTIN 300 MG PO CAPS
ORAL_CAPSULE | ORAL | Status: AC
  Administered 2021-11-24: 300 mg via ORAL
  Filled 2021-11-24: qty 1

## 2021-11-24 MED ORDER — CHLORHEXIDINE GLUCONATE 0.12 % MT SOLN
OROMUCOSAL | Status: AC
  Administered 2021-11-24: 15 mL via OROMUCOSAL
  Filled 2021-11-24: qty 15

## 2021-11-24 MED ORDER — LIDOCAINE HCL (CARDIAC) PF 100 MG/5ML IV SOSY
PREFILLED_SYRINGE | INTRAVENOUS | Status: DC | PRN
  Administered 2021-11-24: 60 mg via INTRAVENOUS

## 2021-11-24 MED ORDER — TRANEXAMIC ACID-NACL 1000-0.7 MG/100ML-% IV SOLN
1000.0000 mg | Freq: Once | INTRAVENOUS | Status: AC
  Administered 2021-11-24: 1000 mg via INTRAVENOUS

## 2021-11-24 MED ORDER — CELECOXIB 200 MG PO CAPS
ORAL_CAPSULE | ORAL | Status: AC
  Administered 2021-11-24: 400 mg via ORAL
  Filled 2021-11-24: qty 2

## 2021-11-24 MED ORDER — TRANEXAMIC ACID-NACL 1000-0.7 MG/100ML-% IV SOLN: 1000.0000 mg | INTRAVENOUS | Status: DC

## 2021-11-24 MED ORDER — ACETAMINOPHEN 10 MG/ML IV SOLN
INTRAVENOUS | Status: DC | PRN
Start: 2021-11-24 — End: 2021-11-24
  Administered 2021-11-24: 1000 mg via INTRAVENOUS

## 2021-11-24 MED ORDER — PROPOFOL 10 MG/ML IV BOLUS
INTRAVENOUS | Status: DC | PRN
  Administered 2021-11-24: 70 mg via INTRAVENOUS

## 2021-11-24 MED ORDER — VITAMIN D 25 MCG (1000 UNIT) PO TABS
1000.0000 [IU] | ORAL_TABLET | Freq: Every day | ORAL | Status: DC
  Administered 2021-11-25: 1000 [IU] via ORAL
  Filled 2021-11-24 (×2): qty 1

## 2021-11-24 MED ORDER — MENTHOL 3 MG MT LOZG: 1.0000 | LOZENGE | OROMUCOSAL | Status: DC | PRN

## 2021-11-24 MED ORDER — DEXAMETHASONE SODIUM PHOSPHATE 10 MG/ML IJ SOLN
INTRAMUSCULAR | Status: AC
  Administered 2021-11-24: 8 mg via INTRAVENOUS
  Filled 2021-11-24: qty 1

## 2021-11-24 SURGICAL SUPPLY — 73 items
ATTUNE MED DOME PAT 32 KNEE (Knees) ×1 IMPLANT
ATTUNE PSFEM LTSZ4 NARCEM KNEE (Femur) ×1 IMPLANT
ATTUNE PSRP INSR SZ4 5 KNEE (Insert) ×1 IMPLANT
BASE TIBIAL ROT PLAT SZ 3 KNEE (Knees) IMPLANT
BATTERY INSTRU NAVIGATION (MISCELLANEOUS) ×8 IMPLANT
BLADE SAW 70X12.5 (BLADE) ×2 IMPLANT
BLADE SAW 90X13X1.19 OSCILLAT (BLADE) ×2 IMPLANT
BLADE SAW 90X25X1.19 OSCILLAT (BLADE) ×2 IMPLANT
BONE CEMENT GENTAMICIN (Cement) ×4 IMPLANT
CEMENT BONE GENTAMICIN 40 (Cement) IMPLANT
COOLER POLAR GLACIER W/PUMP (MISCELLANEOUS) ×2 IMPLANT
CUFF TOURN SGL QUICK 24 (TOURNIQUET CUFF)
CUFF TOURN SGL QUICK 34 (TOURNIQUET CUFF)
CUFF TRNQT CYL 24X4X16.5-23 (TOURNIQUET CUFF) IMPLANT
CUFF TRNQT CYL 34X4.125X (TOURNIQUET CUFF) IMPLANT
DRAPE 3/4 80X56 (DRAPES) ×2 IMPLANT
DRAPE INCISE IOBAN 66X45 STRL (DRAPES) IMPLANT
DRSG DERMACEA 8X12 NADH (GAUZE/BANDAGES/DRESSINGS) ×2 IMPLANT
DRSG MEPILEX SACRM 8.7X9.8 (GAUZE/BANDAGES/DRESSINGS) ×2 IMPLANT
DRSG OPSITE POSTOP 4X14 (GAUZE/BANDAGES/DRESSINGS) ×2 IMPLANT
DRSG TEGADERM 4X4.75 (GAUZE/BANDAGES/DRESSINGS) ×2 IMPLANT
DURAPREP 26ML APPLICATOR (WOUND CARE) ×4 IMPLANT
ELECT CAUTERY BLADE 6.4 (BLADE) ×2 IMPLANT
ELECT REM PT RETURN 9FT ADLT (ELECTROSURGICAL) ×2
ELECTRODE REM PT RTRN 9FT ADLT (ELECTROSURGICAL) ×1 IMPLANT
EX-PIN ORTHOLOCK NAV 4X150 (PIN) ×4 IMPLANT
GLOVE BIOGEL M STRL SZ7.5 (GLOVE) ×8 IMPLANT
GLOVE BIOGEL PI IND STRL 8 (GLOVE) ×1 IMPLANT
GLOVE BIOGEL PI INDICATOR 8 (GLOVE) ×1
GLOVE SURG UNDER POLY LF SZ7.5 (GLOVE) ×2 IMPLANT
GOWN STRL REUS W/ TWL LRG LVL3 (GOWN DISPOSABLE) ×2 IMPLANT
GOWN STRL REUS W/ TWL XL LVL3 (GOWN DISPOSABLE) ×1 IMPLANT
GOWN STRL REUS W/TWL LRG LVL3 (GOWN DISPOSABLE) ×2
GOWN STRL REUS W/TWL XL LVL3 (GOWN DISPOSABLE) ×1
HEMOVAC 400CC 10FR (MISCELLANEOUS) ×2 IMPLANT
HOLDER FOLEY CATH W/STRAP (MISCELLANEOUS) ×2 IMPLANT
HOLSTER ELECTROSUGICAL PENCIL (MISCELLANEOUS) ×2 IMPLANT
HOOD PEEL AWAY FLYTE STAYCOOL (MISCELLANEOUS) ×4 IMPLANT
IV NS IRRIG 3000ML ARTHROMATIC (IV SOLUTION) ×2 IMPLANT
KIT TURNOVER KIT A (KITS) ×2 IMPLANT
KNIFE SCULPS 14X20 (INSTRUMENTS) ×2 IMPLANT
MANIFOLD NEPTUNE II (INSTRUMENTS) ×4 IMPLANT
NDL SPNL 20GX3.5 QUINCKE YW (NEEDLE) ×2 IMPLANT
NEEDLE SPNL 20GX3.5 QUINCKE YW (NEEDLE) ×4 IMPLANT
NS IRRIG 500ML POUR BTL (IV SOLUTION) ×2 IMPLANT
PACK TOTAL KNEE (MISCELLANEOUS) ×2 IMPLANT
PAD ABD DERMACEA PRESS 5X9 (GAUZE/BANDAGES/DRESSINGS) ×4 IMPLANT
PAD WRAPON POLOR MULTI XL (MISCELLANEOUS) IMPLANT
PIN DRILL FIX HALF THREAD (BIT) ×4 IMPLANT
PIN DRILL QUICK PACK ×4 IMPLANT
PIN FIXATION 1/8DIA X 3INL (PIN) ×2 IMPLANT
PULSAVAC PLUS IRRIG FAN TIP (DISPOSABLE) ×2
SOL PREP PVP 2OZ (MISCELLANEOUS) ×2
SOLUTION IRRIG SURGIPHOR (IV SOLUTION) ×2 IMPLANT
SOLUTION PREP PVP 2OZ (MISCELLANEOUS) ×1 IMPLANT
SPONGE DRAIN TRACH 4X4 STRL 2S (GAUZE/BANDAGES/DRESSINGS) ×2 IMPLANT
STAPLER SKIN PROX 35W (STAPLE) ×2 IMPLANT
STOCKINETTE IMPERV 14X48 (MISCELLANEOUS) ×1 IMPLANT
STRAP TIBIA SHORT (MISCELLANEOUS) ×2 IMPLANT
SUCTION FRAZIER HANDLE 10FR (MISCELLANEOUS) ×1
SUCTION TUBE FRAZIER 10FR DISP (MISCELLANEOUS) ×1 IMPLANT
SUT VIC AB 0 CT1 36 (SUTURE) ×4 IMPLANT
SUT VIC AB 1 CT1 36 (SUTURE) ×4 IMPLANT
SUT VIC AB 2-0 CT2 27 (SUTURE) ×2 IMPLANT
SYR 30ML LL (SYRINGE) ×4 IMPLANT
TIBIAL BASE ROT PLAT SZ 3 KNEE (Knees) ×2 IMPLANT
TIP FAN IRRIG PULSAVAC PLUS (DISPOSABLE) ×1 IMPLANT
TOWEL OR 17X26 4PK STRL BLUE (TOWEL DISPOSABLE) IMPLANT
TOWER CARTRIDGE SMART MIX (DISPOSABLE) ×2 IMPLANT
TRAY FOLEY MTR SLVR 16FR STAT (SET/KITS/TRAYS/PACK) ×2 IMPLANT
WATER STERILE IRR 1000ML POUR (IV SOLUTION) ×2 IMPLANT
WRAP-ON POLOR PAD MULTI XL (MISCELLANEOUS) ×1
WRAPON POLOR PAD MULTI XL (MISCELLANEOUS) ×1

## 2021-11-24 NOTE — Anesthesia Procedure Notes (Signed)
Date/Time: 11/24/2021 11:25 AM Performed by: Johnna Acosta, CRNA Pre-anesthesia Checklist: Patient identified, Emergency Drugs available, Suction available, Patient being monitored and Timeout performed Patient Re-evaluated:Patient Re-evaluated prior to induction Oxygen Delivery Method: Simple face mask Preoxygenation: Pre-oxygenation with 100% oxygen Induction Type: IV induction

## 2021-11-24 NOTE — Anesthesia Preprocedure Evaluation (Signed)
Anesthesia Evaluation  Patient identified by MRN, date of birth, ID band Patient awake    Reviewed: Allergy & Precautions, NPO status , Patient's Chart, lab work & pertinent test results  History of Anesthesia Complications Negative for: history of anesthetic complications  Airway Mallampati: III  TM Distance: <3 FB Neck ROM: full    Dental  (+) Chipped   Pulmonary neg shortness of breath, former smoker,    Pulmonary exam normal        Cardiovascular Exercise Tolerance: Good (-) angina(-) Past MI negative cardio ROS Normal cardiovascular exam     Neuro/Psych negative neurological ROS  negative psych ROS   GI/Hepatic Neg liver ROS, GERD  Controlled,  Endo/Other  Morbid obesity  Renal/GU      Musculoskeletal   Abdominal   Peds  Hematology negative hematology ROS (+)   Anesthesia Other Findings Past Medical History: No date: Allergy No date: Arthritis     Comment:  POLYARTHRALGIA No date: GERD (gastroesophageal reflux disease)     Comment:  occ No date: History of kidney stones     Comment:  currently as of 12-25-20 No date: Leg swelling No date: Obesity No date: PMDD (premenstrual dysphoric disorder) No date: PMDD (premenstrual dysphoric disorder) No date: Raynaud disease No date: Vitamin D deficiency  Past Surgical History: 04/27/2019: COLONOSCOPY WITH PROPOFOL; N/A     Comment:  Procedure: COLONOSCOPY WITH BIOPSY;  Surgeon: Lucilla Lame, MD;  Location: [facility];  Service:               Endoscopy;  Laterality: N/A; 04/27/2019: ESOPHAGOGASTRODUODENOSCOPY (EGD) WITH PROPOFOL; N/A     Comment:  Procedure: ESOPHAGOGASTRODUODENOSCOPY (EGD) WITH               PROPOFOL;  Surgeon: Lucilla Lame, MD;  Location: [facility];  Service: Endoscopy;  Laterality: N/A; 01/03/2021: KNEE ARTHROPLASTY; Right     Comment:  Procedure: COMPUTER ASSISTED TOTAL KNEE ARTHROPLASTY -                RNFA;  Surgeon: Dereck Leep, MD;  Location: [facility];              Service: Orthopedics;  Laterality: Right; 04/27/2019: POLYPECTOMY; N/A     Comment:  Procedure: POLYPECTOMY;  Surgeon: Lucilla Lame, MD;                Location: [facility];  Service: Endoscopy;                Laterality: N/A; 01/10/2019: ROBOTIC ASSISTED LAPAROSCOPIC CHOLECYSTECTOMY; N/A     Comment:  Procedure: ROBOTIC ASSISTED LAPAROSCOPIC               CHOLECYSTECTOMY;  Surgeon: Jules Husbands, MD;  Location:              [facility];  Service: General;  Laterality: N/A; No date: TONSILLECTOMY  BMI    Body Mass Index: 40.45 kg/m      Reproductive/Obstetrics negative OB ROS                             Anesthesia Physical Anesthesia Plan  ASA: 3  Anesthesia Plan: Spinal   Post-op Pain Management:    Induction:   PONV Risk Score and Plan:  Airway Management Planned: Natural Airway and Nasal Cannula  Additional Equipment:   Intra-op Plan:   Post-operative Plan:   Informed Consent: I have reviewed the patients History and Physical, chart, labs and discussed the procedure including the risks, benefits and alternatives for the proposed anesthesia with the patient or authorized representative who has indicated his/her understanding and acceptance.     Dental Advisory Given  Plan Discussed with: Anesthesiologist, CRNA and Surgeon  Anesthesia Plan Comments: (Patient reports no bleeding problems and no anticoagulant use.  Plan for spinal with backup GA  Patient consented for risks of anesthesia including but not limited to:  - adverse reactions to medications - damage to eyes, teeth, lips or other oral mucosa - nerve damage due to positioning  - risk of bleeding, infection and or nerve damage from spinal that could lead to paralysis - risk of headache or failed spinal - damage to teeth, lips or other oral mucosa - sore throat or hoarseness - damage  to heart, brain, nerves, lungs, other parts of body or loss of life  Patient voiced understanding.)        Anesthesia Quick Evaluation

## 2021-11-24 NOTE — Anesthesia Procedure Notes (Addendum)
Spinal  Patient location during procedure: OR Start time: 11/24/2021 11:20 AM End time: 11/24/2021 11:24 AM Reason for block: surgical anesthesia Staffing Performed: resident/CRNA  Anesthesiologist: Piscitello, Precious Haws, MD Resident/CRNA: Hedda Slade, CRNA Preanesthetic Checklist Completed: patient identified, IV checked, site marked, risks and benefits discussed, surgical consent, monitors and equipment checked, pre-op evaluation and timeout performed Spinal Block Patient position: sitting Prep: ChloraPrep Patient monitoring: heart rate, continuous pulse ox, blood pressure and cardiac monitor Approach: midline Location: L3-4 Injection technique: single-shot Needle Needle type: Whitacre and Introducer  Needle gauge: 24 G Needle length: 9 cm Assessment Sensory level: T10 Events: CSF return Additional Notes Sterile aseptic technique used throughout the procedure.  Negative paresthesia. Negative blood return. Positive free-flowing CSF. Expiration date of kit checked and confirmed. Patient tolerated procedure well, without complications.

## 2021-11-24 NOTE — Progress Notes (Signed)
Pt expressed that she cannot tolerate zero bone foam. Modified by placing rolling towel under heel of the surgical knee.

## 2021-11-24 NOTE — Transfer of Care (Signed)
Immediate Anesthesia Transfer of Care Note  Patient: Sierra Moore  Procedure(s) Performed: COMPUTER ASSISTED TOTAL KNEE ARTHROPLASTY (Left: Knee)  Patient Location: PACU  Anesthesia Type:Spinal  Level of Consciousness: sedated  Airway & Oxygen Therapy: Patient Spontanous Breathing and Patient connected to face mask oxygen  Post-op Assessment: Report given to RN and Post -op Vital signs reviewed and stable  Post vital signs: Reviewed and stable  Last Vitals:  Vitals Value Taken Time  BP 104/35 11/24/21 1529  Temp    Pulse 75 11/24/21 1529  Resp 21 11/24/21 1529  SpO2 97 % 11/24/21 1529    Last Pain:  Vitals:   11/24/21 0957  PainSc: 0-No pain         Complications: No notable events documented.

## 2021-11-24 NOTE — Progress Notes (Signed)
Patient arrived chewing gum, informed anesthesiologist, Joe Piscetello.

## 2021-11-24 NOTE — H&P (Signed)
The patient has been re-examined, and the chart reviewed, and there have been no interval changes to the documented history and physical.    The risks, benefits, and alternatives have been discussed at length. The patient expressed understanding of the risks benefits and agreed with plans for surgical intervention.  Leslie Jester P. Shloimy Michalski, Jr. M.D.    

## 2021-11-24 NOTE — CV Procedure (Signed)
OPERATIVE NOTE  DATE OF SURGERY:  11/24/2021  PATIENT NAME:  Sierra Moore   DOB: 53-Sep-1972  MRN: [id number]  PRE-OPERATIVE DIAGNOSIS: Degenerative arthrosis of the left knee, primary  POST-OPERATIVE DIAGNOSIS:  Same  PROCEDURE:  Left total knee arthroplasty using computer-assisted navigation  SURGEON:  Marciano Sequin. M.D.  ASSISTANT: Cassell Smiles, PA-C (present and scrubbed throughout the case, critical for assistance with exposure, retraction, instrumentation, and closure)  ANESTHESIA: spinal  ESTIMATED BLOOD LOSS: 50 mL  FLUIDS REPLACED: 900 mL of crystalloid  TOURNIQUET TIME: 105 minutes  DRAINS: 2 medium Hemovac drains  SOFT TISSUE RELEASES: Anterior cruciate ligament, posterior cruciate ligament, deep medial collateral ligament, patellofemoral ligament  IMPLANTS UTILIZED: DePuy Attune size 4N posterior stabilized femoral component (cemented), size 3 rotating platform tibial component (cemented), 32 mm medialized dome patella (cemented), and a 5 mm stabilized rotating platform polyethylene insert.  INDICATIONS FOR SURGERY: Sierra Moore is a 51 y.o. year old female with a long history of progressive knee pain. X-rays demonstrated severe degenerative changes in tricompartmental fashion. The patient had not seen any significant improvement despite conservative nonsurgical intervention. After discussion of the risks and benefits of surgical intervention, the patient expressed understanding of the risks benefits and agree with plans for total knee arthroplasty.   The risks, benefits, and alternatives were discussed at length including but not limited to the risks of infection, bleeding, nerve injury, stiffness, blood clots, the need for revision surgery, cardiopulmonary complications, among others, and they were willing to proceed.  PROCEDURE IN DETAIL: The patient was brought into the operating room and, after adequate spinal anesthesia was achieved, a tourniquet was  placed on the patient's upper thigh. The patient's knee and leg were cleaned and prepped with alcohol and DuraPrep and draped in the usual sterile fashion. A "timeout" was performed as per usual protocol. The lower extremity was exsanguinated using an Esmarch, and the tourniquet was inflated to 300 mmHg. An anterior longitudinal incision was made followed by a standard mid vastus approach. The deep fibers of the medial collateral ligament were elevated in a subperiosteal fashion off of the medial flare of the tibia so as to maintain a continuous soft tissue sleeve. The patella was subluxed laterally and the patellofemoral ligament was incised. Inspection of the knee demonstrated severe degenerative changes with full-thickness loss of articular cartilage. Osteophytes were debrided using a rongeur. Anterior and posterior cruciate ligaments were excised. Two 4.0 mm Schanz pins were inserted in the femur and into the tibia for attachment of the array of trackers used for computer-assisted navigation. Hip center was identified using a circumduction technique. Distal landmarks were mapped using the computer. The distal femur and proximal tibia were mapped using the computer. The distal femoral cutting guide was positioned using computer-assisted navigation so as to achieve a 5 distal valgus cut. The femur was sized and it was felt that a size 4N femoral component was appropriate. A size 4 femoral cutting guide was positioned and the anterior cut was performed and verified using the computer. This was followed by completion of the posterior and chamfer cuts. Femoral cutting guide for the central box was then positioned in the center box cut was performed.  Attention was then directed to the proximal tibia. Medial and lateral menisci were excised. The extramedullary tibial cutting guide was positioned using computer-assisted navigation so as to achieve a 0 varus-valgus alignment and 3 posterior slope. The cut was  performed and verified using the computer. The proximal tibia was  sized and it was felt that a size 3 tibial tray was appropriate. Tibial and femoral trials were inserted followed by insertion of a 5 mm polyethylene insert. This allowed for excellent mediolateral soft tissue balancing both in flexion and in full extension. Finally, the patella was cut and prepared so as to accommodate a 32 mm medialized dome patella. A patella trial was placed and the knee was placed through a range of motion with excellent patellar tracking appreciated. The femoral trial was removed after debridement of posterior osteophytes. The central post-hole for the tibial component was reamed followed by insertion of a keel punch. Tibial trials were then removed. Cut surfaces of bone were irrigated with copious amounts of normal saline using pulsatile lavage and then suctioned dry. Polymethylmethacrylate cement with gentamicin was prepared in the usual fashion using a vacuum mixer. Cement was applied to the cut surface of the proximal tibia as well as along the undersurface of a size 3 rotating platform tibial component. Tibial component was positioned and impacted into place. Excess cement was removed using [employer] fast streamer. Cement was then applied to the cut surfaces of the femur as well as along the posterior flanges of the size 4N femoral component. The femoral component was positioned and impacted into place. Excess cement was removed using [employer] fast streamer. A 5 mm polyethylene trial was inserted and the knee was brought into full extension with steady axial compression applied. Finally, cement was applied to the backside of a 32 mm medialized dome patella and the patellar component was positioned and patellar clamp applied. Excess cement was removed using [employer] fast streamer. After adequate curing of the cement, the tourniquet was deflated after a total tourniquet time of 105 minutes. Hemostasis was achieved using electrocautery. The knee  was irrigated with copious amounts of normal saline using pulsatile lavage followed by 450 ml of Surgiphor and then suctioned dry. 20 mL of 1.3% Exparel and 60 mL of 0.25% Marcaine in 40 mL of normal saline was injected along the posterior capsule, medial and lateral gutters, and along the arthrotomy site. A 5 mm stabilized rotating platform polyethylene insert was inserted and the knee was placed through a range of motion with excellent mediolateral soft tissue balancing appreciated and excellent patellar tracking noted. 2 medium drains were placed in the wound bed and brought out through separate stab incisions. The medial parapatellar portion of the incision was reapproximated using interrupted sutures of #1 Vicryl. Subcutaneous tissue was approximated in layers using first #0 Vicryl followed #2-0 Vicryl. The skin was approximated with skin staples. A sterile dressing was applied.  The patient tolerated the procedure well and was transported to the recovery room in stable condition.    Carlita Whitcomb P. Holley Bouche., M.D.

## 2021-11-25 ENCOUNTER — Encounter: Payer: Self-pay | Admitting: Orthopedic Surgery

## 2021-11-25 DIAGNOSIS — Z79899 Other long term (current) drug therapy: Secondary | ICD-10-CM | POA: Diagnosis not present

## 2021-11-25 DIAGNOSIS — Z87891 Personal history of nicotine dependence: Secondary | ICD-10-CM | POA: Diagnosis not present

## 2021-11-25 DIAGNOSIS — Z96651 Presence of right artificial knee joint: Secondary | ICD-10-CM | POA: Diagnosis not present

## 2021-11-25 DIAGNOSIS — M1712 Unilateral primary osteoarthritis, left knee: Secondary | ICD-10-CM | POA: Diagnosis not present

## 2021-11-25 MED ORDER — CELECOXIB 200 MG PO CAPS: 200.0000 mg | ORAL_CAPSULE | Freq: Two times a day (BID) | ORAL | 0 refills | Status: DC

## 2021-11-25 MED ORDER — OXYCODONE HCL 5 MG PO TABS: 5.0000 mg | ORAL_TABLET | ORAL | 0 refills | Status: DC | PRN

## 2021-11-25 MED ORDER — TRAMADOL HCL 50 MG PO TABS: 50.0000 mg | ORAL_TABLET | ORAL | 0 refills | Status: DC | PRN

## 2021-11-25 MED ORDER — ENOXAPARIN SODIUM 40 MG/0.4ML IJ SOSY: 40.0000 mg | PREFILLED_SYRINGE | INTRAMUSCULAR | 0 refills | Status: DC

## 2021-11-25 NOTE — Progress Notes (Signed)
Post-op dressing removed. , Hemovac removed., and Mini compression dressing applied.    

## 2021-11-25 NOTE — Evaluation (Addendum)
Physical Therapy Evaluation Patient Details Name: Sierra Moore MRN: [id number] DOB: 1971/03/21 Today's Date: 11/25/2021  History of Present Illness  Patient is a 51 year old female with degenerative arthrosis of the left knee s/p left TKA on 11/24/21. History of right TKA 7/22.  Clinical Impression  Patient is agreeable to PT evaluation and had been pre-medicated prior to mobilizing. She reports she has DME in place from knee replacement surgery last year. 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  Today, gait training was initiated. Patient ambulated around the nursing station with occasional cues for rolling walker and LE sequencing. She does complain of increased left knee pain with mobility. Supervision provided with gait training and transfers. Left knee ROM 10-77 degrees. Therapetuic exercises also initiated and written home exercise program was provided. PT will return in the afternoon to progress mobility and for stair training as appropriate. Recommend to continue PT to maximize independence and facilitate return to prior level of function.      Recommendations for follow up therapy are one component of a multi-disciplinary discharge planning process, led by the attending physician.  Recommendations may be updated based on patient status, additional functional criteria and insurance authorization.  Follow Up Recommendations Home health PT    Assistance Recommended at Discharge PRN  Patient can return home with the following  Help with stairs or ramp for entrance;Assist for transportation    Equipment Recommendations None recommended by PT  Recommendations for Other Services       Functional Status Assessment Patient has had a recent decline in their functional status and demonstrates the ability to make significant improvements in function in a reasonable and predictable amount of time.     Precautions / Restrictions  Precautions Precautions: Fall;Knee Precaution Booklet Issued: Yes (comment) Required Braces or Orthoses:  (KI not required as patient can perform SLR) Restrictions Weight Bearing Restrictions: Yes LLE Weight Bearing: Weight bearing as tolerated      Mobility  Bed Mobility Overal bed mobility: Needs Assistance Bed Mobility: Supine to Sit     Supine to sit: Min guard     General bed mobility comments: increased time required. occasional cues for safety    Transfers Overall transfer level: Needs assistance Equipment used: Rolling walker (2 wheels) Transfers: Sit to/from Stand Sit to Stand: Supervision           General transfer comment: supervision for safety. patient demonstrated correct technique with initial verbal cues for hand placement    Ambulation/Gait Ambulation/Gait assistance: Supervision Gait Distance (Feet): 175 Feet Assistive device: Rolling walker (2 wheels) Gait Pattern/deviations: Step-to pattern, Step-through pattern, Decreased step length - left, Antalgic, Decreased stance time - left Gait velocity: decreased     General Gait Details: occasional cues for appropriate sequencing of rolling walker and BLE. patient started with step to pattern initially progressing to step through pattern. she ambulated around the nursing station in hallway and in the room. she does complain of increased left knee pain with mobility and weight bearing  Stairs            Wheelchair Mobility    Modified Rankin (Stroke Patients Only)       Balance                                             Pertinent Vitals/Pain  Pain Assessment Pain Assessment: Faces Faces Pain Scale: Hurts even more Pain Location: anterior left knee Pain Descriptors / Indicators: Sore Pain Intervention(s): Limited activity within patient's tolerance, Monitored during session, Repositioned, Premedicated before session    [place] expects to be  discharged to:: Private residence Living Arrangements: Spouse/significant other Available Help at Discharge: Family Type of Home: House Home Access: Stairs to enter Entrance Stairs-Rails: Right;Left;Can reach both Technical brewer of Steps: 3   Home Layout: One level Home Equipment: Conservation officer, nature (2 wheels);BSC/3in1;Shower seat      Prior Function Prior Level of Function : Independent/Modified Independent;Working/employed (works as a Gaffer)             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 she can activate hip, ankle, knee movement in supine and standing. no knee buckling with weight bearing LLE Sensation: WNL       Communication   Communication: No difficulties  Cognition Arousal/Alertness: Awake/alert Behavior During Therapy: WFL for tasks assessed/performed Overall Cognitive Status: Within Functional Limits for tasks assessed                                 General Comments: patient is following all commands without difficulty        General Comments      Exercises Total Joint Exercises Ankle Circles/Pumps: AROM, Strengthening, Left, 10 reps, Supine Quad Sets: AROM, Strengthening, Left, 10 reps, Supine Straight Leg Raises: AROM, Strengthening, Left, 5 reps, Supine Goniometric ROM: left knee 10-77 degrees   Assessment/Plan    PT Assessment Patient needs continued PT services  PT Problem List Decreased strength;Decreased range of motion;Decreased activity  tolerance;Decreased balance;Decreased mobility;Pain       PT Treatment Interventions Gait training;DME instruction;Stair training;Functional mobility training;Therapeutic activities;Therapeutic exercise;Balance training;Neuromuscular re-education    PT Goals (Current goals can be found in the Care Plan section)  Acute Rehab PT Goals Patient Stated Goal: to return home and back to work eventually PT Goal Formulation: With patient Time For Goal Achievement: 12/09/21 Potential to Achieve Goals: Good    Frequency BID     Co-evaluation               AM-PAC PT "6 Clicks" Mobility  Outcome Measure Help needed turning from your back to your side while in a flat bed without using bedrails?: None Help needed moving from lying on your back to sitting on the side of a flat bed without using bedrails?: A Little Help needed moving to and from a bed to a chair (including a wheelchair)?: A Little Help needed standing up from a chair using your arms (e.g., wheelchair or bedside chair)?: A Little Help needed to walk in hospital room?: A Little Help needed climbing 3-5 steps with a railing? : A Little 6 Click Score: 19    End of Session Equipment Utilized During Treatment: Gait belt Activity Tolerance: Patient tolerated treatment well Patient left: Other (comment) (in bathroom with OT present in room) Nurse Communication: Mobility status  PT Visit Diagnosis: Pain;Other abnormalities of gait and mobility (R26.89) Pain - Right/Left: Left Pain - part of body: Knee    Time: 8875-7972 PT Time Calculation (min) (ACUTE ONLY): 23 min   Charges:   PT Evaluation $PT Eval Low Complexity: 1 Low PT Treatments $Gait Training: 8-22 mins        Minna Merritts, PT, MPT   Percell Locus 11/25/2021, 10:12 AM

## 2021-11-25 NOTE — Progress Notes (Signed)
Physical Therapy Treatment Patient Details Name: Sierra Moore MRN: [id number] DOB: [birth year] Today's Date: 11/25/2021   History of Present Illness Patient is a 51 year old female with degenerative arthrosis of the left knee s/p left TKA on 11/24/21. History of right TKA 7/22.    PT Comments    Patient seen for afternoon PT session. No physical assistance required for standing or ambulating. Stair training completed this session. Progressed with gait training in hallway using rolling walker with occasional cues for safety and decreased step length on LLE. Patient does continue to have left knee pain with mobility. She is hopeful to discharge home later today. Recommend HHPT at discharge.    Recommendations for follow up therapy are one component of a multi-disciplinary discharge planning process, led by the attending physician.  Recommendations may be updated based on patient status, additional functional criteria and insurance authorization.  Follow Up Recommendations  Home health PT     Assistance Recommended at Discharge PRN  Patient can return home with the following Help with stairs or ramp for entrance;Assist for transportation   Equipment Recommendations  None recommended by PT    Recommendations for Other Services       Precautions / Restrictions Precautions Precautions: Fall;Knee Restrictions Weight Bearing Restrictions: Yes LLE Weight Bearing: Weight bearing as tolerated     Mobility  Bed Mobility Overal bed mobility: Needs Assistance Bed Mobility: Supine to Sit     Supine to sit: Min guard     General bed mobility comments: not addressed as patient sitting up on arrival and post session    Transfers Overall transfer level: Needs assistance Equipment used: Rolling walker (2 wheels) Transfers: Sit to/from Stand Sit to Stand: Supervision           General transfer comment: verbal cues for hand placement and LLE positioning with sitting. patient  educated on safe car transfer technique and she verbalized understanding    Ambulation/Gait Ambulation/Gait assistance: Supervision Gait Distance (Feet): 120 Feet Assistive device: Rolling walker (2 wheels) Gait Pattern/deviations: Step-through pattern Gait velocity: decreased     General Gait Details: intermittent cues for step length of LLE and to stay within rolling walker for support. she reports left knee pain with walking. patient ambulated from her room to the gym for stair training and requested to ride in chair back to room due to knee discomfort with activity.   Stairs Stairs: Yes Stairs assistance: Min guard Stair Management: Two rails, Step to pattern, Forwards Number of Stairs: 4 General stair comments: patient went up and down steps with correct technique after inital verbal instructions for sequencing. discussed how to perform safely also using one railing if needed.   Wheelchair Mobility    Modified Rankin (Stroke Patients Only)       Balance                                            Cognition Arousal/Alertness: Awake/alert Behavior During Therapy: WFL for tasks assessed/performed Overall Cognitive Status: Within Functional Limits for tasks assessed                                 General Comments: patient is following all commands without difficulty        Exercises Total Joint Exercises Ankle Circles/Pumps: AROM, Strengthening, Left, 10 reps,  Supine Quad Sets: AROM, Strengthening, Left, 10 reps, Supine Straight Leg Raises: AROM, Strengthening, Left, 5 reps, Supine Goniometric ROM: left knee 10-77 degrees    General Comments        Pertinent Vitals/Pain Pain Assessment Pain Assessment: 0-10 Pain Score: 7  Faces Pain Scale: Hurts even more Pain Location: anterior left knee Pain Descriptors / Indicators: Sore Pain Intervention(s): Limited activity within patient's tolerance    Home Living                           Prior Function            PT Goals (current goals can now be found in the care plan section) Acute Rehab PT Goals Patient Stated Goal: to return home and back to work eventually PT Goal Formulation: With patient Time For Goal Achievement: 12/09/21 Potential to Achieve Goals: Good Progress towards PT goals: Progressing toward goals    Frequency           PT Plan Current plan remains appropriate    Co-evaluation              AM-PAC PT "6 Clicks" Mobility   Outcome Measure  Help needed turning from your back to your side while in a flat bed without using bedrails?: None Help needed moving from lying on your back to sitting on the side of a flat bed without using bedrails?: A Little Help needed moving to and from a bed to a chair (including a wheelchair)?: A Little Help needed standing up from a chair using your arms (e.g., wheelchair or bedside chair)?: A Little Help needed to walk in hospital room?: A Little Help needed climbing 3-5 steps with a railing? : A Little 6 Click Score: 19    End of Session Equipment Utilized During Treatment: Gait belt Activity Tolerance: Patient tolerated treatment well Patient left: in chair;with call bell/phone within reach;with family/visitor present   PT Visit Diagnosis: Pain;Other abnormalities of gait and mobility (R26.89)     Time: 1416-1450 PT Time Calculation (min) (ACUTE ONLY): 34 min  Charges:  $Gait Training: 23-37 mins                     Minna Merritts, PT, MPT   Percell Locus 11/25/2021, 4:10 PM

## 2021-11-25 NOTE — Evaluation (Signed)
Occupational Therapy Evaluation Patient Details Name: Sierra Moore MRN: [id number] DOB: 07-02-1971 Today's Date: 11/25/2021   History of Present Illness Patient is a 51 year old female with degenerative arthrosis of the left knee s/p left TKA on 11/24/21. History of right TKA 7/22.   Clinical Impression   Ms Fluegge was seen for OT evaluation this date. 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 Upon arrival pt seated on toilet with PT. Pt currently requires SUPERVISION + RW for toilet t/f, pericare, and hand washing standing sink side. MIN A for LB access in long sitting. Pt reports 7/10 pain, educated on pain mgmt strategies, polar care, and compression stocking mgmt. All education complete, no skilled acute OT needs, will sign off. Upon hospital discharge, recommend no OT follow up.   Recommendations for follow up therapy are one component of a multi-disciplinary discharge planning process, led by the attending physician.  Recommendations may be updated based on patient status, additional functional criteria and insurance authorization.   Follow Up Recommendations  No OT follow up    Assistance Recommended at Discharge Intermittent Supervision/Assistance  Patient can return home with the following A little help with bathing/dressing/bathroom;Help with stairs or ramp for entrance    Functional Status Assessment  Patient has had a recent decline in their functional status and demonstrates the ability to make significant improvements in function in a reasonable and predictable amount of time.  Equipment Recommendations  None recommended by OT    Recommendations for Other Services       Precautions / Restrictions Precautions Precautions: Fall;Knee Precaution Booklet Issued: Yes (comment) Required Braces or Orthoses:  (KI not required as patient can perform SLR) Restrictions Weight Bearing  Restrictions: Yes LLE Weight Bearing: Weight bearing as tolerated      Mobility Bed Mobility               General bed mobility comments: receieved and left sitting    Transfers Overall transfer level: Needs assistance Equipment used: Rolling walker (2 wheels) Transfers: Sit to/from Stand Sit to Stand: Supervision                  Balance Overall balance assessment: Mild deficits observed, not formally tested                                         ADL either performed or assessed with clinical judgement   ADL Overall ADL's : Needs assistance/impaired                                       General ADL Comments: MIN A for LB access in long sitting. SUPERVISION + RW for toilet t/f, pericare, and hand washing standing sink side.      Pertinent Vitals/Pain Pain Assessment Pain Assessment: 0-10 Pain Score: 7  Pain Location: anterior left knee Pain Descriptors / Indicators: Sore Pain Intervention(s): Limited activity within patient's tolerance, Repositioned, Premedicated before session     Hand Dominance     Extremity/Trunk Assessment Upper Extremity Assessment Upper Extremity Assessment: Overall WFL for tasks assessed   Lower Extremity Assessment Lower Extremity Assessment: Defer to PT evaluation       Communication Communication Communication: No difficulties   Cognition Arousal/Alertness: Awake/alert  Behavior During Therapy: WFL for tasks assessed/performed Overall Cognitive Status: Within Functional Limits for tasks assessed                                 General Comments: patient is following all commands without difficulty                Home Living Family/patient expects to be discharged to:: Private residence Living Arrangements: Spouse/significant other Available Help at Discharge: Family Type of Home: House Home Access: Stairs to enter Technical brewer of Steps: 3 Entrance  Stairs-Rails: Right;Left;Can reach both [place]: One level     Bathroom Shower/Tub: Tub/shower unit;Curtain   Bathroom Toilet: Handicapped height     [place]: Conservation officer, nature (2 wheels);BSC/3in1;Shower seat          Prior Functioning/Environment Prior Level of Function : Independent/Modified Independent;Working/employed (works as a Gaffer)             Mobility Comments: independent with no assistive device          OT Problem List: Decreased range of motion;Decreased activity tolerance         OT Goals(Current goals can be found in the care plan section) Acute Rehab OT Goals Patient Stated Goal: to go home OT Goal Formulation: With patient Time For Goal Achievement: 12/09/21 Potential to Achieve Goals: Good   AM-PAC OT "6 Clicks" Daily Activity     Outcome Measure Help from another person eating meals?: None Help from another person taking care of personal grooming?: A Little Help from another person toileting, which includes using toliet, bedpan, or urinal?: A Little Help from another person bathing (including washing, rinsing, drying)?: A Little Help from another person to put on and taking off regular upper body clothing?: None Help from another person to put on and taking off regular lower body clothing?: A Little 6 Click Score: 20   End of Session Equipment Utilized During Treatment: Rolling walker (2 wheels)  Activity Tolerance: Patient tolerated treatment well Patient left: in chair;with call bell/phone within reach  OT Visit Diagnosis: Other abnormalities of gait and mobility (R26.89)                Time: 8616-8372 OT Time Calculation (min): 17 min Charges:  OT General Charges $OT Visit: 1 Visit OT Evaluation $OT Eval Low Complexity: 1 Low  Dessie Coma, M.S. OTR/L  11/25/21, 10:23 AM  ascom [phone]

## 2021-11-25 NOTE — Anesthesia Postprocedure Evaluation (Signed)
Anesthesia Post Note  Patient: Sierra Moore  Procedure(s) Performed: COMPUTER ASSISTED TOTAL KNEE ARTHROPLASTY (Left: Knee)  Patient location during evaluation: Nursing Unit Anesthesia Type: Spinal Level of consciousness: oriented and awake and alert Pain management: pain level controlled Vital Signs Assessment: post-procedure vital signs reviewed and stable Respiratory status: spontaneous breathing and respiratory function stable Cardiovascular status: blood pressure returned to baseline and stable Postop Assessment: no headache, no backache, no apparent nausea or vomiting, patient able to bend at knees and adequate PO intake Anesthetic complications: no   No notable events documented.   Last Vitals:  Vitals:   11/24/21 1944 11/25/21 0336  BP: 137/86 119/78  Pulse: 92 92  Resp: 17 17  Temp: 36.7 C 36.4 C  SpO2: 97% 98%    Last Pain:  Vitals:   11/25/21 0630  TempSrc:   PainSc: 5                  Brantley Fling

## 2021-11-25 NOTE — Plan of Care (Signed)

## 2021-11-25 NOTE — Progress Notes (Signed)
Met with the patient in the room at the bedside The patient lives at Home with her spouse The patient  currently has RW, BSC, shower seat The patient will not need additional DME They have transportation with spouse They can afford their medication  They are set up with [facility] for Home health services

## 2021-11-25 NOTE — Discharge Summary (Signed)
Physician Discharge Summary  Patient ID: Sierra Moore MRN: [id number] DOB/AGE: [birth year] 51 y.o.  Admit date: 11/24/2021 Discharge date: 11/25/2021  Admission Diagnoses:  Total knee replacement status [Z96.659]  Surgeries:Procedure(s):   Left total knee arthroplasty using computer-assisted navigation   SURGEON:  Marciano Sequin. M.D.   ASSISTANT: Cassell Smiles, PA-C (present and scrubbed throughout the case, critical for assistance with exposure, retraction, instrumentation, and closure)   ANESTHESIA: spinal   ESTIMATED BLOOD LOSS: 50 mL   FLUIDS REPLACED: 900 mL of crystalloid   TOURNIQUET TIME: 105 minutes   DRAINS: 2 medium Hemovac drains   SOFT TISSUE RELEASES: Anterior cruciate ligament, posterior cruciate ligament, deep medial collateral ligament, patellofemoral ligament   IMPLANTS UTILIZED: DePuy Attune size 4N posterior stabilized femoral component (cemented), size 3 rotating platform tibial component (cemented), 32 mm medialized dome patella (cemented), and a 5 mm stabilized rotating platform polyethylene insert.  Discharge Diagnoses: Patient Active Problem List   Diagnosis Date Noted   Total knee replacement status 11/24/2021   Left lower quadrant abdominal pain 04/21/2021   S/P total knee arthroplasty, right 01/03/2021   Marital problem 12/30/2020   Primary osteoarthritis of both knees 05/09/2019   Primary osteoarthritis of left knee 05/09/2019   Special screening for malignant neoplasms, colon    Polyp of descending colon    Other chest pain    Onychomycosis of toenail 12/27/2018   Polyarthralgia 12/27/2018   HLD (hyperlipidemia) 12/21/2018   Right foot pain 12/07/2018   Eczema 03/04/2017   Raynaud's phenomenon without gangrene 03/04/2017   Dysuria 09/25/2016   Mixed stress and urge urinary incontinence 09/25/2016   Mild obesity 03/04/2016   Seasonal allergies 02/10/2016   Encounter for routine adult physical exam with abnormal findings 02/10/2016    Heat intolerance 02/10/2016   Hematuria 02/10/2016   Vitamin D deficiency 10/25/2015   History of prediabetes 10/25/2015   IBS (irritable bowel syndrome) 10/24/2015    Past Medical History:  Diagnosis Date   Allergy    Arthritis    POLYARTHRALGIA   GERD (gastroesophageal reflux disease)    occ   History of kidney stones    currently as of 12-25-20   Leg swelling    Obesity    PMDD (premenstrual dysphoric disorder)    PMDD (premenstrual dysphoric disorder)    Raynaud disease    Vitamin D deficiency      Transfusion:    Consultants (if any):   Discharged Condition: Improved  Hospital Course: Sierra Moore is an 51 y.o. female who was admitted 11/24/2021 with a diagnosis of left knee osteoarthritis and went to the operating room on 11/24/2021 and underwent left total knee arthoplasty. The patient received perioperative antibiotics for prophylaxis (see below). The patient tolerated the procedure well and was transported to PACU in stable condition. After meeting PACU criteria, the patient was subsequently transferred to the Orthopaedics/Rehabilitation unit.   The patient received DVT prophylaxis in the form of early mobilization, Lovenox, Foot Pumps, and SCDs . A sacral pad had been placed and heels were elevated off of the bed with rolled towels in order to protect skin integrity. Foley catheter was discontinued on postoperative day #0. Wound drains were discontinued on postoperative day #1. The surgical incision was healing well without signs of infection.  Physical therapy was initiated postoperatively for transfers, gait training, and strengthening. Occupational therapy was initiated for activities of daily living and evaluation for assisted devices. Rehabilitation goals were reviewed in detail with the patient. The patient  made steady progress with physical therapy and physical therapy recommended discharge to Home.   The patient achieved the preliminary goals of this  hospitalization and was felt to be medically and orthopaedically appropriate for discharge.  She was given perioperative antibiotics:  Anti-infectives (From admission, onward)    Start     Dose/Rate Route Frequency Ordered Stop   11/24/21 1800  ceFAZolin (ANCEF) IVPB 2g/100 mL premix        2 g 200 mL/hr over 30 Minutes Intravenous Every 6 hours 11/24/21 1712 11/25/21 0115   11/24/21 0957  ceFAZolin (ANCEF) 2-4 GM/100ML-% IVPB       Note to Pharmacy: Rivka Spring L: cabinet override      11/24/21 0957 11/24/21 2214   11/24/21 0600  ceFAZolin (ANCEF) IVPB 2g/100 mL premix  Status:  Discontinued        2 g 200 mL/hr over 30 Minutes Intravenous On call to O.R. 11/24/21 9211 11/24/21 0849     .  Recent vital signs:  Vitals:   11/25/21 0336 11/25/21 0838  BP: 119/78 (!) 158/76  Pulse: 92 72  Resp: 17 18  Temp: 97.6 F (36.4 C) 97.6 F (36.4 C)  SpO2: 98% 98%    Recent laboratory studies:  No results for input(s): WBC, HGB, HCT, PLT, K, CL, CO2, BUN, CREATININE, GLUCOSE, CALCIUM, LABPT, INR in the last 72 hours.  Diagnostic Studies: DG Knee Left Port  Result Date: 11/24/2021 CLINICAL DATA:  LEFT total knee arthroplasty. EXAM: PORTABLE LEFT KNEE - 1-2 VIEW COMPARISON:  None Available. FINDINGS: LEFT total knee arthroplasty changes identified. No acute fracture or dislocation. No complicating features are noted. IMPRESSION: LEFT total knee arthroplasty without complicating features. Electronically Signed   By: Margarette Canada M.D.   On: 11/24/2021 15:56   MM 3D SCREEN BREAST BILATERAL  Result Date: 11/17/2021 CLINICAL DATA:  Screening. EXAM: DIGITAL SCREENING BILATERAL MAMMOGRAM WITH TOMOSYNTHESIS AND CAD TECHNIQUE: Bilateral screening digital craniocaudal and mediolateral oblique mammograms were obtained. Bilateral screening digital breast tomosynthesis was performed. The images were evaluated with computer-aided detection. COMPARISON:  Previous exam(s). ACR Breast Density Category  b: There are scattered areas of fibroglandular density. FINDINGS: There are no findings suspicious for malignancy. IMPRESSION: No mammographic evidence of malignancy. A result letter of this screening mammogram will be mailed directly to the patient. RECOMMENDATION: Screening mammogram in one year. (Code:SM-B-01Y) BI-RADS CATEGORY  1: Negative. Electronically Signed   By: Franki Cabot M.D.   On: 11/17/2021 14:27    Discharge Medications:   Allergies as of 11/25/2021       Reactions   Penicillins Hives   IgE = 16 (WNL) on 12/25/2020 Did it involve swelling of the face/tongue/throat, SOB, or low BP? No Did it involve sudden or severe rash/hives, skin peeling, or any reaction on the inside of your mouth or nose? No Did you need to seek medical attention at a hospital or doctor's office? No When did it last happen?     within the last 5 years  If all above answers are "NO", may proceed with cephalosporin use.        Medication List     TAKE these medications    acetaminophen 500 MG tablet Commonly known as: TYLENOL Take 500-1,000 mg by mouth every 6 (six) hours as needed for moderate pain.   celecoxib 200 MG capsule Commonly known as: CELEBREX Take 1 capsule (200 mg total) by mouth 2 (two) times daily.   cholecalciferol 25 MCG (1000 UNIT) tablet Commonly  known as: VITAMIN D3 Take 1,000 Units by mouth daily.   diclofenac Sodium 1 % Gel Commonly known as: VOLTAREN Apply 2 g topically 4 (four) times daily.   enoxaparin 40 MG/0.4ML injection Commonly known as: LOVENOX Inject 0.4 mLs (40 mg total) into the skin daily for 14 days.   LESSINA-28 0.1-20 MG-MCG tablet Generic drug: levonorgestrel-ethinyl estradiol TAKE 1 TABLET BY MOUTH EVERY DAY CONTINUOUSLY   omeprazole 20 MG tablet Commonly known as: PRILOSEC OTC Take 20 mg by mouth daily as needed (heartburn).   oxyCODONE 5 MG immediate release tablet Commonly known as: Oxy IR/ROXICODONE Take 1 tablet (5 mg total) by mouth  every 4 (four) hours as needed for severe pain.   traMADol 50 MG tablet Commonly known as: ULTRAM Take 1 tablet (50 mg total) by mouth every 4 (four) hours as needed for moderate pain.               Durable Medical Equipment  (From admission, onward)           Start     Ordered   11/24/21 1713  DME Walker rolling  Once       Question:  Patient needs a walker to treat with the following condition  Answer:  Total knee replacement status   11/24/21 1712   11/24/21 1713  DME Bedside commode  Once       Question:  Patient needs a bedside commode to treat with the following condition  Answer:  Total knee replacement status   11/24/21 1712            Disposition: Home with home health PT     Follow-up Information     Fausto Skillern, PA-C Follow up on 12/09/2021.   Specialty: Orthopedic Surgery Why: at 9:45am Contact information: [place] [address] [phone]         Dereck Leep, MD Follow up on 01/13/2022.   Specialty: Orthopedic Surgery Why: at 3:15pm Contact information: [place]  [id number] [place], PA-C 11/25/2021, 2:57 PM

## 2021-11-25 NOTE — TOC Initial Note (Signed)
Transition of Care [facility]) - CM/SW Discharge Note   Patient Details  Name: Sierra Moore MRN: [id number] Date of Birth: August 26, 1970  [facility] CM/SW Contact:  Conception Oms, RN Phone Number: 11/25/2021, 11:24 AM   Clinical Narrative:     The patient is set up with [facility] for [facility] services prior to surgery by Surgeons office        Patient Goals and CMS Choice        Discharge Placement                       Discharge Plan and Services                                     Social Determinants of Health (SDOH) Interventions     Readmission Risk Interventions     View : No data to display.

## 2021-11-25 NOTE — Progress Notes (Addendum)
  Subjective: 1 Day Post-Op Procedure(s) (LRB): COMPUTER ASSISTED TOTAL KNEE ARTHROPLASTY (Left) Patient reports pain as moderate.   Patient is well, and has had no acute complaints or problems Plan is to go Home after hospital stay. Negative for chest pain and shortness of breath Fever: no Gastrointestinal: negative for nausea and vomiting.  Patient has not had a bowel movement.  Objective: Vital signs in last 24 hours: Temp:  [97.6 F (36.4 C)-99.4 F (37.4 C)] 97.6 F (36.4 C) (05/23 0838) Pulse Rate:  [72-92] 72 (05/23 0838) Resp:  [12-21] 18 (05/23 0838) BP: (100-158)/(35-86) 158/76 (05/23 0838) SpO2:  [93 %-98 %] 98 % (05/23 0838) Weight:  [97.1 kg] 97.1 kg (05/22 1754)  Intake/Output from previous day:  Intake/Output Summary (Last 24 hours) at 11/25/2021 1017 Last data filed at 11/25/2021 0200 Gross per 24 hour  Intake 2105.28 ml  Output 1700 ml  Net 405.28 ml    Intake/Output this shift: No intake/output data recorded.  Labs: No results for input(s): HGB in the last 72 hours. No results for input(s): WBC, RBC, HCT, PLT in the last 72 hours. No results for input(s): NA, K, CL, CO2, BUN, CREATININE, GLUCOSE, CALCIUM in the last 72 hours. No results for input(s): LABPT, INR in the last 72 hours.   EXAM General - Patient is Alert, Appropriate, and Oriented Extremity - Neurovascular intact Dorsiflexion/Plantar flexion intact Compartment soft Dressing/Incision -Postoperative dressing remains in place., Polar Care in place and working. , Hemovac in place.  Motor Function - intact, moving foot and toes well on exam. Able to perform independent SLR.  Cardiovascular- Regular rate and rhythm, no murmurs/rubs/gallops Respiratory- Lungs clear to auscultation bilaterally Gastrointestinal- soft, nontender, and active bowel sounds   Assessment/Plan: 1 Day Post-Op Procedure(s) (LRB): COMPUTER ASSISTED TOTAL KNEE ARTHROPLASTY (Left) Principal Problem:   Total knee  replacement status  Estimated body mass index is 40.45 kg/m as calculated from the following:   Height as of this encounter: '5\' 1"'$  (1.549 m).   Weight as of this encounter: 97.1 kg. Advance diet Up with therapy  Likely d/c today pending completion of therapy goals.   DVT Prophylaxis - Lovenox, Ted hose, and SCDs Weight-Bearing as tolerated to left leg  Cassell Smiles, PA-C [facility] Orthopaedic Surgery 11/25/2021, 10:17 AM

## 2021-11-26 DIAGNOSIS — E785 Hyperlipidemia, unspecified: Secondary | ICD-10-CM | POA: Diagnosis not present

## 2021-11-26 DIAGNOSIS — Z9049 Acquired absence of other specified parts of digestive tract: Secondary | ICD-10-CM | POA: Diagnosis not present

## 2021-11-26 DIAGNOSIS — Z872 Personal history of diseases of the skin and subcutaneous tissue: Secondary | ICD-10-CM | POA: Diagnosis not present

## 2021-11-26 DIAGNOSIS — Z87891 Personal history of nicotine dependence: Secondary | ICD-10-CM | POA: Diagnosis not present

## 2021-11-26 DIAGNOSIS — Z471 Aftercare following joint replacement surgery: Secondary | ICD-10-CM | POA: Diagnosis not present

## 2021-11-26 DIAGNOSIS — F3281 Premenstrual dysphoric disorder: Secondary | ICD-10-CM | POA: Diagnosis not present

## 2021-11-26 DIAGNOSIS — K219 Gastro-esophageal reflux disease without esophagitis: Secondary | ICD-10-CM | POA: Diagnosis not present

## 2021-11-26 DIAGNOSIS — E559 Vitamin D deficiency, unspecified: Secondary | ICD-10-CM | POA: Diagnosis not present

## 2021-11-26 DIAGNOSIS — J302 Other seasonal allergic rhinitis: Secondary | ICD-10-CM | POA: Diagnosis not present

## 2021-11-26 DIAGNOSIS — E669 Obesity, unspecified: Secondary | ICD-10-CM | POA: Diagnosis not present

## 2021-11-26 DIAGNOSIS — Z7901 Long term (current) use of anticoagulants: Secondary | ICD-10-CM | POA: Diagnosis not present

## 2021-11-26 DIAGNOSIS — I7300 Raynaud's syndrome without gangrene: Secondary | ICD-10-CM | POA: Diagnosis not present

## 2021-11-26 DIAGNOSIS — Z86010 Personal history of colonic polyps: Secondary | ICD-10-CM | POA: Diagnosis not present

## 2021-11-26 DIAGNOSIS — N3946 Mixed incontinence: Secondary | ICD-10-CM | POA: Diagnosis not present

## 2021-11-26 DIAGNOSIS — K589 Irritable bowel syndrome without diarrhea: Secondary | ICD-10-CM | POA: Diagnosis not present

## 2021-11-28 DIAGNOSIS — Z86010 Personal history of colonic polyps: Secondary | ICD-10-CM | POA: Diagnosis not present

## 2021-11-28 DIAGNOSIS — J302 Other seasonal allergic rhinitis: Secondary | ICD-10-CM | POA: Diagnosis not present

## 2021-11-28 DIAGNOSIS — Z872 Personal history of diseases of the skin and subcutaneous tissue: Secondary | ICD-10-CM | POA: Diagnosis not present

## 2021-11-28 DIAGNOSIS — Z471 Aftercare following joint replacement surgery: Secondary | ICD-10-CM | POA: Diagnosis not present

## 2021-11-28 DIAGNOSIS — Z87891 Personal history of nicotine dependence: Secondary | ICD-10-CM | POA: Diagnosis not present

## 2021-11-28 DIAGNOSIS — Z9049 Acquired absence of other specified parts of digestive tract: Secondary | ICD-10-CM | POA: Diagnosis not present

## 2021-11-28 DIAGNOSIS — N3946 Mixed incontinence: Secondary | ICD-10-CM | POA: Diagnosis not present

## 2021-11-28 DIAGNOSIS — E785 Hyperlipidemia, unspecified: Secondary | ICD-10-CM | POA: Diagnosis not present

## 2021-11-28 DIAGNOSIS — F3281 Premenstrual dysphoric disorder: Secondary | ICD-10-CM | POA: Diagnosis not present

## 2021-11-28 DIAGNOSIS — Z7901 Long term (current) use of anticoagulants: Secondary | ICD-10-CM | POA: Diagnosis not present

## 2021-11-28 DIAGNOSIS — K219 Gastro-esophageal reflux disease without esophagitis: Secondary | ICD-10-CM | POA: Diagnosis not present

## 2021-11-28 DIAGNOSIS — K589 Irritable bowel syndrome without diarrhea: Secondary | ICD-10-CM | POA: Diagnosis not present

## 2021-11-28 DIAGNOSIS — E669 Obesity, unspecified: Secondary | ICD-10-CM | POA: Diagnosis not present

## 2021-11-28 DIAGNOSIS — I7300 Raynaud's syndrome without gangrene: Secondary | ICD-10-CM | POA: Diagnosis not present

## 2021-11-28 DIAGNOSIS — E559 Vitamin D deficiency, unspecified: Secondary | ICD-10-CM | POA: Diagnosis not present

## 2021-11-29 DIAGNOSIS — I7300 Raynaud's syndrome without gangrene: Secondary | ICD-10-CM | POA: Diagnosis not present

## 2021-11-29 DIAGNOSIS — Z9049 Acquired absence of other specified parts of digestive tract: Secondary | ICD-10-CM | POA: Diagnosis not present

## 2021-11-29 DIAGNOSIS — N3946 Mixed incontinence: Secondary | ICD-10-CM | POA: Diagnosis not present

## 2021-11-29 DIAGNOSIS — Z471 Aftercare following joint replacement surgery: Secondary | ICD-10-CM | POA: Diagnosis not present

## 2021-11-29 DIAGNOSIS — E559 Vitamin D deficiency, unspecified: Secondary | ICD-10-CM | POA: Diagnosis not present

## 2021-11-29 DIAGNOSIS — E785 Hyperlipidemia, unspecified: Secondary | ICD-10-CM | POA: Diagnosis not present

## 2021-11-29 DIAGNOSIS — F3281 Premenstrual dysphoric disorder: Secondary | ICD-10-CM | POA: Diagnosis not present

## 2021-11-29 DIAGNOSIS — E669 Obesity, unspecified: Secondary | ICD-10-CM | POA: Diagnosis not present

## 2021-11-29 DIAGNOSIS — J302 Other seasonal allergic rhinitis: Secondary | ICD-10-CM | POA: Diagnosis not present

## 2021-11-29 DIAGNOSIS — Z86010 Personal history of colonic polyps: Secondary | ICD-10-CM | POA: Diagnosis not present

## 2021-11-29 DIAGNOSIS — Z87891 Personal history of nicotine dependence: Secondary | ICD-10-CM | POA: Diagnosis not present

## 2021-11-29 DIAGNOSIS — Z872 Personal history of diseases of the skin and subcutaneous tissue: Secondary | ICD-10-CM | POA: Diagnosis not present

## 2021-11-29 DIAGNOSIS — Z7901 Long term (current) use of anticoagulants: Secondary | ICD-10-CM | POA: Diagnosis not present

## 2021-11-29 DIAGNOSIS — K219 Gastro-esophageal reflux disease without esophagitis: Secondary | ICD-10-CM | POA: Diagnosis not present

## 2021-11-29 DIAGNOSIS — K589 Irritable bowel syndrome without diarrhea: Secondary | ICD-10-CM | POA: Diagnosis not present

## 2021-12-01 DIAGNOSIS — Z872 Personal history of diseases of the skin and subcutaneous tissue: Secondary | ICD-10-CM | POA: Diagnosis not present

## 2021-12-01 DIAGNOSIS — Z86010 Personal history of colonic polyps: Secondary | ICD-10-CM | POA: Diagnosis not present

## 2021-12-01 DIAGNOSIS — E669 Obesity, unspecified: Secondary | ICD-10-CM | POA: Diagnosis not present

## 2021-12-01 DIAGNOSIS — N3946 Mixed incontinence: Secondary | ICD-10-CM | POA: Diagnosis not present

## 2021-12-01 DIAGNOSIS — I7300 Raynaud's syndrome without gangrene: Secondary | ICD-10-CM | POA: Diagnosis not present

## 2021-12-01 DIAGNOSIS — K219 Gastro-esophageal reflux disease without esophagitis: Secondary | ICD-10-CM | POA: Diagnosis not present

## 2021-12-01 DIAGNOSIS — Z7901 Long term (current) use of anticoagulants: Secondary | ICD-10-CM | POA: Diagnosis not present

## 2021-12-01 DIAGNOSIS — F3281 Premenstrual dysphoric disorder: Secondary | ICD-10-CM | POA: Diagnosis not present

## 2021-12-01 DIAGNOSIS — Z471 Aftercare following joint replacement surgery: Secondary | ICD-10-CM | POA: Diagnosis not present

## 2021-12-01 DIAGNOSIS — K589 Irritable bowel syndrome without diarrhea: Secondary | ICD-10-CM | POA: Diagnosis not present

## 2021-12-01 DIAGNOSIS — J302 Other seasonal allergic rhinitis: Secondary | ICD-10-CM | POA: Diagnosis not present

## 2021-12-01 DIAGNOSIS — E785 Hyperlipidemia, unspecified: Secondary | ICD-10-CM | POA: Diagnosis not present

## 2021-12-01 DIAGNOSIS — E559 Vitamin D deficiency, unspecified: Secondary | ICD-10-CM | POA: Diagnosis not present

## 2021-12-01 DIAGNOSIS — Z87891 Personal history of nicotine dependence: Secondary | ICD-10-CM | POA: Diagnosis not present

## 2021-12-01 DIAGNOSIS — Z9049 Acquired absence of other specified parts of digestive tract: Secondary | ICD-10-CM | POA: Diagnosis not present

## 2021-12-03 DIAGNOSIS — I7300 Raynaud's syndrome without gangrene: Secondary | ICD-10-CM | POA: Diagnosis not present

## 2021-12-03 DIAGNOSIS — Z9049 Acquired absence of other specified parts of digestive tract: Secondary | ICD-10-CM | POA: Diagnosis not present

## 2021-12-03 DIAGNOSIS — F3281 Premenstrual dysphoric disorder: Secondary | ICD-10-CM | POA: Diagnosis not present

## 2021-12-03 DIAGNOSIS — E669 Obesity, unspecified: Secondary | ICD-10-CM | POA: Diagnosis not present

## 2021-12-03 DIAGNOSIS — K589 Irritable bowel syndrome without diarrhea: Secondary | ICD-10-CM | POA: Diagnosis not present

## 2021-12-03 DIAGNOSIS — Z7901 Long term (current) use of anticoagulants: Secondary | ICD-10-CM | POA: Diagnosis not present

## 2021-12-03 DIAGNOSIS — E559 Vitamin D deficiency, unspecified: Secondary | ICD-10-CM | POA: Diagnosis not present

## 2021-12-03 DIAGNOSIS — J302 Other seasonal allergic rhinitis: Secondary | ICD-10-CM | POA: Diagnosis not present

## 2021-12-03 DIAGNOSIS — Z86010 Personal history of colonic polyps: Secondary | ICD-10-CM | POA: Diagnosis not present

## 2021-12-03 DIAGNOSIS — Z471 Aftercare following joint replacement surgery: Secondary | ICD-10-CM | POA: Diagnosis not present

## 2021-12-03 DIAGNOSIS — E785 Hyperlipidemia, unspecified: Secondary | ICD-10-CM | POA: Diagnosis not present

## 2021-12-03 DIAGNOSIS — N3946 Mixed incontinence: Secondary | ICD-10-CM | POA: Diagnosis not present

## 2021-12-03 DIAGNOSIS — Z87891 Personal history of nicotine dependence: Secondary | ICD-10-CM | POA: Diagnosis not present

## 2021-12-03 DIAGNOSIS — K219 Gastro-esophageal reflux disease without esophagitis: Secondary | ICD-10-CM | POA: Diagnosis not present

## 2021-12-03 DIAGNOSIS — Z872 Personal history of diseases of the skin and subcutaneous tissue: Secondary | ICD-10-CM | POA: Diagnosis not present

## 2021-12-05 DIAGNOSIS — Z872 Personal history of diseases of the skin and subcutaneous tissue: Secondary | ICD-10-CM | POA: Diagnosis not present

## 2021-12-05 DIAGNOSIS — F3281 Premenstrual dysphoric disorder: Secondary | ICD-10-CM | POA: Diagnosis not present

## 2021-12-05 DIAGNOSIS — J302 Other seasonal allergic rhinitis: Secondary | ICD-10-CM | POA: Diagnosis not present

## 2021-12-05 DIAGNOSIS — I7300 Raynaud's syndrome without gangrene: Secondary | ICD-10-CM | POA: Diagnosis not present

## 2021-12-05 DIAGNOSIS — E559 Vitamin D deficiency, unspecified: Secondary | ICD-10-CM | POA: Diagnosis not present

## 2021-12-05 DIAGNOSIS — E669 Obesity, unspecified: Secondary | ICD-10-CM | POA: Diagnosis not present

## 2021-12-05 DIAGNOSIS — Z87891 Personal history of nicotine dependence: Secondary | ICD-10-CM | POA: Diagnosis not present

## 2021-12-05 DIAGNOSIS — Z86010 Personal history of colonic polyps: Secondary | ICD-10-CM | POA: Diagnosis not present

## 2021-12-05 DIAGNOSIS — E785 Hyperlipidemia, unspecified: Secondary | ICD-10-CM | POA: Diagnosis not present

## 2021-12-05 DIAGNOSIS — K589 Irritable bowel syndrome without diarrhea: Secondary | ICD-10-CM | POA: Diagnosis not present

## 2021-12-05 DIAGNOSIS — K219 Gastro-esophageal reflux disease without esophagitis: Secondary | ICD-10-CM | POA: Diagnosis not present

## 2021-12-05 DIAGNOSIS — N3946 Mixed incontinence: Secondary | ICD-10-CM | POA: Diagnosis not present

## 2021-12-05 DIAGNOSIS — Z7901 Long term (current) use of anticoagulants: Secondary | ICD-10-CM | POA: Diagnosis not present

## 2021-12-05 DIAGNOSIS — Z9049 Acquired absence of other specified parts of digestive tract: Secondary | ICD-10-CM | POA: Diagnosis not present

## 2021-12-05 DIAGNOSIS — Z471 Aftercare following joint replacement surgery: Secondary | ICD-10-CM | POA: Diagnosis not present

## 2021-12-08 DIAGNOSIS — Z9049 Acquired absence of other specified parts of digestive tract: Secondary | ICD-10-CM | POA: Diagnosis not present

## 2021-12-08 DIAGNOSIS — E785 Hyperlipidemia, unspecified: Secondary | ICD-10-CM | POA: Diagnosis not present

## 2021-12-08 DIAGNOSIS — E669 Obesity, unspecified: Secondary | ICD-10-CM | POA: Diagnosis not present

## 2021-12-08 DIAGNOSIS — Z87891 Personal history of nicotine dependence: Secondary | ICD-10-CM | POA: Diagnosis not present

## 2021-12-08 DIAGNOSIS — N3946 Mixed incontinence: Secondary | ICD-10-CM | POA: Diagnosis not present

## 2021-12-08 DIAGNOSIS — K219 Gastro-esophageal reflux disease without esophagitis: Secondary | ICD-10-CM | POA: Diagnosis not present

## 2021-12-08 DIAGNOSIS — K589 Irritable bowel syndrome without diarrhea: Secondary | ICD-10-CM | POA: Diagnosis not present

## 2021-12-08 DIAGNOSIS — Z872 Personal history of diseases of the skin and subcutaneous tissue: Secondary | ICD-10-CM | POA: Diagnosis not present

## 2021-12-08 DIAGNOSIS — F3281 Premenstrual dysphoric disorder: Secondary | ICD-10-CM | POA: Diagnosis not present

## 2021-12-08 DIAGNOSIS — I7300 Raynaud's syndrome without gangrene: Secondary | ICD-10-CM | POA: Diagnosis not present

## 2021-12-08 DIAGNOSIS — E559 Vitamin D deficiency, unspecified: Secondary | ICD-10-CM | POA: Diagnosis not present

## 2021-12-08 DIAGNOSIS — Z86010 Personal history of colonic polyps: Secondary | ICD-10-CM | POA: Diagnosis not present

## 2021-12-08 DIAGNOSIS — Z7901 Long term (current) use of anticoagulants: Secondary | ICD-10-CM | POA: Diagnosis not present

## 2021-12-08 DIAGNOSIS — J302 Other seasonal allergic rhinitis: Secondary | ICD-10-CM | POA: Diagnosis not present

## 2021-12-08 DIAGNOSIS — Z471 Aftercare following joint replacement surgery: Secondary | ICD-10-CM | POA: Diagnosis not present

## 2021-12-09 DIAGNOSIS — M25662 Stiffness of left knee, not elsewhere classified: Secondary | ICD-10-CM | POA: Diagnosis not present

## 2021-12-11 DIAGNOSIS — M6281 Muscle weakness (generalized): Secondary | ICD-10-CM | POA: Diagnosis not present

## 2021-12-15 DIAGNOSIS — M6281 Muscle weakness (generalized): Secondary | ICD-10-CM | POA: Diagnosis not present

## 2021-12-18 DIAGNOSIS — M6281 Muscle weakness (generalized): Secondary | ICD-10-CM | POA: Diagnosis not present

## 2021-12-22 DIAGNOSIS — M6281 Muscle weakness (generalized): Secondary | ICD-10-CM | POA: Diagnosis not present

## 2021-12-25 DIAGNOSIS — M6281 Muscle weakness (generalized): Secondary | ICD-10-CM | POA: Diagnosis not present

## 2021-12-31 DIAGNOSIS — M6281 Muscle weakness (generalized): Secondary | ICD-10-CM | POA: Diagnosis not present

## 2022-01-02 DIAGNOSIS — M6281 Muscle weakness (generalized): Secondary | ICD-10-CM | POA: Diagnosis not present

## 2022-01-05 DIAGNOSIS — M6281 Muscle weakness (generalized): Secondary | ICD-10-CM | POA: Diagnosis not present

## 2022-01-07 DIAGNOSIS — Z471 Aftercare following joint replacement surgery: Secondary | ICD-10-CM | POA: Diagnosis not present

## 2022-01-09 DIAGNOSIS — M6281 Muscle weakness (generalized): Secondary | ICD-10-CM | POA: Diagnosis not present

## 2022-01-12 DIAGNOSIS — M6281 Muscle weakness (generalized): Secondary | ICD-10-CM | POA: Diagnosis not present

## 2022-01-13 DIAGNOSIS — M7052 Other bursitis of knee, left knee: Secondary | ICD-10-CM | POA: Diagnosis not present

## 2022-01-13 DIAGNOSIS — M7652 Patellar tendinitis, left knee: Secondary | ICD-10-CM | POA: Diagnosis not present

## 2022-01-13 DIAGNOSIS — Z96652 Presence of left artificial knee joint: Secondary | ICD-10-CM | POA: Diagnosis not present

## 2022-01-14 DIAGNOSIS — M6281 Muscle weakness (generalized): Secondary | ICD-10-CM | POA: Diagnosis not present

## 2022-01-20 DIAGNOSIS — M6281 Muscle weakness (generalized): Secondary | ICD-10-CM | POA: Diagnosis not present

## 2022-01-22 DIAGNOSIS — M6281 Muscle weakness (generalized): Secondary | ICD-10-CM | POA: Diagnosis not present

## 2022-01-22 DIAGNOSIS — M5136 Other intervertebral disc degeneration, lumbar region: Secondary | ICD-10-CM | POA: Diagnosis not present

## 2022-01-22 DIAGNOSIS — M5416 Radiculopathy, lumbar region: Secondary | ICD-10-CM | POA: Diagnosis not present

## 2022-01-22 DIAGNOSIS — M62830 Muscle spasm of back: Secondary | ICD-10-CM | POA: Diagnosis not present

## 2022-01-22 DIAGNOSIS — M9903 Segmental and somatic dysfunction of lumbar region: Secondary | ICD-10-CM | POA: Diagnosis not present

## 2022-01-26 DIAGNOSIS — M5416 Radiculopathy, lumbar region: Secondary | ICD-10-CM | POA: Diagnosis not present

## 2022-01-26 DIAGNOSIS — M5136 Other intervertebral disc degeneration, lumbar region: Secondary | ICD-10-CM | POA: Diagnosis not present

## 2022-01-26 DIAGNOSIS — M9903 Segmental and somatic dysfunction of lumbar region: Secondary | ICD-10-CM | POA: Diagnosis not present

## 2022-01-26 DIAGNOSIS — M62830 Muscle spasm of back: Secondary | ICD-10-CM | POA: Diagnosis not present

## 2022-01-27 DIAGNOSIS — M6281 Muscle weakness (generalized): Secondary | ICD-10-CM | POA: Diagnosis not present

## 2022-01-29 DIAGNOSIS — M6281 Muscle weakness (generalized): Secondary | ICD-10-CM | POA: Diagnosis not present

## 2022-02-02 DIAGNOSIS — M6281 Muscle weakness (generalized): Secondary | ICD-10-CM | POA: Diagnosis not present

## 2022-02-04 DIAGNOSIS — M6281 Muscle weakness (generalized): Secondary | ICD-10-CM | POA: Diagnosis not present

## 2022-02-09 DIAGNOSIS — M5136 Other intervertebral disc degeneration, lumbar region: Secondary | ICD-10-CM | POA: Diagnosis not present

## 2022-02-09 DIAGNOSIS — M62830 Muscle spasm of back: Secondary | ICD-10-CM | POA: Diagnosis not present

## 2022-02-09 DIAGNOSIS — M5416 Radiculopathy, lumbar region: Secondary | ICD-10-CM | POA: Diagnosis not present

## 2022-02-09 DIAGNOSIS — M9903 Segmental and somatic dysfunction of lumbar region: Secondary | ICD-10-CM | POA: Diagnosis not present

## 2022-02-16 ENCOUNTER — Ambulatory Visit (INDEPENDENT_AMBULATORY_CARE_PROVIDER_SITE_OTHER): Payer: Federal, State, Local not specified - PPO

## 2022-02-16 ENCOUNTER — Encounter: Payer: Self-pay | Admitting: Podiatry

## 2022-02-16 ENCOUNTER — Ambulatory Visit (INDEPENDENT_AMBULATORY_CARE_PROVIDER_SITE_OTHER): Payer: Federal, State, Local not specified - PPO | Admitting: Podiatry

## 2022-02-16 ENCOUNTER — Other Ambulatory Visit: Payer: Self-pay | Admitting: Podiatry

## 2022-02-16 DIAGNOSIS — M5416 Radiculopathy, lumbar region: Secondary | ICD-10-CM | POA: Diagnosis not present

## 2022-02-16 DIAGNOSIS — M66871 Spontaneous rupture of other tendons, right ankle and foot: Secondary | ICD-10-CM

## 2022-02-16 DIAGNOSIS — M2141 Flat foot [pes planus] (acquired), right foot: Secondary | ICD-10-CM | POA: Diagnosis not present

## 2022-02-16 DIAGNOSIS — M778 Other enthesopathies, not elsewhere classified: Secondary | ICD-10-CM

## 2022-02-16 DIAGNOSIS — Q666 Other congenital valgus deformities of feet: Secondary | ICD-10-CM

## 2022-02-16 DIAGNOSIS — M76821 Posterior tibial tendinitis, right leg: Secondary | ICD-10-CM | POA: Diagnosis not present

## 2022-02-16 DIAGNOSIS — S93691A Other sprain of right foot, initial encounter: Secondary | ICD-10-CM | POA: Diagnosis not present

## 2022-02-16 DIAGNOSIS — M6281 Muscle weakness (generalized): Secondary | ICD-10-CM | POA: Diagnosis not present

## 2022-02-16 DIAGNOSIS — M5136 Other intervertebral disc degeneration, lumbar region: Secondary | ICD-10-CM | POA: Diagnosis not present

## 2022-02-16 DIAGNOSIS — M62830 Muscle spasm of back: Secondary | ICD-10-CM | POA: Diagnosis not present

## 2022-02-16 DIAGNOSIS — M9903 Segmental and somatic dysfunction of lumbar region: Secondary | ICD-10-CM | POA: Diagnosis not present

## 2022-02-16 NOTE — Progress Notes (Signed)
Sierra Moore presents today after having not seen her for a couple of years with a chief complaint of pain to the medial aspect of her right foot.  States that her whole foot just hurts when she is standing and walking states that she can only stand or walk for just a few minutes at a time.  Has had total knee replacements over the past year or so by Dr. Marry Guan and has done very well with the knees.  She does relate a history of hip problems.  Her physical therapist has noticed that she is in severe pronation of her right foot.  And states that it almost looks like it is broken.  Objective: Vital signs are stable she is alert and oriented x3 pulses are palpable.  There is no erythema edema cellulitis drainage or odor she does have thick legs and feet and phonatory changes to the bilateral foot.  With gait she has what appears to be a talonavicular breach and severe pronation of that right foot.  I am unable to dislocate the talonavicular joint with stress and she has normal muscle tone.  She is able to go up on her toes without breach in the foot or ankle.  She has tenderness on palpation of the posterior tibial tendon which we had dealt with several years ago.  Assessment pes planovalgus of the right foot probable posterior tibial tendon dysfunction with a questionable spring ligament injury right foot.  Plan: Discussed etiology pathology conservative versus surgical therapies at this point I requested an MRI for surgical consideration so that we do not end up ruining this new knee.  I am also going to request this for differential diagnoses.  I will follow-up with her in the near future in the meantime she is to wear good supportive shoes at all times.  No flip-flops sandals or bare feet.

## 2022-02-19 DIAGNOSIS — M6281 Muscle weakness (generalized): Secondary | ICD-10-CM | POA: Diagnosis not present

## 2022-02-20 ENCOUNTER — Encounter: Payer: Self-pay | Admitting: Podiatry

## 2022-02-23 DIAGNOSIS — F418 Other specified anxiety disorders: Secondary | ICD-10-CM | POA: Diagnosis not present

## 2022-02-23 DIAGNOSIS — M6281 Muscle weakness (generalized): Secondary | ICD-10-CM | POA: Diagnosis not present

## 2022-02-24 DIAGNOSIS — M9903 Segmental and somatic dysfunction of lumbar region: Secondary | ICD-10-CM | POA: Diagnosis not present

## 2022-02-24 DIAGNOSIS — M5416 Radiculopathy, lumbar region: Secondary | ICD-10-CM | POA: Diagnosis not present

## 2022-02-24 DIAGNOSIS — M5136 Other intervertebral disc degeneration, lumbar region: Secondary | ICD-10-CM | POA: Diagnosis not present

## 2022-02-24 DIAGNOSIS — M62830 Muscle spasm of back: Secondary | ICD-10-CM | POA: Diagnosis not present

## 2022-02-25 DIAGNOSIS — M6281 Muscle weakness (generalized): Secondary | ICD-10-CM | POA: Diagnosis not present

## 2022-03-02 DIAGNOSIS — M6281 Muscle weakness (generalized): Secondary | ICD-10-CM | POA: Diagnosis not present

## 2022-03-03 DIAGNOSIS — M5136 Other intervertebral disc degeneration, lumbar region: Secondary | ICD-10-CM | POA: Diagnosis not present

## 2022-03-03 DIAGNOSIS — M62830 Muscle spasm of back: Secondary | ICD-10-CM | POA: Diagnosis not present

## 2022-03-03 DIAGNOSIS — M5416 Radiculopathy, lumbar region: Secondary | ICD-10-CM | POA: Diagnosis not present

## 2022-03-03 DIAGNOSIS — M9903 Segmental and somatic dysfunction of lumbar region: Secondary | ICD-10-CM | POA: Diagnosis not present

## 2022-03-04 ENCOUNTER — Ambulatory Visit
Admission: RE | Admit: 2022-03-04 | Discharge: 2022-03-04 | Disposition: A | Discharge status: Home / Self Care | Payer: Federal, State, Local not specified - PPO | Source: Ambulatory Visit | Attending: Podiatry | Admitting: Podiatry

## 2022-03-04 DIAGNOSIS — M66871 Spontaneous rupture of other tendons, right ankle and foot: Secondary | ICD-10-CM

## 2022-03-04 DIAGNOSIS — R600 Localized edema: Secondary | ICD-10-CM | POA: Diagnosis not present

## 2022-03-04 DIAGNOSIS — S93691A Other sprain of right foot, initial encounter: Secondary | ICD-10-CM

## 2022-03-04 DIAGNOSIS — M67471 Ganglion, right ankle and foot: Secondary | ICD-10-CM | POA: Diagnosis not present

## 2022-03-04 DIAGNOSIS — M2141 Flat foot [pes planus] (acquired), right foot: Secondary | ICD-10-CM

## 2022-03-04 DIAGNOSIS — Q666 Other congenital valgus deformities of feet: Secondary | ICD-10-CM | POA: Diagnosis not present

## 2022-03-04 DIAGNOSIS — M76821 Posterior tibial tendinitis, right leg: Secondary | ICD-10-CM

## 2022-03-05 DIAGNOSIS — M6281 Muscle weakness (generalized): Secondary | ICD-10-CM | POA: Diagnosis not present

## 2022-03-12 DIAGNOSIS — F418 Other specified anxiety disorders: Secondary | ICD-10-CM | POA: Diagnosis not present

## 2022-03-12 NOTE — Progress Notes (Signed)
Faxed request for MRI disc to [facility],confirmation received 03/12/22.requesting that it be sent to [facility] over read to process.

## 2022-03-13 DIAGNOSIS — M6281 Muscle weakness (generalized): Secondary | ICD-10-CM | POA: Diagnosis not present

## 2022-03-18 DIAGNOSIS — M6281 Muscle weakness (generalized): Secondary | ICD-10-CM | POA: Diagnosis not present

## 2022-03-24 ENCOUNTER — Encounter: Payer: Self-pay | Admitting: Emergency Medicine

## 2022-03-24 ENCOUNTER — Emergency Department: Payer: Federal, State, Local not specified - PPO

## 2022-03-24 ENCOUNTER — Emergency Department
Admission: EM | Admit: 2022-03-24 | Discharge: 2022-03-24 | Disposition: A | Discharge status: Home / Self Care | Payer: Federal, State, Local not specified - PPO | Attending: Emergency Medicine | Admitting: Emergency Medicine

## 2022-03-24 ENCOUNTER — Other Ambulatory Visit: Payer: Self-pay

## 2022-03-24 DIAGNOSIS — K5730 Diverticulosis of large intestine without perforation or abscess without bleeding: Secondary | ICD-10-CM | POA: Diagnosis not present

## 2022-03-24 DIAGNOSIS — Z96653 Presence of artificial knee joint, bilateral: Secondary | ICD-10-CM | POA: Diagnosis not present

## 2022-03-24 DIAGNOSIS — R1031 Right lower quadrant pain: Secondary | ICD-10-CM | POA: Insufficient documentation

## 2022-03-24 DIAGNOSIS — Z9049 Acquired absence of other specified parts of digestive tract: Secondary | ICD-10-CM | POA: Diagnosis not present

## 2022-03-24 DIAGNOSIS — M419 Scoliosis, unspecified: Secondary | ICD-10-CM | POA: Diagnosis not present

## 2022-03-24 DIAGNOSIS — R945 Abnormal results of liver function studies: Secondary | ICD-10-CM | POA: Diagnosis not present

## 2022-03-24 DIAGNOSIS — R109 Unspecified abdominal pain: Secondary | ICD-10-CM

## 2022-03-24 DIAGNOSIS — D3500 Benign neoplasm of unspecified adrenal gland: Secondary | ICD-10-CM | POA: Diagnosis not present

## 2022-03-24 DIAGNOSIS — R1011 Right upper quadrant pain: Secondary | ICD-10-CM | POA: Diagnosis not present

## 2022-03-24 DIAGNOSIS — K429 Umbilical hernia without obstruction or gangrene: Secondary | ICD-10-CM | POA: Diagnosis not present

## 2022-03-24 LAB — CBC WITH DIFFERENTIAL/PLATELET
Abs Immature Granulocytes: 0.05 10*3/uL (ref 0.00–0.07)
Basophils Absolute: 0 10*3/uL (ref 0.0–0.1)
Basophils Relative: 0 %
Eosinophils Absolute: 0.1 10*3/uL (ref 0.0–0.5)
Eosinophils Relative: 1 %
HCT: 43.4 % (ref 36.0–46.0)
Hemoglobin: 13.1 g/dL (ref 12.0–15.0)
Immature Granulocytes: 0 %
Lymphocytes Relative: 8 %
Lymphs Abs: 1.2 10*3/uL (ref 0.7–4.0)
MCH: 22.7 pg — ABNORMAL LOW (ref 26.0–34.0)
MCHC: 30.2 g/dL (ref 30.0–36.0)
MCV: 75.2 fL — ABNORMAL LOW (ref 80.0–100.0)
Monocytes Absolute: 0.7 10*3/uL (ref 0.1–1.0)
Monocytes Relative: 5 %
Neutro Abs: 12 10*3/uL — ABNORMAL HIGH (ref 1.7–7.7)
Neutrophils Relative %: 86 %
Platelets: 391 10*3/uL (ref 150–400)
RBC: 5.77 MIL/uL — ABNORMAL HIGH (ref 3.87–5.11)
RDW: 15.5 % (ref 11.5–15.5)
WBC: 14 10*3/uL — ABNORMAL HIGH (ref 4.0–10.5)
nRBC: 0 % (ref 0.0–0.2)

## 2022-03-24 LAB — COMPREHENSIVE METABOLIC PANEL
ALT: 76 U/L — ABNORMAL HIGH (ref 0–44)
AST: 128 U/L — ABNORMAL HIGH (ref 15–41)
Albumin: 3.6 g/dL (ref 3.5–5.0)
Alkaline Phosphatase: 145 U/L — ABNORMAL HIGH (ref 38–126)
Anion gap: 8 (ref 5–15)
BUN: 13 mg/dL (ref 6–20)
CO2: 24 mmol/L (ref 22–32)
Calcium: 8.7 mg/dL — ABNORMAL LOW (ref 8.9–10.3)
Chloride: 106 mmol/L (ref 98–111)
Creatinine, Ser: 0.77 mg/dL (ref 0.44–1.00)
GFR, Estimated: >60 mL/min (ref >60)
Glucose, Bld: 158 mg/dL — ABNORMAL HIGH (ref 70–99)
Potassium: 4.2 mmol/L (ref 3.5–5.1)
Sodium: 138 mmol/L (ref 135–145)
Total Bilirubin: 1 mg/dL (ref 0.3–1.2)
Total Protein: 7.2 g/dL (ref 6.5–8.1)

## 2022-03-24 LAB — URINALYSIS, ROUTINE W REFLEX MICROSCOPIC
Bilirubin Urine: NEGATIVE
Glucose, UA: NEGATIVE mg/dL
Ketones, ur: NEGATIVE mg/dL
Leukocytes,Ua: NEGATIVE
Nitrite: NEGATIVE
Protein, ur: NEGATIVE mg/dL
Specific Gravity, Urine: 1.001 — ABNORMAL LOW (ref 1.005–1.030)
pH: 7 (ref 5.0–8.0)

## 2022-03-24 LAB — LIPASE, BLOOD: Lipase: 35 U/L (ref 11–51)

## 2022-03-24 LAB — D-DIMER, QUANTITATIVE: D-Dimer, Quant: 0.49 ug/mL-FEU (ref 0.00–0.50)

## 2022-03-24 LAB — TROPONIN I (HIGH SENSITIVITY): Troponin I (High Sensitivity): 5 ng/L (ref <18)

## 2022-03-24 MED ORDER — IBUPROFEN 800 MG PO TABS: 800.0000 mg | ORAL_TABLET | Freq: Three times a day (TID) | ORAL | 0 refills | Status: DC | PRN

## 2022-03-24 MED ORDER — OXYCODONE-ACETAMINOPHEN 5-325 MG PO TABS
1.0000 | ORAL_TABLET | Freq: Once | ORAL | Status: AC
  Administered 2022-03-24: 1 via ORAL
  Filled 2022-03-24: qty 1

## 2022-03-24 MED ORDER — ONDANSETRON 4 MG PO TBDP: 4.0000 mg | ORAL_TABLET | Freq: Three times a day (TID) | ORAL | 0 refills | Status: DC | PRN

## 2022-03-24 MED ORDER — OXYCODONE-ACETAMINOPHEN 5-325 MG PO TABS: 1.0000 | ORAL_TABLET | ORAL | 0 refills | Status: DC | PRN

## 2022-03-24 MED ORDER — HYDROMORPHONE HCL 1 MG/ML IJ SOLN
0.5000 mg | Freq: Once | INTRAMUSCULAR | Status: AC
  Administered 2022-03-24: 0.5 mg via INTRAVENOUS
  Filled 2022-03-24: qty 0.5

## 2022-03-24 MED ORDER — DIAZEPAM 2 MG PO TABS: 2.0000 mg | ORAL_TABLET | Freq: Three times a day (TID) | ORAL | 0 refills | Status: DC | PRN

## 2022-03-24 MED ORDER — ONDANSETRON HCL 4 MG/2ML IJ SOLN
4.0000 mg | Freq: Once | INTRAMUSCULAR | Status: AC
  Administered 2022-03-24: 4 mg via INTRAVENOUS
  Filled 2022-03-24: qty 2

## 2022-03-24 MED ORDER — SODIUM CHLORIDE 0.9 % IV BOLUS
1000.0000 mL | Freq: Once | INTRAVENOUS | Status: AC
  Administered 2022-03-24: 1000 mL via INTRAVENOUS

## 2022-03-24 MED ORDER — KETOROLAC TROMETHAMINE 30 MG/ML IJ SOLN
15.0000 mg | Freq: Once | INTRAMUSCULAR | Status: AC
  Administered 2022-03-24: 15 mg via INTRAVENOUS
  Filled 2022-03-24: qty 1

## 2022-03-24 NOTE — Discharge Instructions (Signed)
You may take medicines as needed for pain and muscle spasms.  Take Zofran as needed for nausea.  Apply moist heat to affected area several times daily.  Return to the ER for worsening symptoms, persistent vomiting, difficulty breathing or other concerns.

## 2022-03-24 NOTE — ED Provider Notes (Signed)
[facility] Provider Note    Event Date/Time   First MD Initiated Contact with Patient 03/24/22 0244     (approximate)   History   Flank Pain   HPI  Sierra Moore is a 51 y.o. female who presents to the ED from home with a chief complaint of right flank to right lower quadrant abdominal pain since yesterday.  History of kidney stones but to her knowledge she has never passed one.  Endorses associated nausea without vomiting and urinary frequency.  Denies fever, chills, chest pain, shortness of breath, hematuria or diarrhea.     Past Medical History   Past Medical History:  Diagnosis Date   Allergy    Arthritis    POLYARTHRALGIA   GERD (gastroesophageal reflux disease)    occ   History of kidney stones    currently as of 12-25-20   Leg swelling    Obesity    PMDD (premenstrual dysphoric disorder)    PMDD (premenstrual dysphoric disorder)    Raynaud disease    Vitamin D deficiency      Active Problem List   Patient Active Problem List   Diagnosis Date Noted   Total knee replacement status 11/24/2021   Left lower quadrant abdominal pain 04/21/2021   S/P total knee arthroplasty, right 01/03/2021   Marital problem 12/30/2020   Primary osteoarthritis of both knees 05/09/2019   Primary osteoarthritis of left knee 05/09/2019   Special screening for malignant neoplasms, colon    Polyp of descending colon    Other chest pain    Onychomycosis of toenail 12/27/2018   Polyarthralgia 12/27/2018   HLD (hyperlipidemia) 12/21/2018   Right foot pain 12/07/2018   Eczema 03/04/2017   Raynaud's phenomenon without gangrene 03/04/2017   Dysuria 09/25/2016   Mixed stress and urge urinary incontinence 09/25/2016   Mild obesity 03/04/2016   Seasonal allergies 02/10/2016   Encounter for routine adult physical exam with abnormal findings 02/10/2016   Heat intolerance 02/10/2016   Hematuria 02/10/2016   Vitamin D deficiency 10/25/2015   History of  prediabetes 10/25/2015   IBS (irritable bowel syndrome) 10/24/2015     Past Surgical History   Past Surgical History:  Procedure Laterality Date   COLONOSCOPY WITH PROPOFOL N/A 04/27/2019   Procedure: COLONOSCOPY WITH BIOPSY;  Surgeon: Lucilla Lame, MD;  Location: [place];  Service: Endoscopy;  Laterality: N/A;   ESOPHAGOGASTRODUODENOSCOPY (EGD) WITH PROPOFOL N/A 04/27/2019   Procedure: ESOPHAGOGASTRODUODENOSCOPY (EGD) WITH PROPOFOL;  Surgeon: Lucilla Lame, MD;  Location: [place];  Service: Endoscopy;  Laterality: N/A;   KNEE ARTHROPLASTY Right 01/03/2021   Procedure: COMPUTER ASSISTED TOTAL KNEE ARTHROPLASTY - RNFA;  Surgeon: Dereck Leep, MD;  Location: [facility];  Service: Orthopedics;  Laterality: Right;   KNEE ARTHROPLASTY Left 11/24/2021   Procedure: COMPUTER ASSISTED TOTAL KNEE ARTHROPLASTY;  Surgeon: Dereck Leep, MD;  Location: [facility];  Service: Orthopedics;  Laterality: Left;   POLYPECTOMY N/A 04/27/2019   Procedure: POLYPECTOMY;  Surgeon: Lucilla Lame, MD;  Location: [place];  Service: Endoscopy;  Laterality: N/A;   ROBOTIC ASSISTED LAPAROSCOPIC CHOLECYSTECTOMY N/A 01/10/2019   Procedure: ROBOTIC ASSISTED LAPAROSCOPIC CHOLECYSTECTOMY;  Surgeon: Jules Husbands, MD;  Location: [facility];  Service: General;  Laterality: N/A;   TONSILLECTOMY       Home Medications   Prior to Admission medications   Medication Sig Start Date End Date Taking? Authorizing Provider  acetaminophen (TYLENOL) 500 MG tablet Take 500-1,000 mg by mouth every  6 (six) hours as needed for moderate pain.    [provider]  cholecalciferol (VITAMIN D3) 25 MCG (1000 UNIT) tablet Take 1,000 Units by mouth daily.    [provider]  diclofenac Sodium (VOLTAREN) 1 % GEL Apply 2 g topically 4 (four) times daily.    [provider]  enoxaparin (LOVENOX) 40 MG/0.4ML injection Inject 0.4 mLs (40 mg total) into the skin daily for 14 days. 11/25/21 12/09/21   Tamala Julian B, PA-C  LESSINA-28 0.1-20 MG-MCG tablet TAKE 1 TABLET BY MOUTH EVERY DAY CONTINUOUSLY 10/28/21   Philip Aspen, CNM  meloxicam (MOBIC) 7.5 MG tablet Take 7.5 mg by mouth 2 (two) times daily. 01/13/22   [provider]  omeprazole (PRILOSEC OTC) 20 MG tablet Take 20 mg by mouth daily as needed (heartburn).    [provider]  oxyCODONE (OXY IR/ROXICODONE) 5 MG immediate release tablet Take 1 tablet (5 mg total) by mouth every 4 (four) hours as needed for severe pain. 11/25/21   Fausto Skillern, PA-C  traMADol (ULTRAM) 50 MG tablet Take 1 tablet (50 mg total) by mouth every 4 (four) hours as needed for moderate pain. 11/25/21   Fausto Skillern, PA-C     Allergies  Penicillins   Family History   Family History  Problem Relation Age of Onset   Arthritis Mother    Raynaud syndrome Mother    Arthritis Father    Breast cancer Neg Hx    Bladder Cancer Neg Hx    Kidney cancer Neg Hx      Physical Exam  Triage Vital Signs: ED Triage Vitals  Enc Vitals Group     BP 03/24/22 0243 (!) 150/81     Pulse Rate 03/24/22 0243 100     Resp 03/24/22 0243 18     Temp 03/24/22 0243 98.1 F (36.7 C)     Temp Source 03/24/22 0243 Oral     SpO2 03/24/22 0243 95 %     Weight 03/24/22 0242 200 lb (90.7 kg)     Height 03/24/22 0242 '5\' 1"'$  (1.549 m)     Head Circumference --      Peak Flow --      Pain Score 03/24/22 0242 9     Pain Loc --      Pain Edu? --      Excl. in [place]? --     Updated Vital Signs: BP (!) 157/83   Pulse 84   Temp 98.1 F (36.7 C) (Oral)   Resp 20   Ht '5\' 1"'$  (1.549 m)   Wt 90.7 kg   LMP  (LMP Unknown)   SpO2 100%   BMI 37.79 kg/m    General: Awake, moderate distress.  Appears uncomfortable. CV:  RRR.  Good peripheral perfusion.  Resp:  Normal effort.  CTA B. Abd:  Mild right CVAT.  Abdomen nontender to light or deep palpation.  No distention.  Other:  No truncal vesicles.   ED Results / Procedures / Treatments  Labs (all  labs ordered are listed, but only abnormal results are displayed) Labs Reviewed  CBC WITH DIFFERENTIAL/PLATELET - Abnormal; Notable for the following components:      Result Value   WBC 14.0 (*)    RBC 5.77 (*)    MCV 75.2 (*)    MCH 22.7 (*)    Neutro Abs 12.0 (*)    All other components within normal limits  COMPREHENSIVE METABOLIC PANEL - Abnormal; Notable for the following  components:   Glucose, Bld 158 (*)    Calcium 8.7 (*)    AST 128 (*)    ALT 76 (*)    Alkaline Phosphatase 145 (*)    All other components within normal limits  URINALYSIS, ROUTINE W REFLEX MICROSCOPIC - Abnormal; Notable for the following components:   Color, Urine STRAW (*)    APPearance CLEAR (*)    Specific Gravity, Urine 1.001 (*)    Hgb urine dipstick SMALL (*)    Bacteria, UA RARE (*)    All other components within normal limits  LIPASE, BLOOD  D-DIMER, QUANTITATIVE  TROPONIN I (HIGH SENSITIVITY)     EKG  ED ECG REPORT I, Wil Slape J, the attending physician, personally viewed and interpreted this ECG.   Date: 03/24/2022  EKG Time: 0541  Rate: 88  Rhythm: normal sinus rhythm  Axis: Normal  Intervals:none  ST&T Change: Nonspecific    RADIOLOGY I have independently visualized and interpreted patient's CT as well as noted the radiology interpretation:  CT renal colic: No acute abnormality  Ultrasound: Mildly dilated CBD after cholecystectomy; no visible choledocholithiasis  Official radiology report(s): US ABDOMEN LIMITED RUQ (LIVER/GB)  Result Date: 03/24/2022 CLINICAL DATA:  Right upper quadrant pain after cholecystectomy. Elevated liver function tests EXAM: ULTRASOUND ABDOMEN LIMITED RIGHT UPPER QUADRANT COMPARISON:  Abdominal CT from earlier today FINDINGS: Gallbladder: Cholecystectomy Common bile duct: Diameter: 1 cm common bile duct diameter. Where visualized, no filling defect. Liver: No focal lesion identified. Within normal limits in parenchymal echogenicity. Portal vein is  patent on color Doppler imaging with normal direction of blood flow towards the liver. IMPRESSION: Mildly dilated CBD after cholecystectomy. No visible choledocholithiasis. Electronically Signed   By: Jorje Guild M.D.   On: 03/24/2022 05:50   CT Renal Stone Study  Result Date: 03/24/2022 CLINICAL DATA:  Flank pain with kidney stone suspected. EXAM: CT ABDOMEN AND PELVIS WITHOUT CONTRAST TECHNIQUE: Multidetector CT imaging of the abdomen and pelvis was performed following the standard protocol without IV contrast. RADIATION DOSE REDUCTION: This exam was performed according to the departmental dose-optimization program which includes automated exposure control, adjustment of the mA and/or kV according to patient size and/or use of iterative reconstruction technique. COMPARISON:  CTs with contrast 04/21/2021 and 03/09/2019. FINDINGS: Lower chest: No acute abnormality. Hepatobiliary: No focal liver abnormality is seen without contrast. Status post cholecystectomy. No biliary dilatation. Pancreas: Unremarkable without contrast. Spleen: Unremarkable without contrast. Adrenals/Urinary Tract: Stable 1.2 cm left adrenal adenoma of 6.9 Hounsfield units, no right adrenal lesion is seen. The unenhanced renal cortex is unremarkable. There are bilateral punctate nonobstructive caliceal stones. There is no ureteral stone or hydronephrosis either side, no intravesical stone or bladder thickening. Stomach/Bowel: No dilatation or wall thickening including the appendix. There is diverticulosis of the left-sided colon without findings of acute diverticulitis. Vascular/Lymphatic: Unremarkable abdominal aorta. Mild calcification in the common iliac arteries. No adenopathy is seen. Reproductive: Uterus and bilateral adnexa are unremarkable. Other: Small umbilical fat hernia. There is no free air, free hemorrhage or free fluid. Musculoskeletal: There are mild degenerative changes of the spine but no concerning regional bone lesion  or acute osseous findings. Slight lumbar dextroscoliosis. IMPRESSION: 1. No acute noncontrast CT findings in the abdomen or pelvis. 2. Nonobstructive bilateral micronephrolithiasis. 3. Diverticulosis without evidence of acute diverticulitis. Electronically Signed   By: Telford Nab M.D.   On: 03/24/2022 04:09     PROCEDURES:  Critical Care performed: No  Procedures   MEDICATIONS ORDERED IN ED: Medications  sodium chloride 0.9 % bolus 1,000 mL (1,000 mLs Intravenous New Bag/Given 03/24/22 0325)  ondansetron (ZOFRAN) injection 4 mg (4 mg Intravenous Given 03/24/22 0321)  ketorolac (TORADOL) 30 MG/ML injection 15 mg (15 mg Intravenous Given 03/24/22 0322)  HYDROmorphone (DILAUDID) injection 0.5 mg (0.5 mg Intravenous Given 03/24/22 0447)  ondansetron (ZOFRAN) injection 4 mg (4 mg Intravenous Given 03/24/22 0447)     IMPRESSION / MDM / ASSESSMENT AND PLAN / ED COURSE  I reviewed the triage vital signs and the nursing notes.                             51 year old female presenting with right flank/abdominal pain. Differential diagnosis includes, but is not limited to, ovarian cyst, ovarian torsion, acute appendicitis, diverticulitis, urinary tract infection/pyelonephritis, endometriosis, bowel obstruction, colitis, renal colic, gastroenteritis, hernia, fibroids, etc. I have personally reviewed patient's records and note a podiatry office visit for posterior tibial tendon tear on 02/16/2022.  Patient's presentation is most consistent with acute presentation with potential threat to life or bodily function.  The patient is on the cardiac monitor to evaluate for evidence of arrhythmia and/or significant heart rate changes.  We will obtain basic lab work, urine, CT renal colic study.  Initiate IV fluid resuscitation, IV ketorolac for pain paired with IV Zofran for nausea.  Will reassess.  Clinical Course as of 03/24/22 0621  Tue Mar 24, 2022  0442 No significant change in pain.  Laboratory  results demonstrate moderate leukocytosis with WBC 14, mildly elevated transaminases with normal bilirubin; will obtain right upper quadrant abdominal ultrasound to evaluate retained stone.  Add D-dimer for patient's complaint of flank pain. [JS]  0505 Dimer is unremarkable [JS]  0521 Significant relief after IV Dilaudid.  Ultrasound in progress. [JS]  0620 Updated patient on negative ultrasound result.  Pain is well controlled currently.  Unclear etiology of patient's symptoms, but given urinary frequency have recommended she follow-up with her urologist.  Will discharge home with as needed pain and nausea medicines.  Strict return precautions given.  Patient verbalizes understanding and agrees with plan of care. [JS]    Clinical Course User Index [JS] Paulette Blanch, MD     FINAL CLINICAL IMPRESSION(S) / ED DIAGNOSES   Final diagnoses:  Right flank pain     Rx / DC Orders   ED Discharge Orders     None        Note:  This document was prepared using Dragon voice recognition software and may include unintentional dictation errors.   Paulette Blanch, MD 03/24/22 [phone]

## 2022-03-24 NOTE — ED Triage Notes (Signed)
Patient ambulatory to triage with steady gait, without difficulty, appears uncomfortable; pt reports rt abd/flank pain since yesterday; st hx kidney stones

## 2022-03-26 DIAGNOSIS — F418 Other specified anxiety disorders: Secondary | ICD-10-CM | POA: Diagnosis not present

## 2022-04-01 ENCOUNTER — Encounter: Payer: Self-pay | Admitting: Podiatry

## 2022-04-01 ENCOUNTER — Ambulatory Visit: Payer: Federal, State, Local not specified - PPO | Admitting: Podiatry

## 2022-04-01 ENCOUNTER — Encounter: Payer: Self-pay | Admitting: *Deleted

## 2022-04-01 DIAGNOSIS — M2141 Flat foot [pes planus] (acquired), right foot: Secondary | ICD-10-CM

## 2022-04-01 DIAGNOSIS — M76821 Posterior tibial tendinitis, right leg: Secondary | ICD-10-CM

## 2022-04-01 DIAGNOSIS — M722 Plantar fascial fibromatosis: Secondary | ICD-10-CM | POA: Diagnosis not present

## 2022-04-01 MED ORDER — TRIAMCINOLONE ACETONIDE 40 MG/ML IJ SUSP
20.0000 mg | Freq: Once | INTRAMUSCULAR | Status: AC
  Administered 2022-04-01: 20 mg

## 2022-04-01 NOTE — Progress Notes (Addendum)
She presents today for follow-up of her MRI for her posterior tibial tendinitis and posterior tibial tendon dysfunction.  She states that it feels about the same she states that it really has not improved at all.  Objective: Vital signs are stable she is alert and oriented x3.  Pulses are palpable.  She still has tenderness on palpation of the posterior tibial tendon and severe flatfoot deformity right.  MRI does demonstrate posterior tibial tendinitis tendinosis and synovitis.  MRI also demonstrated Planter fasciitis.  Assessment: Pes planovalgus right posterior tibial tendinitis posterior tibial tendon dysfunction and synovitis.  Planter fasciitis.  Plan: At this point I highly recommended an injection.  I injected the plantar fascial area today with 10 mg Kenalog 5 mg Marcaine and have her scheduled for orthotics.

## 2022-04-03 ENCOUNTER — Ambulatory Visit (INDEPENDENT_AMBULATORY_CARE_PROVIDER_SITE_OTHER): Payer: Federal, State, Local not specified - PPO | Admitting: Podiatry

## 2022-04-03 DIAGNOSIS — M2141 Flat foot [pes planus] (acquired), right foot: Secondary | ICD-10-CM | POA: Diagnosis not present

## 2022-04-03 DIAGNOSIS — M722 Plantar fascial fibromatosis: Secondary | ICD-10-CM | POA: Diagnosis not present

## 2022-04-03 NOTE — Progress Notes (Signed)
Patient presents today to be casted for custom molded orthotics. Dr. Milinda Pointer has been treating patient for Pes Planovalgus and Plantar Fasciitis.   Impression foam cast was taken.  DME form signed.  Patient info-  Shoe size: 7 / 7.5 M  Weight: 200 lbs  Insurance: [employer]   Patient will be notified once orthotics arrive in office and reappoint for fitting at that time.

## 2022-04-23 DIAGNOSIS — F418 Other specified anxiety disorders: Secondary | ICD-10-CM | POA: Diagnosis not present

## 2022-04-30 ENCOUNTER — Telehealth: Payer: Self-pay | Admitting: Podiatry

## 2022-05-08 ENCOUNTER — Ambulatory Visit: Payer: Federal, State, Local not specified - PPO | Admitting: *Deleted

## 2022-05-08 DIAGNOSIS — M2141 Flat foot [pes planus] (acquired), right foot: Secondary | ICD-10-CM

## 2022-05-08 DIAGNOSIS — M722 Plantar fascial fibromatosis: Secondary | ICD-10-CM

## 2022-05-08 NOTE — Progress Notes (Signed)
Patient presents today to pick up custom molded foot orthotics recommended by Dr. Milinda Pointer.   Orthotics were dispensed and fit was satisfactory. Reviewed instructions for break-in and wear. Written instructions given to patient.  Patient will follow up as needed.   [facility] - order # [id number]

## 2022-05-14 DIAGNOSIS — F418 Other specified anxiety disorders: Secondary | ICD-10-CM | POA: Diagnosis not present

## 2022-05-26 DIAGNOSIS — Z96652 Presence of left artificial knee joint: Secondary | ICD-10-CM | POA: Diagnosis not present

## 2022-05-26 DIAGNOSIS — E119 Type 2 diabetes mellitus without complications: Secondary | ICD-10-CM | POA: Diagnosis not present

## 2022-06-04 DIAGNOSIS — F418 Other specified anxiety disorders: Secondary | ICD-10-CM | POA: Diagnosis not present

## 2022-06-24 ENCOUNTER — Ambulatory Visit
Admission: RE | Admit: 2022-06-24 | Discharge: 2022-06-24 | Disposition: A | Discharge status: Home / Self Care | Payer: Federal, State, Local not specified - PPO | Source: Ambulatory Visit | Attending: Emergency Medicine | Admitting: Emergency Medicine

## 2022-06-24 VITALS — BP 139/86 | HR 105 | Temp 98.1°F | Resp 18 | Ht 61.0 in | Wt 200.0 lb

## 2022-06-24 DIAGNOSIS — J069 Acute upper respiratory infection, unspecified: Secondary | ICD-10-CM | POA: Diagnosis not present

## 2022-06-24 MED ORDER — AEROCHAMBER PLUS FLO-VU MEDIUM MISC
1.0000 | Freq: Once | Status: AC
  Administered 2022-06-24: 1

## 2022-06-24 MED ORDER — BENZONATATE 100 MG PO CAPS: 100.0000 mg | ORAL_CAPSULE | Freq: Three times a day (TID) | ORAL | 0 refills | Status: DC | PRN

## 2022-06-24 MED ORDER — ALBUTEROL SULFATE HFA 108 (90 BASE) MCG/ACT IN AERS
2.0000 | INHALATION_SPRAY | Freq: Once | RESPIRATORY_TRACT | Status: AC
  Administered 2022-06-24: 2 via RESPIRATORY_TRACT

## 2022-06-24 MED ORDER — PREDNISONE 10 MG (21) PO TBPK: ORAL_TABLET | Freq: Every day | ORAL | 0 refills | Status: DC

## 2022-06-24 NOTE — ED Triage Notes (Signed)
Patient to Urgent Care with complaints of nasal congestion, post-nasal drip, productive cough. Green mucus production.  Denies any known fevers.   Symptoms started Sunday night.   Has been taking zyrtec.

## 2022-06-24 NOTE — ED Provider Notes (Signed)
Sierra Moore    CSN: [id number] Arrival date & time: 06/24/22  1259      History   Chief Complaint Chief Complaint  Patient presents with   Nasal Congestion    Entered by patient    HPI Sierra Moore is a 51 y.o. female.  Patient presents with 2-3 day history of congestion and cough.  She reports wheezing at night.  No fever, chills, shortness of breath, vomiting, diarrhea, or other symptoms.  Treatment at home with Zyrtec.  Her medical history includes seasonal allergies, GERD, kidney stones, osteoarthritis.   The history is provided by the patient and medical records.    Past Medical History:  Diagnosis Date   Allergy    Arthritis    POLYARTHRALGIA   GERD (gastroesophageal reflux disease)    occ   History of kidney stones    currently as of 12-25-20   Leg swelling    Obesity    PMDD (premenstrual dysphoric disorder)    PMDD (premenstrual dysphoric disorder)    Raynaud disease    Vitamin D deficiency     Patient Active Problem List   Diagnosis Date Noted   Total knee replacement status 11/24/2021   Left lower quadrant abdominal pain 04/21/2021   S/P total knee arthroplasty, right 01/03/2021   Marital problem 12/30/2020   Primary osteoarthritis of both knees 05/09/2019   Primary osteoarthritis of left knee 05/09/2019   Special screening for malignant neoplasms, colon    Polyp of descending colon    Other chest pain    Onychomycosis of toenail 12/27/2018   Polyarthralgia 12/27/2018   HLD (hyperlipidemia) 12/21/2018   Right foot pain 12/07/2018   Eczema 03/04/2017   Raynaud's phenomenon without gangrene 03/04/2017   Dysuria 09/25/2016   Mixed stress and urge urinary incontinence 09/25/2016   Mild obesity 03/04/2016   Seasonal allergies 02/10/2016   Encounter for routine adult physical exam with abnormal findings 02/10/2016   Heat intolerance 02/10/2016   Hematuria 02/10/2016   Vitamin D deficiency 10/25/2015   History of prediabetes  10/25/2015   IBS (irritable bowel syndrome) 10/24/2015    Past Surgical History:  Procedure Laterality Date   COLONOSCOPY WITH PROPOFOL N/A 04/27/2019   Procedure: COLONOSCOPY WITH BIOPSY;  Surgeon: Lucilla Lame, MD;  Location: [place];  Service: Endoscopy;  Laterality: N/A;   ESOPHAGOGASTRODUODENOSCOPY (EGD) WITH PROPOFOL N/A 04/27/2019   Procedure: ESOPHAGOGASTRODUODENOSCOPY (EGD) WITH PROPOFOL;  Surgeon: Lucilla Lame, MD;  Location: [place];  Service: Endoscopy;  Laterality: N/A;   KNEE ARTHROPLASTY Right 01/03/2021   Procedure: COMPUTER ASSISTED TOTAL KNEE ARTHROPLASTY - RNFA;  Surgeon: Dereck Leep, MD;  Location: [facility];  Service: Orthopedics;  Laterality: Right;   KNEE ARTHROPLASTY Left 11/24/2021   Procedure: COMPUTER ASSISTED TOTAL KNEE ARTHROPLASTY;  Surgeon: Dereck Leep, MD;  Location: [facility];  Service: Orthopedics;  Laterality: Left;   POLYPECTOMY N/A 04/27/2019   Procedure: POLYPECTOMY;  Surgeon: Lucilla Lame, MD;  Location: [place];  Service: Endoscopy;  Laterality: N/A;   ROBOTIC ASSISTED LAPAROSCOPIC CHOLECYSTECTOMY N/A 01/10/2019   Procedure: ROBOTIC ASSISTED LAPAROSCOPIC CHOLECYSTECTOMY;  Surgeon: Jules Husbands, MD;  Location: [facility];  Service: General;  Laterality: N/A;   TONSILLECTOMY      OB History     Gravida  2   Para  2   Term  2   Preterm      AB      Living  2      SAB  IAB      Ectopic      Multiple      Live Births  2            Home Medications    Prior to Admission medications   Medication Sig Start Date End Date Taking? Authorizing Provider  benzonatate (TESSALON) 100 MG capsule Take 1 capsule (100 mg total) by mouth 3 (three) times daily as needed for cough. 06/24/22  Yes Sharion Balloon, NP  predniSONE (STERAPRED UNI-PAK 21 TAB) 10 MG (21) TBPK tablet Take by mouth daily. As directed 06/24/22  Yes Sharion Balloon, NP  acetaminophen (TYLENOL) 500 MG tablet Take 500-1,000 mg by  mouth every 6 (six) hours as needed for moderate pain.    [provider]  cholecalciferol (VITAMIN D3) 25 MCG (1000 UNIT) tablet Take 1,000 Units by mouth daily.    [provider]  diazepam (VALIUM) 2 MG tablet Take 1 tablet (2 mg total) by mouth every 8 (eight) hours as needed for muscle spasms. 03/24/22   Paulette Blanch, MD  diclofenac Sodium (VOLTAREN) 1 % GEL Apply 2 g topically 4 (four) times daily.    [provider]  enoxaparin (LOVENOX) 40 MG/0.4ML injection Inject 0.4 mLs (40 mg total) into the skin daily for 14 days. 11/25/21 12/09/21  Tamala Julian B, PA-C  ibuprofen (ADVIL) 800 MG tablet Take 1 tablet (800 mg total) by mouth every 8 (eight) hours as needed for moderate pain. 03/24/22   Paulette Blanch, MD  LESSINA-28 0.1-20 MG-MCG tablet TAKE 1 TABLET BY MOUTH EVERY DAY CONTINUOUSLY 10/28/21   Philip Aspen, CNM  meloxicam (MOBIC) 7.5 MG tablet Take 7.5 mg by mouth 2 (two) times daily. 01/13/22   [provider]  omeprazole (PRILOSEC OTC) 20 MG tablet Take 20 mg by mouth daily as needed (heartburn).    [provider]  ondansetron (ZOFRAN-ODT) 4 MG disintegrating tablet Take 1 tablet (4 mg total) by mouth every 8 (eight) hours as needed for nausea or vomiting. 03/24/22   Paulette Blanch, MD  oxyCODONE (OXY IR/ROXICODONE) 5 MG immediate release tablet Take 1 tablet (5 mg total) by mouth every 4 (four) hours as needed for severe pain. 11/25/21   Fausto Skillern, PA-C  oxyCODONE-acetaminophen (PERCOCET/ROXICET) 5-325 MG tablet Take 1 tablet by mouth every 4 (four) hours as needed for severe pain. 03/24/22   Paulette Blanch, MD  traMADol (ULTRAM) 50 MG tablet Take 1 tablet (50 mg total) by mouth every 4 (four) hours as needed for moderate pain. 11/25/21   Fausto Skillern, PA-C    Family History Family History  Problem Relation Age of Onset   Arthritis Mother    Raynaud syndrome Mother    Arthritis Father    Breast cancer Neg Hx    Bladder Cancer Neg Hx     Kidney cancer Neg Hx     Social History Social History   Tobacco Use   Smoking status: Former    Packs/day: 0.50    Years: 10.00    Total pack years: 5.00    Types: Cigarettes    Quit date: 2005    Years since quitting: 18.9   Smokeless tobacco: Never  Vaping Use   Vaping Use: Never used  Substance Use Topics   Alcohol use: No   Drug use: No     Allergies   Penicillins   Review of Systems Review of Systems  Constitutional:  Negative for chills and fever.  HENT:  Positive for congestion. Negative for ear pain and sore throat.   Respiratory:  Positive for cough and wheezing. Negative for shortness of breath.   Cardiovascular:  Negative for chest pain and palpitations.  Gastrointestinal:  Negative for diarrhea and vomiting.  Skin:  Negative for color change and rash.  All other systems reviewed and are negative.    Physical Exam Triage Vital Signs ED Triage Vitals [06/24/22 1319]  Enc Vitals Group     BP      Pulse Rate (!) 105     Resp 18     Temp 98.1 F (36.7 C)     Temp src      SpO2 95 %     Weight      Height      Head Circumference      Peak Flow      Pain Score      Pain Loc      Pain Edu?      Excl. in [place]?    No data found.  Updated Vital Signs BP 139/86   Pulse (!) 105   Temp 98.1 F (36.7 C)   Resp 18   Ht '5\' 1"'$  (1.549 m)   Wt 200 lb (90.7 kg)   SpO2 95%   BMI 37.79 kg/m   Visual Acuity Right Eye Distance:   Left Eye Distance:   Bilateral Distance:    Right Eye Near:   Left Eye Near:    Bilateral Near:     Physical Exam Vitals and nursing note reviewed.  Constitutional:      General: She is not in acute distress.    Appearance: She is well-developed. She is not ill-appearing.  HENT:     Right Ear: Tympanic membrane normal.     Left Ear: Tympanic membrane normal.     Nose: Nose normal.     Mouth/Throat:     Mouth: Mucous membranes are moist.     Pharynx: Oropharynx is clear.     Comments: Clear  PND. Cardiovascular:     Rate and Rhythm: Normal rate and regular rhythm.     Heart sounds: Normal heart sounds.  Pulmonary:     Effort: Pulmonary effort is normal. No respiratory distress.     Breath sounds: Normal breath sounds. No wheezing.  Musculoskeletal:     Cervical back: Neck supple.  Skin:    General: Skin is warm and dry.  Neurological:     Mental Status: She is alert.  Psychiatric:        Mood and Affect: Mood normal.        Behavior: Behavior normal.      UC Treatments / Results  Labs (all labs ordered are listed, but only abnormal results are displayed) Labs Reviewed - No data to display  EKG   Radiology No results found.  Procedures Procedures (including critical care time)  Medications Ordered in UC Medications  albuterol (VENTOLIN HFA) 108 (90 Base) MCG/ACT inhaler 2 puff (2 puffs Inhalation Given 06/24/22 1349)  AeroChamber Plus Flo-Vu Medium MISC 1 each (1 each Other Given 06/24/22 1349)    Initial Impression / Assessment and Plan / UC Course  I have reviewed the triage vital signs and the nursing notes.  Pertinent labs & imaging results that were available during my care of the patient were reviewed by me and considered in my medical decision making (see chart for details).   Viral URI.  Patient declines COVID or flu testing.  Patient reports she has used inhaler in the past but has difficulty getting the medication to her lungs instead of her mouth.  Albuterol inhaler and spacer provided here with instructions for use.  Also treating with prednisone and Tessalon Perles.  Instructed patient to follow up with her PCP if her symptoms are not improving.  She agrees to plan of care.    Final Clinical Impressions(s) / UC Diagnoses   Final diagnoses:  Viral URI     Discharge Instructions      Use the albuterol inhaler with spacer as directed.  Take the prednisone and Tessalon Perles as directed.  Follow up with your primary care provider if your  symptoms are not improving.        ED Prescriptions     Medication Sig Dispense Auth. Provider   predniSONE (STERAPRED UNI-PAK 21 TAB) 10 MG (21) TBPK tablet Take by mouth daily. As directed 21 tablet Sharion Balloon, NP   benzonatate (TESSALON) 100 MG capsule Take 1 capsule (100 mg total) by mouth 3 (three) times daily as needed for cough. 21 capsule Sharion Balloon, NP      PDMP not reviewed this encounter.   Sharion Balloon, NP 06/24/22 1352

## 2022-06-24 NOTE — Discharge Instructions (Addendum)
Use the albuterol inhaler with spacer as directed.  Take the prednisone and Tessalon Perles as directed.  Follow up with your primary care provider if your symptoms are not improving.

## 2022-07-17 DIAGNOSIS — F418 Other specified anxiety disorders: Secondary | ICD-10-CM | POA: Diagnosis not present

## 2022-08-06 DIAGNOSIS — F413 Other mixed anxiety disorders: Secondary | ICD-10-CM | POA: Diagnosis not present

## 2022-09-17 DIAGNOSIS — F411 Generalized anxiety disorder: Secondary | ICD-10-CM | POA: Diagnosis not present

## 2022-10-05 DIAGNOSIS — F411 Generalized anxiety disorder: Secondary | ICD-10-CM | POA: Diagnosis not present

## 2022-10-07 ENCOUNTER — Telehealth: Payer: Self-pay | Admitting: Certified Nurse Midwife

## 2022-10-07 NOTE — Telephone Encounter (Signed)
Left message for patient to call office back to r/s appt with AT on 11/03/22. Deneise Lever will be out of the office that day. [facility] message sent

## 2022-10-08 NOTE — Telephone Encounter (Signed)
I contacted the patient via phone. I left voicemail for the patient to call back to be rescheduled.

## 2022-10-19 DIAGNOSIS — F413 Other mixed anxiety disorders: Secondary | ICD-10-CM | POA: Diagnosis not present

## 2022-11-03 ENCOUNTER — Encounter: Payer: Self-pay | Admitting: Certified Nurse Midwife

## 2022-11-04 ENCOUNTER — Ambulatory Visit (INDEPENDENT_AMBULATORY_CARE_PROVIDER_SITE_OTHER): Payer: Federal, State, Local not specified - PPO | Admitting: Certified Nurse Midwife

## 2022-11-04 ENCOUNTER — Encounter: Payer: Self-pay | Admitting: Certified Nurse Midwife

## 2022-11-04 VITALS — BP 143/84 | HR 88 | Ht 61.0 in | Wt 215.5 lb

## 2022-11-04 DIAGNOSIS — Z01419 Encounter for gynecological examination (general) (routine) without abnormal findings: Secondary | ICD-10-CM

## 2022-11-04 DIAGNOSIS — Z1231 Encounter for screening mammogram for malignant neoplasm of breast: Secondary | ICD-10-CM

## 2022-11-04 DIAGNOSIS — R5383 Other fatigue: Secondary | ICD-10-CM | POA: Diagnosis not present

## 2022-11-04 DIAGNOSIS — Z862 Personal history of diseases of the blood and blood-forming organs and certain disorders involving the immune mechanism: Secondary | ICD-10-CM | POA: Diagnosis not present

## 2022-11-04 MED ORDER — PREMPRO 0.3-1.5 MG PO TABS: 1.0000 | ORAL_TABLET | Freq: Every day | ORAL | 5 refills | Status: DC

## 2022-11-04 NOTE — Progress Notes (Signed)
GYNECOLOGY ANNUAL PREVENTATIVE CARE ENCOUNTER NOTE  History:     Sierra Moore is a 52 y.o. G22P2002 female here for a routine annual gynecologic exam.  Current complaints: fatigue , history of anemia, requesting labs today.   Denies abnormal vaginal bleeding, discharge, pelvic pain, problems with intercourse or other gynecologic concerns.     Social Relationship: married  Living: spouse  Work: Scientist, [employer] Exercise: 2-3 x wk Smoke/Alcohol/drug use: denies use   Gynecologic History No LMP recorded. (Menstrual status: Oral contraceptives). Contraception: none has been on OCP switching to HRT Last Pap: 03/24/2019. Results were: normal with negative HPV Last mammogram: 11/2021. Results were: normal  Obstetric History OB History  Gravida Para Term Preterm AB Living  2 2 2     2   SAB IAB Ectopic Multiple Live Births          2    # Outcome Date GA Lbr Len/2nd Weight Sex Delivery Anes PTL Lv  2 Term 10/10/98 [redacted]w[redacted]d  8 lb (3.629 kg) F Vag-Spont EPI N LIV  1 Term 04/08/96 [redacted]w[redacted]d  6 lb 12 oz (3.062 kg) M Vag-Spont Other N LIV    Past Medical History:  Diagnosis Date   Allergy    Arthritis    POLYARTHRALGIA   GERD (gastroesophageal reflux disease)    occ   History of kidney stones    currently as of 12-25-20   Leg swelling    Obesity    PMDD (premenstrual dysphoric disorder)    PMDD (premenstrual dysphoric disorder)    Raynaud disease    Vitamin D deficiency     Past Surgical History:  Procedure Laterality Date   COLONOSCOPY WITH PROPOFOL N/A 04/27/2019   Procedure: COLONOSCOPY WITH BIOPSY;  Surgeon: Midge Minium, MD;  Location: [facility];  Service: Endoscopy;  Laterality: N/A;   ESOPHAGOGASTRODUODENOSCOPY (EGD) WITH PROPOFOL N/A 04/27/2019   Procedure: ESOPHAGOGASTRODUODENOSCOPY (EGD) WITH PROPOFOL;  Surgeon: Midge Minium, MD;  Location: [facility];  Service: Endoscopy;  Laterality: N/A;   KNEE ARTHROPLASTY Right 01/03/2021    Procedure: COMPUTER ASSISTED TOTAL KNEE ARTHROPLASTY - RNFA;  Surgeon: Donato Heinz, MD;  Location: [facility];  Service: Orthopedics;  Laterality: Right;   KNEE ARTHROPLASTY Left 11/24/2021   Procedure: COMPUTER ASSISTED TOTAL KNEE ARTHROPLASTY;  Surgeon: Donato Heinz, MD;  Location: [facility];  Service: Orthopedics;  Laterality: Left;   POLYPECTOMY N/A 04/27/2019   Procedure: POLYPECTOMY;  Surgeon: Midge Minium, MD;  Location: [facility];  Service: Endoscopy;  Laterality: N/A;   ROBOTIC ASSISTED LAPAROSCOPIC CHOLECYSTECTOMY N/A 01/10/2019   Procedure: ROBOTIC ASSISTED LAPAROSCOPIC CHOLECYSTECTOMY;  Surgeon: Leafy Ro, MD;  Location: [facility];  Service: General;  Laterality: N/A;   TONSILLECTOMY      Current Outpatient Medications on File Prior to Visit  Medication Sig Dispense Refill   acetaminophen (TYLENOL) 500 MG tablet Take 500-1,000 mg by mouth every 6 (six) hours as needed for moderate pain.     benzonatate (TESSALON) 100 MG capsule Take 1 capsule (100 mg total) by mouth 3 (three) times daily as needed for cough. 21 capsule 0   cholecalciferol (VITAMIN D3) 25 MCG (1000 UNIT) tablet Take 1,000 Units by mouth daily.     diazepam (VALIUM) 2 MG tablet Take 1 tablet (2 mg total) by mouth every 8 (eight) hours as needed for muscle spasms. 15 tablet 0   diclofenac Sodium (VOLTAREN) 1 % GEL Apply 2 g topically 4 (four)  times daily.     ibuprofen (ADVIL) 800 MG tablet Take 1 tablet (800 mg total) by mouth every 8 (eight) hours as needed for moderate pain. 15 tablet 0   LESSINA-28 0.1-20 MG-MCG tablet TAKE 1 TABLET BY MOUTH EVERY DAY CONTINUOUSLY 84 tablet 3   meloxicam (MOBIC) 7.5 MG tablet Take 7.5 mg by mouth 2 (two) times daily.     omeprazole (PRILOSEC OTC) 20 MG tablet Take 20 mg by mouth daily as needed (heartburn).     ondansetron (ZOFRAN-ODT) 4 MG disintegrating tablet Take 1 tablet (4 mg total) by mouth every 8 (eight) hours as needed for nausea or vomiting. 15 tablet 0    oxyCODONE (OXY IR/ROXICODONE) 5 MG immediate release tablet Take 1 tablet (5 mg total) by mouth every 4 (four) hours as needed for severe pain. 30 tablet 0   oxyCODONE-acetaminophen (PERCOCET/ROXICET) 5-325 MG tablet Take 1 tablet by mouth every 4 (four) hours as needed for severe pain. 15 tablet 0   predniSONE (STERAPRED UNI-PAK 21 TAB) 10 MG (21) TBPK tablet Take by mouth daily. As directed 21 tablet 0   traMADol (ULTRAM) 50 MG tablet Take 1 tablet (50 mg total) by mouth every 4 (four) hours as needed for moderate pain. 30 tablet 0   enoxaparin (LOVENOX) 40 MG/0.4ML injection Inject 0.4 mLs (40 mg total) into the skin daily for 14 days. 5.6 mL 0   No current facility-administered medications on file prior to visit.    Allergies  Allergen Reactions   Penicillins Hives    IgE = 16 (WNL) on 12/25/2020  Did it involve swelling of the face/tongue/throat, SOB, or low BP? No Did it involve sudden or severe rash/hives, skin peeling, or any reaction on the inside of your mouth or nose? No Did you need to seek medical attention at a hospital or doctor's office? No When did it last happen?     within the last 5 years  If all above answers are "NO", may proceed with cephalosporin use.     Social History:  reports that she quit smoking about 19 years ago. Her smoking use included cigarettes. She has a 5.00 pack-year smoking history. She has never used smokeless tobacco. She reports that she does not drink alcohol and does not use drugs.  Family History  Problem Relation Age of Onset   Arthritis Mother    Raynaud syndrome Mother    Arthritis Father    Breast cancer Neg Hx    Bladder Cancer Neg Hx    Kidney cancer Neg Hx     The following portions of the patient's history were reviewed and updated as appropriate: allergies, current medications, past family history, past medical history, past social history, past surgical history and problem list.  Review of Systems Pertinent items noted in HPI  and remainder of comprehensive ROS otherwise negative.  Physical Exam:  BP (!) 143/84   Pulse 88   Ht 5\' 1"  (1.549 m)   Wt 215 lb 8 oz (97.8 kg)   BMI 40.72 kg/m  CONSTITUTIONAL: Well-developed, well-nourished female in no acute distress.  HENT:  Normocephalic, atraumatic, External right and left ear normal. Oropharynx is clear and moist EYES: Conjunctivae and EOM are normal. Pupils are equal, round, and reactive to light. No scleral icterus.  NECK: Normal range of motion, supple, no masses.  Normal thyroid.  SKIN: Skin is warm and dry. No rash noted. Not diaphoretic. No erythema. No pallor. MUSCULOSKELETAL: Normal range of motion. No tenderness.  No  cyanosis, clubbing, or edema.  2+ distal pulses. NEUROLOGIC: Alert and oriented to person, place, and time. Normal reflexes, muscle tone coordination.  PSYCHIATRIC: Normal mood and affect. Normal behavior. Normal judgment and thought content. CARDIOVASCULAR: Normal heart rate noted, regular rhythm RESPIRATORY: Clear to auscultation bilaterally. Effort and breath sounds normal, no problems with respiration noted. BREASTS: Symmetric in size. No masses, tenderness, skin changes, nipple drainage, or lymphadenopathy bilaterally.  ABDOMEN: Soft, no distention noted.  No tenderness, rebound or guarding.  PELVIC: Normal appearing external genitalia and urethral meatus; normal appearing vaginal mucosa and cervix.  No abnormal discharge noted.  Pap smear not due.  Normal uterine size, no other palpable masses, no uterine or adnexal tenderness.  .   Assessment and Plan:    1. Women's annual routine gynecological examination   Pap: not due  Mammogram : ordered  Labs: CBC , CMP, Vit D, Vita b6, Tsh, Lipid,  Refills: HRT- prempro Referral: none  Routine preventative health maintenance measures emphasized. Please refer to After Visit Summary for other counseling recommendations.      Doreene Burke, CNM [facility]  [facility],   [facility]

## 2022-11-04 NOTE — Patient Instructions (Signed)
Preventive Care 40-54 Years Old, Female Preventive care refers to lifestyle choices and visits with your health care provider that can promote health and wellness. Preventive care visits are also called wellness exams. What can I expect for my preventive care visit? Counseling Your health care provider may ask you questions about your: Medical history, including: Past medical problems. Family medical history. Pregnancy history. Current health, including: Menstrual cycle. Method of birth control. Emotional well-being. Home life and relationship well-being. Sexual activity and sexual health. Lifestyle, including: Alcohol, nicotine or tobacco, and drug use. Access to firearms. Diet, exercise, and sleep habits. Work and work environment. Sunscreen use. Safety issues such as seatbelt and bike helmet use. Physical exam Your health care provider will check your: Height and weight. These may be used to calculate your BMI (body mass index). BMI is a measurement that tells if you are at a healthy weight. Waist circumference. This measures the distance around your waistline. This measurement also tells if you are at a healthy weight and may help predict your risk of certain diseases, such as type 2 diabetes and high blood pressure. Heart rate and blood pressure. Body temperature. Skin for abnormal spots. What immunizations do I need?  Vaccines are usually given at various ages, according to a schedule. Your health care provider will recommend vaccines for you based on your age, medical history, and lifestyle or other factors, such as travel or where you work. What tests do I need? Screening Your health care provider may recommend screening tests for certain conditions. This may include: Lipid and cholesterol levels. Diabetes screening. This is done by checking your blood sugar (glucose) after you have not eaten for a while (fasting). Pelvic exam and Pap test. Hepatitis B test. Hepatitis C  test. HIV (human immunodeficiency virus) test. STI (sexually transmitted infection) testing, if you are at risk. Lung cancer screening. Colorectal cancer screening. Mammogram. Talk with your health care provider about when you should start having regular mammograms. This may depend on whether you have a family history of breast cancer. BRCA-related cancer screening. This may be done if you have a family history of breast, ovarian, tubal, or peritoneal cancers. Bone density scan. This is done to screen for osteoporosis. Talk with your health care provider about your test results, treatment options, and if necessary, the need for more tests. Follow these instructions at home: Eating and drinking  Eat a diet that includes fresh fruits and vegetables, whole grains, lean protein, and low-fat dairy products. Take vitamin and mineral supplements as recommended by your health care provider. Do not drink alcohol if: Your health care provider tells you not to drink. You are pregnant, may be pregnant, or are planning to become pregnant. If you drink alcohol: Limit how much you have to 0-1 drink a day. Know how much alcohol is in your drink. In the [place] one drink equals one 12 oz bottle of beer (355 mL), one 5 oz glass of wine (148 mL), or one 1 oz glass of hard liquor (44 mL). Lifestyle Brush your teeth every morning and night with fluoride toothpaste. Floss one time each day. Exercise for at least 30 minutes 5 or more days each week. Do not use any products that contain nicotine or tobacco. These products include cigarettes, chewing tobacco, and vaping devices, such as e-cigarettes. If you need help quitting, ask your health care provider. Do not use drugs. If you are sexually active, practice safe sex. Use a condom or other form of protection to   prevent STIs. If you do not wish to become pregnant, use a form of birth control. If you plan to become pregnant, see your health care provider for a  prepregnancy visit. Take aspirin only as told by your health care provider. Make sure that you understand how much to take and what form to take. Work with your health care provider to find out whether it is safe and beneficial for you to take aspirin daily. Find healthy ways to manage stress, such as: Meditation, yoga, or listening to music. Journaling. Talking to a trusted person. Spending time with friends and family. Minimize exposure to UV radiation to reduce your risk of skin cancer. Safety Always wear your seat belt while driving or riding in a vehicle. Do not drive: If you have been drinking alcohol. Do not ride with someone who has been drinking. When you are tired or distracted. While texting. If you have been using any mind-altering substances or drugs. Wear a helmet and other protective equipment during sports activities. If you have firearms in your house, make sure you follow all gun safety procedures. Seek help if you have been physically or sexually abused. What's next? Visit your health care provider once a year for an annual wellness visit. Ask your health care provider how often you should have your eyes and teeth checked. Stay up to date on all vaccines. This information is not intended to replace advice given to you by your health care provider. Make sure you discuss any questions you have with your health care provider. Document Revised: 12/18/2020 Document Reviewed: 12/18/2020 Elsevier Patient Education  2023 [employer]  

## 2022-11-09 DIAGNOSIS — F413 Other mixed anxiety disorders: Secondary | ICD-10-CM | POA: Diagnosis not present

## 2022-11-11 ENCOUNTER — Encounter: Payer: Self-pay | Admitting: Certified Nurse Midwife

## 2022-11-11 LAB — COMPREHENSIVE METABOLIC PANEL
ALT: 13 IU/L (ref 0–32)
AST: 17 IU/L (ref 0–40)
Albumin/Globulin Ratio: 1.5 (ref 1.2–2.2)
Albumin: 4 g/dL (ref 3.8–4.9)
Alkaline Phosphatase: 144 IU/L — ABNORMAL HIGH (ref 44–121)
BUN/Creatinine Ratio: 19 (ref 9–23)
BUN: 14 mg/dL (ref 6–24)
Bilirubin Total: 0.2 mg/dL (ref 0.0–1.2)
CO2: 20 mmol/L (ref 20–29)
Calcium: 9.3 mg/dL (ref 8.7–10.2)
Chloride: 102 mmol/L (ref 96–106)
Creatinine, Ser: 0.75 mg/dL (ref 0.57–1.00)
Globulin, Total: 2.6 g/dL (ref 1.5–4.5)
Glucose: 114 mg/dL — ABNORMAL HIGH (ref 70–99)
Potassium: 4.5 mmol/L (ref 3.5–5.2)
Sodium: 139 mmol/L (ref 134–144)
Total Protein: 6.6 g/dL (ref 6.0–8.5)
eGFR: 95 mL/min/{1.73_m2} (ref >59)

## 2022-11-11 LAB — CBC
Hematocrit: 44.7 % (ref 34.0–46.6)
Hemoglobin: 13.6 g/dL (ref 11.1–15.9)
MCH: 23.3 pg — ABNORMAL LOW (ref 26.6–33.0)
MCHC: 30.4 g/dL — ABNORMAL LOW (ref 31.5–35.7)
MCV: 77 fL — ABNORMAL LOW (ref 79–97)
Platelets: 386 10*3/uL (ref 150–450)
RBC: 5.84 x10E6/uL — ABNORMAL HIGH (ref 3.77–5.28)
RDW: 14.4 % (ref 11.7–15.4)
WBC: 11.1 10*3/uL — ABNORMAL HIGH (ref 3.4–10.8)

## 2022-11-11 LAB — TSH+FREE T4
Free T4: 1.12 ng/dL (ref 0.82–1.77)
TSH: 0.987 u[IU]/mL (ref 0.450–4.500)

## 2022-11-11 LAB — HEMOGLOBIN A1C
Est. average glucose Bld gHb Est-mCnc: 143 mg/dL
Hgb A1c MFr Bld: 6.6 % — ABNORMAL HIGH (ref 4.8–5.6)

## 2022-11-11 LAB — VITAMIN B6: Vitamin B6: 5 ug/L (ref 3.4–65.2)

## 2022-11-11 LAB — IRON: Iron: 31 ug/dL (ref 27–159)

## 2022-11-11 LAB — VITAMIN D 25 HYDROXY (VIT D DEFICIENCY, FRACTURES): Vit D, 25-Hydroxy: 26.6 ng/mL — ABNORMAL LOW (ref 30.0–100.0)

## 2022-11-12 DIAGNOSIS — Z96653 Presence of artificial knee joint, bilateral: Secondary | ICD-10-CM | POA: Diagnosis not present

## 2022-11-12 DIAGNOSIS — E119 Type 2 diabetes mellitus without complications: Secondary | ICD-10-CM | POA: Diagnosis not present

## 2022-11-14 ENCOUNTER — Encounter: Payer: Self-pay | Admitting: Certified Nurse Midwife

## 2022-11-25 ENCOUNTER — Ambulatory Visit: Payer: Federal, State, Local not specified - PPO | Admitting: Podiatry

## 2022-12-02 ENCOUNTER — Ambulatory Visit: Payer: Federal, State, Local not specified - PPO | Admitting: Family

## 2022-12-02 ENCOUNTER — Ambulatory Visit: Payer: Federal, State, Local not specified - PPO | Admitting: Podiatry

## 2022-12-02 ENCOUNTER — Encounter: Payer: Self-pay | Admitting: Family

## 2022-12-02 ENCOUNTER — Encounter: Payer: Self-pay | Admitting: Podiatry

## 2022-12-02 ENCOUNTER — Ambulatory Visit (INDEPENDENT_AMBULATORY_CARE_PROVIDER_SITE_OTHER): Payer: Federal, State, Local not specified - PPO

## 2022-12-02 VITALS — BP 128/82 | HR 100 | Temp 97.5°F | Ht 61.0 in | Wt 217.6 lb

## 2022-12-02 DIAGNOSIS — M76821 Posterior tibial tendinitis, right leg: Secondary | ICD-10-CM | POA: Diagnosis not present

## 2022-12-02 DIAGNOSIS — M778 Other enthesopathies, not elsewhere classified: Secondary | ICD-10-CM | POA: Diagnosis not present

## 2022-12-02 DIAGNOSIS — E119 Type 2 diabetes mellitus without complications: Secondary | ICD-10-CM

## 2022-12-02 DIAGNOSIS — Z87898 Personal history of other specified conditions: Secondary | ICD-10-CM

## 2022-12-02 DIAGNOSIS — Z8639 Personal history of other endocrine, nutritional and metabolic disease: Secondary | ICD-10-CM | POA: Diagnosis not present

## 2022-12-02 DIAGNOSIS — D3502 Benign neoplasm of left adrenal gland: Secondary | ICD-10-CM | POA: Diagnosis not present

## 2022-12-02 DIAGNOSIS — R899 Unspecified abnormal finding in specimens from other organs, systems and tissues: Secondary | ICD-10-CM | POA: Diagnosis not present

## 2022-12-02 NOTE — Patient Instructions (Addendum)
Start women's daily which includes iron.   I placed a referral to endocrinology for evaluation of left adrenal adenoma in regards to surveillance, hormone evaluation  Let us know if you dont hear back within a week in regards to an appointment being scheduled.   So that you are aware, if you are [facility] MyChart user , please pay attention to your MyChart messages as you may receive a MyChart message with a phone number to call and schedule this test/appointment own your own from our referral coordinator. This is a new process so I do not want you to miss this message.  If you are not a MyChart user, you will receive a phone call.    This is  Dr. Melina Schools  example of a  "Low GI"  Diet:  It will allow you to lose 4 to 8  lbs  per month if you follow it carefully.  Your goal with exercise is a minimum of 30 minutes of aerobic exercise 5 days per week (Walking does not count once it becomes easy!)    All of the foods can be found at grocery stores and in bulk at [employer].  The Atkins protein bars and shakes are available in more varieties at Target, WalMart and [employer]'s Foods.     7 AM Breakfast:  Choose from the following:  Low carbohydrate Protein  Shakes (I recommend the  Premier Protein chocolate shakes,  EAS AdvantEdge "Carb Control" shakes  Or the Atkins shakes all are under 3 net carbs)     a scrambled egg/bacon/cheese burrito made with Mission's "carb balance" whole wheat tortilla  (about 10 net carbs )  Medical laboratory scientific officer (basically a quiche without the pastry crust) that is eaten cold and very convenient way to get your eggs.  8 carbs)  If you make your own protein shakes, avoid bananas and pineapple,  And use low carb greek yogurt or original /unsweetened almond or soy milk    Avoid cereal and bananas, oatmeal and cream of wheat and grits. They are loaded with carbohydrates!   10 AM: high protein snack:  Protein bar by Atkins (the snack size, under 200 cal, usually < 6  net carbs).    A stick of cheese:  Around 1 carb,  100 cal     Dannon Light n Fit [place] Yogurt  (80 cal, 8 carbs)  Other so called "protein bars" and Greek yogurts tend to be loaded with carbohydrates.  Remember, in food advertising, the word "energy" is synonymous for " carbohydrate."  Lunch:   A Sandwich using the bread choices listed, Can use any  Eggs,  lunchmeat, grilled meat or canned tuna), avocado, regular mayo/mustard  and cheese.  A Salad using blue cheese, ranch,  Goddess or vinagrette,  Avoid taco shells, croutons or "confetti" and no "candied nuts" but regular nuts OK.   No pretzels, nabs  or chips.  Pickles and miniature sweet peppers are a good low carb alternative that provide a "crunch"  The bread is the only source of carbohydrate in a sandwich and  can be decreased by trying some of the attached alternatives to traditional loaf bread   Avoid "Low fat dressings, as well as Reyne Dumas and [employer] dressings They are loaded with sugar!   3 PM/ Mid day  Snack:  Consider  1 ounce of  almonds, walnuts, pistachios, pecans, peanuts,  Macadamia nuts or a nut medley.  Avoid "granola and granola bars "  Mixed nuts are ok in moderation as long as there are no raisins,  cranberries or dried fruit.   KIND bars are OK if you get the low glycemic index variety   Try the prosciutto/mozzarella cheese sticks by Fiorruci  In deli /backery section   High protein      6 PM  Dinner:     Meat/fowl/fish with a green salad, and either broccoli, cauliflower, green beans, spinach, brussel sprouts or  Lima beans. DO NOT BREAD THE PROTEIN!!      There is a low carb pasta by Dreamfield's that is acceptable and tastes great: only 5 digestible carbs/serving.( All grocery stores but BJs carry it ) Several ready made meals are available low carb:   Try Michel Angelo's chicken piccata or chicken or eggplant parm over low carb pasta.(Lowes and BJs)   Clifton Custard Sanchez's "Carnitas" (pulled pork, no sauce,   0 carbs) or his beef pot roast to make a dinner burrito (at BJ's)  Pesto over low carb pasta (bj's sells a good quality pesto in the center refrigerated section of the deli   Try satueeing  Roosvelt Harps with mushroooms as a good side   Green Giant makes a mashed cauliflower that tastes like mashed potatoes  Whole wheat pasta is still full of digestible carbs and  Not as low in glycemic index as Dreamfield's.   Brown rice is still rice,  So skip the rice and noodles if you eat [place] or [place] (or at least limit to 1/2 cup)  9 PM snack :   Breyer's "low carb" fudgsicle or  ice cream bar (Carb Smart line), or  Weight Watcher's ice cream bar , or another "no sugar added" ice cream;  a serving of fresh berries/cherries with whipped cream   Cheese or DANNON'S LlGHT N FIT GREEK YOGURT  8 ounces of [place] Diamond unsweetened almond/cococunut milk    Treat yourself to a parfait made with whipped cream blueberiies, walnuts and vanilla greek yogurt  Avoid bananas, pineapple, grapes  and watermelon on a regular basis because they are high in sugar.  THINK OF THEM AS DESSERT  Remember that snack Substitutions should be less than 10 NET carbs per serving and meals < 20 carbs. Remember to subtract fiber grams to get the "net carbs."

## 2022-12-02 NOTE — Assessment & Plan Note (Signed)
Incidental Stable 1.2 cm left adrenal adenoma seen ct renal 03/24/22 Referral to endocrine for surveillance, hormone evaluation.

## 2022-12-02 NOTE — Progress Notes (Signed)
Assessment & Plan:  Adrenal adenoma, left Assessment & Plan: Incidental Stable 1.2 cm left adrenal adenoma seen ct renal 03/24/22 Referral to endocrine for surveillance, hormone evaluation.   Orders: -     Ambulatory referral to Endocrinology  Abnormal laboratory test -     Gamma GT; Future -     Alkaline phosphatase, isoenzymes; Future -     CBC with Differential/Platelet; Future  History of prediabetes Assessment & Plan: Lab Results  Component Value Date   HGBA1C 6.6 (H) 11/04/2022   Patient politely declines medication at this time.  We discussed metformin.  She would like to work on dietary changes.  Counseled on low glycemic diet.  Follow-up in 3 months time  Orders: -     Lipid panel; Future -     Microalbumin / creatinine urine ratio; Future  History of vitamin D deficiency -     VITAMIN D 25 Hydroxy (Vit-D Deficiency, Fractures); Future  Type 2 diabetes mellitus without complication, without long-term current use of insulin (HCC) Assessment & Plan: Lab Results  Component Value Date   HGBA1C 6.6 (H) 11/04/2022   Patient politely declines medication at this time.  We discussed metformin.  She would like to work on dietary changes.  Counseled on low glycemic diet.  Follow-up in 3 months time      Return precautions given.   Risks, benefits, and alternatives of the medications and treatment plan prescribed today were discussed, and patient expressed understanding.   Education regarding symptom management and diagnosis given to patient on AVS either electronically or printed.  Return in about 3 months (around 03/04/2023) for Fasting labs in 2-3 weeks.  [name], FNP  Subjective:    Patient ID: Sierra Moore, female    DOB: 1971-06-14, 52 y.o.   MRN: [id number]  CC: Sierra Moore is a 52 y.o. female who presents today for follow up.   HPI: Follow-up today to discuss labs obtained this month, CT renal incidental findings   She has not been on  medication to treat diabetes in the past   Mammogram is scheduled. She follows with GYN  Blood work  11/04/22  Seen in ED 03/24/22 for right flank pain.  Pain completely resolved after 1 dose of Valium and has not recurred.  CT renal 03/24/22 significant for left adrenal adneoma.   Rare tylenol use for pain.  No alcohol use  Elevation alkaline phosphatase   Allergies: Penicillins Current Outpatient Medications on File Prior to Visit  Medication Sig Dispense Refill   acetaminophen (TYLENOL) 500 MG tablet Take 500-1,000 mg by mouth every 6 (six) hours as needed for moderate pain.     cholecalciferol (VITAMIN D3) 25 MCG (1000 UNIT) tablet Take 2,000 Units by mouth daily.     estrogen, conjugated,-medroxyprogesterone (PREMPRO) 0.3-1.5 MG tablet Take 1 tablet by mouth daily. 30 tablet 5   No current facility-administered medications on file prior to visit.    Review of Systems  Constitutional:  Negative for chills and fever.  Respiratory:  Negative for cough.   Cardiovascular:  Negative for chest pain and palpitations.  Gastrointestinal:  Negative for nausea and vomiting.  Genitourinary:  Negative for flank pain.  Musculoskeletal:  Negative for back pain.      Objective:    BP 128/82   Pulse 100   Temp (!) 97.5 F (36.4 C)   Ht 5\' 1"  (1.549 m)   Wt 217 lb 9.6 oz (98.7 kg)   SpO2 98%  BMI 41.12 kg/m  BP Readings from Last 3 Encounters:  12/02/22 128/82  11/04/22 (!) 143/84  06/24/22 139/86   Wt Readings from Last 3 Encounters:  12/02/22 217 lb 9.6 oz (98.7 kg)  11/04/22 215 lb 8 oz (97.8 kg)  06/24/22 200 lb (90.7 kg)    Physical Exam Vitals reviewed.  Constitutional:      Appearance: She is well-developed.  Eyes:     Conjunctiva/sclera: Conjunctivae normal.  Neck:     Thyroid: No thyroid mass, thyromegaly or thyroid tenderness.  Cardiovascular:     Rate and Rhythm: Normal rate and regular rhythm.     Pulses: Normal pulses.     Heart sounds: Normal heart  sounds.  Pulmonary:     Effort: Pulmonary effort is normal.     Breath sounds: Normal breath sounds. No wheezing, rhonchi or rales.  Skin:    General: Skin is warm and dry.  Neurological:     Mental Status: She is alert.  Psychiatric:        Speech: Speech normal.        Behavior: Behavior normal.        Thought Content: Thought content normal.

## 2022-12-02 NOTE — Progress Notes (Signed)
She presents today for follow-up of her posterior tibial tendinitis of her right foot.  States that is still bothersome also feels like the orthotic is digging into the side of the foot.  She is wondering whether we need to do surgery or whether it is something that can be fixed with correction of the orthotic.  Objective: Vital signs are stable alert and oriented x 3.  Pulses are palpable.  Fashion the orthotic with a medial navicular flange where the area was still moderately tender beneath the navicular tuberosity.  Otherwise the foot appears to be doing well.  Mild tenderness on palpation and end range of motion of the first metatarsophalangeal joint.  Radiographs taken today demonstrate osseously mature individual no significant osseous abnormalities other than some joint space narrowing and some spurring at the plantar proximal aspect of the proximal phalanx.2  Assessment: Posterior tibial tendinitis right.  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  Plan: Discussed etiology pathology and surgical therapies at this point I fashioned navicular tuberosity pad see if that would help push-up on the navicular at all to alleviate her symptoms if this fails to alleviate her symptoms may need to consider surgical intervention.

## 2022-12-02 NOTE — Assessment & Plan Note (Signed)
Lab Results  Component Value Date   HGBA1C 6.6 (H) 11/04/2022   Patient politely declines medication at this time.  We discussed metformin.  She would like to work on dietary changes.  Counseled on low glycemic diet.  Follow-up in 3 months time

## 2022-12-14 DIAGNOSIS — F413 Other mixed anxiety disorders: Secondary | ICD-10-CM | POA: Diagnosis not present

## 2022-12-16 ENCOUNTER — Encounter: Payer: Self-pay | Admitting: Family

## 2022-12-22 ENCOUNTER — Ambulatory Visit
Admission: RE | Admit: 2022-12-22 | Discharge: 2022-12-22 | Disposition: A | Discharge status: Home / Self Care | Payer: Federal, State, Local not specified - PPO | Source: Ambulatory Visit | Attending: Certified Nurse Midwife | Admitting: Certified Nurse Midwife

## 2022-12-22 ENCOUNTER — Encounter: Payer: Self-pay | Admitting: Radiology

## 2022-12-22 DIAGNOSIS — Z01419 Encounter for gynecological examination (general) (routine) without abnormal findings: Secondary | ICD-10-CM

## 2022-12-22 DIAGNOSIS — Z1231 Encounter for screening mammogram for malignant neoplasm of breast: Secondary | ICD-10-CM | POA: Insufficient documentation

## 2022-12-22 NOTE — Telephone Encounter (Signed)
noted 

## 2022-12-23 ENCOUNTER — Other Ambulatory Visit (INDEPENDENT_AMBULATORY_CARE_PROVIDER_SITE_OTHER): Payer: Federal, State, Local not specified - PPO

## 2022-12-23 DIAGNOSIS — Z87898 Personal history of other specified conditions: Secondary | ICD-10-CM | POA: Diagnosis not present

## 2022-12-23 DIAGNOSIS — Z8639 Personal history of other endocrine, nutritional and metabolic disease: Secondary | ICD-10-CM | POA: Diagnosis not present

## 2022-12-23 DIAGNOSIS — R899 Unspecified abnormal finding in specimens from other organs, systems and tissues: Secondary | ICD-10-CM | POA: Diagnosis not present

## 2022-12-23 LAB — MICROALBUMIN / CREATININE URINE RATIO
Creatinine,U: 157.3 mg/dL
Microalb Creat Ratio: 0.7 mg/g (ref 0.0–30.0)
Microalb, Ur: 1 mg/dL (ref 0.0–1.9)

## 2022-12-23 LAB — LIPID PANEL
Cholesterol: 175 mg/dL (ref 0–200)
HDL: 40.7 mg/dL (ref >39.00)
LDL Cholesterol: 112 mg/dL — ABNORMAL HIGH (ref 0–99)
NonHDL: 134.44
Total CHOL/HDL Ratio: 4
Triglycerides: 111 mg/dL (ref 0.0–149.0)
VLDL: 22.2 mg/dL (ref 0.0–40.0)

## 2022-12-23 LAB — CBC WITH DIFFERENTIAL/PLATELET
Basophils Absolute: 0.1 10*3/uL (ref 0.0–0.1)
Basophils Relative: 0.5 % (ref 0.0–3.0)
Eosinophils Absolute: 0.3 10*3/uL (ref 0.0–0.7)
Eosinophils Relative: 2 % (ref 0.0–5.0)
HCT: 42.3 % (ref 36.0–46.0)
Hemoglobin: 13.1 g/dL (ref 12.0–15.0)
Lymphocytes Relative: 14.1 % (ref 12.0–46.0)
Lymphs Abs: 1.8 10*3/uL (ref 0.7–4.0)
MCHC: 30.9 g/dL (ref 30.0–36.0)
MCV: 75.8 fl — ABNORMAL LOW (ref 78.0–100.0)
Monocytes Absolute: 0.9 10*3/uL (ref 0.1–1.0)
Monocytes Relative: 6.9 % (ref 3.0–12.0)
Neutro Abs: 9.8 10*3/uL — ABNORMAL HIGH (ref 1.4–7.7)
Neutrophils Relative %: 76.5 % (ref 43.0–77.0)
Platelets: 404 10*3/uL — ABNORMAL HIGH (ref 150.0–400.0)
RBC: 5.58 Mil/uL — ABNORMAL HIGH (ref 3.87–5.11)
RDW: 16.7 % — ABNORMAL HIGH (ref 11.5–15.5)
WBC: 12.8 10*3/uL — ABNORMAL HIGH (ref 4.0–10.5)

## 2022-12-23 LAB — GAMMA GT: GGT: 24 U/L (ref 7–51)

## 2022-12-23 LAB — VITAMIN D 25 HYDROXY (VIT D DEFICIENCY, FRACTURES): VITD: 23.14 ng/mL — ABNORMAL LOW (ref 30.00–100.00)

## 2022-12-24 LAB — ALKALINE PHOSPHATASE, ISOENZYMES: Alkaline Phosphatase: 144 IU/L — ABNORMAL HIGH (ref 44–121)

## 2022-12-30 DIAGNOSIS — D3502 Benign neoplasm of left adrenal gland: Secondary | ICD-10-CM | POA: Diagnosis not present

## 2022-12-31 DIAGNOSIS — D3502 Benign neoplasm of left adrenal gland: Secondary | ICD-10-CM | POA: Diagnosis not present

## 2023-01-04 DIAGNOSIS — F413 Other mixed anxiety disorders: Secondary | ICD-10-CM | POA: Diagnosis not present

## 2023-01-13 ENCOUNTER — Telehealth: Payer: Self-pay | Admitting: Family

## 2023-01-13 NOTE — Telephone Encounter (Signed)
Spoke to [facility] and she stated that she would pass info to Dr Standley Brooking but she is only in the office a couple  days during the week she also stated that she was out of office today, she will be on vacation for next week and 1/2  but message below was given to her   Pt alkaline phosphatase is elevated   When fractionated out, 50 percent bone, 50 percent liver.    GGT is normal   Would she advise evaluation for bone disease such as paget's disease ?     I had planned to order DEXA scan.    Would she recommend pelvic or lumbar plain film?    Please give her my cell phone as happy to speak by phone    [phone]

## 2023-01-13 NOTE — Telephone Encounter (Signed)
[facility] and spoke to receptionist, Dr Standley Brooking nurse was busy but LVM and number to call back

## 2023-01-13 NOTE — Telephone Encounter (Signed)
Shanda Bumps from [facility] called, [phone]. Jenate please call her

## 2023-01-13 NOTE — Telephone Encounter (Signed)
Call [facility]  Ask to speak with Dr Krystal Eaton nurse for advice  Pt alkaline phosphatase is elevated  When fractionated out, 50 percent bone, 50 percent liver.   GGT is normal  Would she advise evaluation for bone disease such as paget's disease ?    I had planned to order DEXA scan.   Would she recommend pelvic or lumbar plain film?   Please give her my cell phone as happy to speak by phone   [phone]

## 2023-01-14 ENCOUNTER — Encounter: Payer: Self-pay | Admitting: Family

## 2023-01-18 DIAGNOSIS — F413 Other mixed anxiety disorders: Secondary | ICD-10-CM | POA: Diagnosis not present

## 2023-01-20 ENCOUNTER — Encounter: Payer: Self-pay | Admitting: Family

## 2023-01-20 ENCOUNTER — Other Ambulatory Visit: Payer: Self-pay | Admitting: Family

## 2023-01-20 DIAGNOSIS — R748 Abnormal levels of other serum enzymes: Secondary | ICD-10-CM

## 2023-01-20 DIAGNOSIS — Z8639 Personal history of other endocrine, nutritional and metabolic disease: Secondary | ICD-10-CM

## 2023-01-20 NOTE — Telephone Encounter (Signed)
See result note and documentation of Dr Krystal Eaton phonecall

## 2023-01-20 NOTE — Progress Notes (Signed)
Dr Beatrix Fetters left me a detailed voicemail on my personal phone 01/13/23 regarding vit D def a/w elevation in alk phos. Advised to treat vitamin D deficiency ahead of xray images to eval for further bone disease.

## 2023-01-26 ENCOUNTER — Encounter: Payer: Self-pay | Admitting: Family

## 2023-01-27 NOTE — Telephone Encounter (Signed)
Pt returned [facility] CMA call. Note below was read to her. Pt aware and understood. Pt its scheduled to see Arnett on 02/02/23.

## 2023-01-27 NOTE — Telephone Encounter (Signed)
LVM to call back to schedule appt with Sierra Moore to discuss back issues and to get Xrays done as well

## 2023-01-27 NOTE — Telephone Encounter (Signed)
Noted  

## 2023-02-01 DIAGNOSIS — F413 Other mixed anxiety disorders: Secondary | ICD-10-CM | POA: Diagnosis not present

## 2023-02-02 ENCOUNTER — Encounter: Payer: Self-pay | Admitting: Family

## 2023-02-02 ENCOUNTER — Ambulatory Visit: Payer: Federal, State, Local not specified - PPO

## 2023-02-02 ENCOUNTER — Ambulatory Visit: Payer: Federal, State, Local not specified - PPO | Admitting: Family

## 2023-02-02 VITALS — BP 128/82 | HR 95 | Temp 97.8°F | Ht 61.0 in | Wt 218.8 lb

## 2023-02-02 DIAGNOSIS — G8929 Other chronic pain: Secondary | ICD-10-CM

## 2023-02-02 DIAGNOSIS — M545 Low back pain, unspecified: Secondary | ICD-10-CM

## 2023-02-02 DIAGNOSIS — M47817 Spondylosis without myelopathy or radiculopathy, lumbosacral region: Secondary | ICD-10-CM | POA: Diagnosis not present

## 2023-02-02 DIAGNOSIS — D3502 Benign neoplasm of left adrenal gland: Secondary | ICD-10-CM | POA: Diagnosis not present

## 2023-02-02 DIAGNOSIS — M25552 Pain in left hip: Secondary | ICD-10-CM | POA: Diagnosis not present

## 2023-02-02 DIAGNOSIS — M25551 Pain in right hip: Secondary | ICD-10-CM | POA: Diagnosis not present

## 2023-02-02 NOTE — Patient Instructions (Signed)
X-rays today  Consider muscle relaxant  Referral to physical therapy  Let us know if you dont hear back within a week in regards to an appointment being scheduled.   So that you are aware, if you are [facility] MyChart user , please pay attention to your MyChart messages as you may receive a MyChart message with a phone number to call and schedule this test/appointment own your own from our referral coordinator. This is a new process so I do not want you to miss this message.  If you are not a MyChart user, you will receive a phone call.    As discussed, let's start by scheduling Tylenol Arthritis which is a 650mg  tablet .   You may take 1-2 tablets every 8 hours ( scheduled) with maximum of 6 tablets per day. Most adults can safely take 4 pills total per day of Tylenol Arthritis 650mg  tablet. Do not exceed 6 tablets a day of Tylenol Arthritis 650mg  tablet   For example , you could take two tablets in the morning ( 8am) and then two tablets again at 4pm.   Maximum daily dose of acetaminophen 4 g per day from all sources.  If you are taking another medication which includes acetaminophen (Tylenol) which may be in cough and cold preparations or pain medication such as Percocet, you will need to factor that into your total daily dose to be safe.  Please let me know if any questions  A great article regarding how to safely take and dose tylenol found below.  Title : 'Acetaminophen safety: Be cautious but not afraid'  [email or web address].https://gentry.org/

## 2023-02-02 NOTE — Assessment & Plan Note (Addendum)
She is no following with endocrine Dr Beatrix Fetters, endocrine and advised by endocrine  no further evaluation necessary.

## 2023-02-02 NOTE — Assessment & Plan Note (Signed)
Question if pain is from SI joint dysfunction.  Pending lumbar and bilateral hip x-rays.  Referral to physical therapy.  Counseled patient how to optimize Tylenol arthritis by scheduling.  She will let me know how she is doing.

## 2023-02-02 NOTE — Progress Notes (Signed)
Assessment & Plan:  Chronic bilateral low back pain without sciatica Assessment & Plan: Question if pain is from SI joint dysfunction.  Pending lumbar and bilateral hip x-rays.  Referral to physical therapy.  Counseled patient how to optimize Tylenol arthritis by scheduling.  She will let me know how she is doing.   Orders: -     DG Lumbar Spine Complete; Future -     DG HIPS BILAT W OR W/O PELVIS 3-4 VIEWS; Future -     Ambulatory referral to Physical Therapy  Adrenal adenoma, left Assessment & Plan: She is no following with endocrine Dr Beatrix Fetters, endocrine and advised by endocrine  no further evaluation necessary.         Return precautions given.   Risks, benefits, and alternatives of the medications and treatment plan prescribed today were discussed, and patient expressed understanding.   Education regarding symptom management and diagnosis given to patient on AVS either electronically or printed.  No follow-ups on file.  Sierra Plowman, FNP  Subjective:    Patient ID: Sierra Moore, female    DOB: 26-Apr-1971, 52 y.o.   MRN: [id number]  CC: Sierra Moore is a 52 y.o. female who presents today for an acute visit.    HPI: She complains of acute on chronic low back pain, worse past couple of weeks.   Aggravated after she walks long distances or sits for long periods of time  She normally goes to chiropractor ; she is interested in PT.   Low back pain is episodic and she has flares.   She has occasional right hip pain, improved lately.  It is uncomfortable to sleep on her right hip.   No dysuria, urinary frequency , numbness, saddle anesthesia, fecal incontinence, urinary incontinence.  She takes mobic 7.5mg  BID prn on occasion. She takes ibuprofen 400mg  with some relief.   Rare use of tylenol 500mg  as not as effective.      History of right and left knee surgery   Allergies: Penicillins Current Outpatient Medications on File Prior to Visit   Medication Sig Dispense Refill   acetaminophen (TYLENOL) 500 MG tablet Take 500-1,000 mg by mouth every 6 (six) hours as needed for moderate pain.     cholecalciferol (VITAMIN D3) 25 MCG (1000 UNIT) tablet Take 2,000 Units by mouth daily.     estrogen, conjugated,-medroxyprogesterone (PREMPRO) 0.3-1.5 MG tablet Take 1 tablet by mouth daily. 30 tablet 5   No current facility-administered medications on file prior to visit.    Review of Systems  Constitutional:  Negative for chills and fever.  Respiratory:  Negative for cough.   Cardiovascular:  Negative for chest pain and palpitations.  Gastrointestinal:  Negative for nausea and vomiting.  Genitourinary:  Negative for difficulty urinating.  Musculoskeletal:  Positive for back pain.  Neurological:  Negative for numbness.      Objective:    BP 128/82   Pulse 95   Temp 97.8 F (36.6 C) (Oral)   Ht 5\' 1"  (1.549 m)   Wt 218 lb 12.8 oz (99.2 kg)   LMP  (LMP Unknown)   SpO2 98%   BMI 41.34 kg/m   BP Readings from Last 3 Encounters:  02/02/23 128/82  12/02/22 128/82  11/04/22 (!) 143/84   Wt Readings from Last 3 Encounters:  02/02/23 218 lb 12.8 oz (99.2 kg)  12/02/22 217 lb 9.6 oz (98.7 kg)  11/04/22 215 lb 8 oz (97.8 kg)    Physical Exam Vitals reviewed.  Constitutional:      Appearance: She is well-developed.  Eyes:     Conjunctiva/sclera: Conjunctivae normal.  Cardiovascular:     Rate and Rhythm: Normal rate and regular rhythm.     Pulses: Normal pulses.     Heart sounds: Normal heart sounds.  Pulmonary:     Effort: Pulmonary effort is normal.     Breath sounds: Normal breath sounds. No wheezing, rhonchi or rales.  Musculoskeletal:     Lumbar back: No swelling, edema, spasms, tenderness or bony tenderness. Normal range of motion. Negative right straight leg raise test and negative left straight leg raise test.       Back:     Comments: Full range of motion with flexion, tension, lateral side bends.  Slight  discomfort over bilateral SI joints  No pain, numbness, tingling elicited with single leg raise bilaterally.   No limp or waddling gait. Full ROM with flexion and hip rotation in flexion bilaterally.   No pain of lateral hip with  (flexion-abduction-external rotation) test bilaterally.   Slight discomfort with deep palpation of greater trochanter bilaterally.     Skin:    General: Skin is warm and dry.  Neurological:     Mental Status: She is alert.     Sensory: No sensory deficit.     Deep Tendon Reflexes:     Reflex Scores:      Patellar reflexes are 2+ on the right side and 2+ on the left side.    Comments: Sensation and strength intact bilateral lower extremities.  Psychiatric:        Speech: Speech normal.        Behavior: Behavior normal.        Thought Content: Thought content normal.

## 2023-02-04 MED ORDER — CHOLECALCIFEROL 1.25 MG (50000 UT) PO TABS: ORAL_TABLET | ORAL | 0 refills | Status: DC

## 2023-02-10 DIAGNOSIS — M6281 Muscle weakness (generalized): Secondary | ICD-10-CM | POA: Diagnosis not present

## 2023-02-15 DIAGNOSIS — F413 Other mixed anxiety disorders: Secondary | ICD-10-CM | POA: Diagnosis not present

## 2023-02-15 DIAGNOSIS — M25562 Pain in left knee: Secondary | ICD-10-CM | POA: Diagnosis not present

## 2023-02-15 DIAGNOSIS — M25662 Stiffness of left knee, not elsewhere classified: Secondary | ICD-10-CM | POA: Diagnosis not present

## 2023-02-15 DIAGNOSIS — M6281 Muscle weakness (generalized): Secondary | ICD-10-CM | POA: Diagnosis not present

## 2023-02-15 DIAGNOSIS — Z96652 Presence of left artificial knee joint: Secondary | ICD-10-CM | POA: Diagnosis not present

## 2023-02-24 DIAGNOSIS — M6281 Muscle weakness (generalized): Secondary | ICD-10-CM | POA: Diagnosis not present

## 2023-02-24 DIAGNOSIS — M25562 Pain in left knee: Secondary | ICD-10-CM | POA: Diagnosis not present

## 2023-02-24 DIAGNOSIS — M25662 Stiffness of left knee, not elsewhere classified: Secondary | ICD-10-CM | POA: Diagnosis not present

## 2023-02-24 DIAGNOSIS — Z96652 Presence of left artificial knee joint: Secondary | ICD-10-CM | POA: Diagnosis not present

## 2023-03-04 DIAGNOSIS — F413 Other mixed anxiety disorders: Secondary | ICD-10-CM | POA: Diagnosis not present

## 2023-03-05 ENCOUNTER — Ambulatory Visit: Payer: Federal, State, Local not specified - PPO | Admitting: Family

## 2023-03-05 DIAGNOSIS — M6281 Muscle weakness (generalized): Secondary | ICD-10-CM | POA: Diagnosis not present

## 2023-03-05 DIAGNOSIS — M25562 Pain in left knee: Secondary | ICD-10-CM | POA: Diagnosis not present

## 2023-03-05 DIAGNOSIS — M25662 Stiffness of left knee, not elsewhere classified: Secondary | ICD-10-CM | POA: Diagnosis not present

## 2023-03-05 DIAGNOSIS — Z96652 Presence of left artificial knee joint: Secondary | ICD-10-CM | POA: Diagnosis not present

## 2023-03-07 ENCOUNTER — Encounter: Payer: Self-pay | Admitting: Certified Nurse Midwife

## 2023-03-10 DIAGNOSIS — M9903 Segmental and somatic dysfunction of lumbar region: Secondary | ICD-10-CM | POA: Diagnosis not present

## 2023-03-10 DIAGNOSIS — M62830 Muscle spasm of back: Secondary | ICD-10-CM | POA: Diagnosis not present

## 2023-03-10 DIAGNOSIS — M5136 Other intervertebral disc degeneration, lumbar region: Secondary | ICD-10-CM | POA: Diagnosis not present

## 2023-03-10 DIAGNOSIS — M5416 Radiculopathy, lumbar region: Secondary | ICD-10-CM | POA: Diagnosis not present

## 2023-03-11 ENCOUNTER — Other Ambulatory Visit: Payer: Self-pay | Admitting: Certified Nurse Midwife

## 2023-03-11 DIAGNOSIS — M25562 Pain in left knee: Secondary | ICD-10-CM | POA: Diagnosis not present

## 2023-03-11 DIAGNOSIS — M6281 Muscle weakness (generalized): Secondary | ICD-10-CM | POA: Diagnosis not present

## 2023-03-11 DIAGNOSIS — Z96652 Presence of left artificial knee joint: Secondary | ICD-10-CM | POA: Diagnosis not present

## 2023-03-11 DIAGNOSIS — M25662 Stiffness of left knee, not elsewhere classified: Secondary | ICD-10-CM | POA: Diagnosis not present

## 2023-03-11 MED ORDER — ESTRADIOL-NORETHINDRONE ACET 1-0.5 MG PO TABS: 1.0000 | ORAL_TABLET | Freq: Every day | ORAL | 12 refills | Status: DC

## 2023-03-11 NOTE — Progress Notes (Signed)
Pt requesting alternative to prempro due to cost . Orders placed for Activella.   Sierra Moore, CNM

## 2023-03-17 DIAGNOSIS — M62830 Muscle spasm of back: Secondary | ICD-10-CM | POA: Diagnosis not present

## 2023-03-17 DIAGNOSIS — M5136 Other intervertebral disc degeneration, lumbar region: Secondary | ICD-10-CM | POA: Diagnosis not present

## 2023-03-17 DIAGNOSIS — M9903 Segmental and somatic dysfunction of lumbar region: Secondary | ICD-10-CM | POA: Diagnosis not present

## 2023-03-17 DIAGNOSIS — M5416 Radiculopathy, lumbar region: Secondary | ICD-10-CM | POA: Diagnosis not present

## 2023-03-19 ENCOUNTER — Encounter: Payer: Self-pay | Admitting: Family

## 2023-03-19 DIAGNOSIS — M25662 Stiffness of left knee, not elsewhere classified: Secondary | ICD-10-CM | POA: Diagnosis not present

## 2023-03-19 DIAGNOSIS — Z96652 Presence of left artificial knee joint: Secondary | ICD-10-CM | POA: Diagnosis not present

## 2023-03-19 DIAGNOSIS — M6281 Muscle weakness (generalized): Secondary | ICD-10-CM | POA: Diagnosis not present

## 2023-03-19 DIAGNOSIS — M25562 Pain in left knee: Secondary | ICD-10-CM | POA: Diagnosis not present

## 2023-03-22 ENCOUNTER — Other Ambulatory Visit: Payer: Self-pay | Admitting: Family

## 2023-03-22 DIAGNOSIS — M545 Other chronic pain: Secondary | ICD-10-CM

## 2023-03-24 DIAGNOSIS — Z96652 Presence of left artificial knee joint: Secondary | ICD-10-CM | POA: Diagnosis not present

## 2023-03-24 DIAGNOSIS — M6281 Muscle weakness (generalized): Secondary | ICD-10-CM | POA: Diagnosis not present

## 2023-03-24 DIAGNOSIS — M25562 Pain in left knee: Secondary | ICD-10-CM | POA: Diagnosis not present

## 2023-03-24 DIAGNOSIS — M25662 Stiffness of left knee, not elsewhere classified: Secondary | ICD-10-CM | POA: Diagnosis not present

## 2023-03-25 DIAGNOSIS — F413 Other mixed anxiety disorders: Secondary | ICD-10-CM | POA: Diagnosis not present

## 2023-03-29 DIAGNOSIS — M5126 Other intervertebral disc displacement, lumbar region: Secondary | ICD-10-CM | POA: Diagnosis not present

## 2023-03-29 DIAGNOSIS — M5416 Radiculopathy, lumbar region: Secondary | ICD-10-CM | POA: Diagnosis not present

## 2023-03-29 DIAGNOSIS — M5136 Other intervertebral disc degeneration, lumbar region: Secondary | ICD-10-CM | POA: Diagnosis not present

## 2023-04-01 ENCOUNTER — Other Ambulatory Visit: Payer: Self-pay | Admitting: Family

## 2023-04-01 DIAGNOSIS — M6281 Muscle weakness (generalized): Secondary | ICD-10-CM | POA: Diagnosis not present

## 2023-04-01 DIAGNOSIS — M25562 Pain in left knee: Secondary | ICD-10-CM | POA: Diagnosis not present

## 2023-04-01 DIAGNOSIS — Z8639 Personal history of other endocrine, nutritional and metabolic disease: Secondary | ICD-10-CM

## 2023-04-01 DIAGNOSIS — M25662 Stiffness of left knee, not elsewhere classified: Secondary | ICD-10-CM | POA: Diagnosis not present

## 2023-04-01 DIAGNOSIS — Z96652 Presence of left artificial knee joint: Secondary | ICD-10-CM | POA: Diagnosis not present

## 2023-04-07 DIAGNOSIS — M25662 Stiffness of left knee, not elsewhere classified: Secondary | ICD-10-CM | POA: Diagnosis not present

## 2023-04-07 DIAGNOSIS — M6281 Muscle weakness (generalized): Secondary | ICD-10-CM | POA: Diagnosis not present

## 2023-04-07 DIAGNOSIS — M25562 Pain in left knee: Secondary | ICD-10-CM | POA: Diagnosis not present

## 2023-04-07 DIAGNOSIS — Z96652 Presence of left artificial knee joint: Secondary | ICD-10-CM | POA: Diagnosis not present

## 2023-04-08 DIAGNOSIS — F413 Other mixed anxiety disorders: Secondary | ICD-10-CM | POA: Diagnosis not present

## 2023-04-08 NOTE — Telephone Encounter (Signed)
LVM to call back to office  

## 2023-04-14 DIAGNOSIS — M25662 Stiffness of left knee, not elsewhere classified: Secondary | ICD-10-CM | POA: Diagnosis not present

## 2023-04-14 DIAGNOSIS — Z96652 Presence of left artificial knee joint: Secondary | ICD-10-CM | POA: Diagnosis not present

## 2023-04-14 DIAGNOSIS — M25562 Pain in left knee: Secondary | ICD-10-CM | POA: Diagnosis not present

## 2023-04-14 DIAGNOSIS — M6281 Muscle weakness (generalized): Secondary | ICD-10-CM | POA: Diagnosis not present

## 2023-04-21 DIAGNOSIS — M6281 Muscle weakness (generalized): Secondary | ICD-10-CM | POA: Diagnosis not present

## 2023-04-21 DIAGNOSIS — M25562 Pain in left knee: Secondary | ICD-10-CM | POA: Diagnosis not present

## 2023-04-21 DIAGNOSIS — Z96652 Presence of left artificial knee joint: Secondary | ICD-10-CM | POA: Diagnosis not present

## 2023-04-21 DIAGNOSIS — M25662 Stiffness of left knee, not elsewhere classified: Secondary | ICD-10-CM | POA: Diagnosis not present

## 2023-04-22 DIAGNOSIS — F413 Other mixed anxiety disorders: Secondary | ICD-10-CM | POA: Diagnosis not present

## 2023-04-29 DIAGNOSIS — M6281 Muscle weakness (generalized): Secondary | ICD-10-CM | POA: Diagnosis not present

## 2023-04-29 DIAGNOSIS — Z96652 Presence of left artificial knee joint: Secondary | ICD-10-CM | POA: Diagnosis not present

## 2023-04-29 DIAGNOSIS — M25562 Pain in left knee: Secondary | ICD-10-CM | POA: Diagnosis not present

## 2023-04-29 DIAGNOSIS — M25662 Stiffness of left knee, not elsewhere classified: Secondary | ICD-10-CM | POA: Diagnosis not present

## 2023-05-03 DIAGNOSIS — F413 Other mixed anxiety disorders: Secondary | ICD-10-CM | POA: Diagnosis not present

## 2023-05-05 DIAGNOSIS — M25562 Pain in left knee: Secondary | ICD-10-CM | POA: Diagnosis not present

## 2023-05-05 DIAGNOSIS — M25662 Stiffness of left knee, not elsewhere classified: Secondary | ICD-10-CM | POA: Diagnosis not present

## 2023-05-05 DIAGNOSIS — M6281 Muscle weakness (generalized): Secondary | ICD-10-CM | POA: Diagnosis not present

## 2023-05-05 DIAGNOSIS — Z96652 Presence of left artificial knee joint: Secondary | ICD-10-CM | POA: Diagnosis not present

## 2023-06-14 ENCOUNTER — Other Ambulatory Visit: Payer: Self-pay | Admitting: Family Medicine

## 2023-06-14 DIAGNOSIS — M5126 Other intervertebral disc displacement, lumbar region: Secondary | ICD-10-CM

## 2023-06-15 ENCOUNTER — Inpatient Hospital Stay
Admission: RE | Admit: 2023-06-15 | Discharge: 2023-06-15 | Discharge status: Home / Self Care | Payer: Federal, State, Local not specified - PPO | Source: Ambulatory Visit | Attending: Family Medicine | Admitting: Family Medicine

## 2023-06-15 DIAGNOSIS — M5126 Other intervertebral disc displacement, lumbar region: Secondary | ICD-10-CM

## 2023-07-14 ENCOUNTER — Ambulatory Visit
Admission: RE | Admit: 2023-07-14 | Discharge: 2023-07-14 | Disposition: A | Discharge status: Home / Self Care | Payer: Federal, State, Local not specified - PPO | Source: Ambulatory Visit | Attending: Emergency Medicine | Admitting: Emergency Medicine

## 2023-07-14 VITALS — BP 166/84 | HR 94 | Temp 98.3°F | Resp 14

## 2023-07-14 DIAGNOSIS — R42 Dizziness and giddiness: Secondary | ICD-10-CM | POA: Diagnosis not present

## 2023-07-14 MED ORDER — MECLIZINE HCL 12.5 MG PO TABS: 12.5000 mg | ORAL_TABLET | Freq: Three times a day (TID) | ORAL | 0 refills | Status: AC | PRN

## 2023-07-14 NOTE — Discharge Instructions (Signed)
 Your evaluated for dizziness  There are no abnormalities to the ears and on exam there are no changes to your brain function that would indicate more serious cause however please continue to monitor at home  Begin use of a decongestant or mucolytic such as Robitussin and Mucinex to help clear congestion from the sinuses which may be contributing to symptoms  You may take meclizine , 1 to 2 tablets every 8 hours as needed for dizziness   change positions slowly, waiting at least 10 seconds between movements to help the body stabilize   Ensure adequate intake of water  as dehydration will make your symptoms worse  Schedule follow-up appointment with your primary doctor in 1-2 week

## 2023-07-14 NOTE — ED Triage Notes (Signed)
 Pt states she is having some dizziness when laying down and left ear pain that started Sunday. Taking tylenol.

## 2023-07-14 NOTE — ED Provider Notes (Signed)
 CAY RALPH PELT    CSN: [id number] Arrival date & time: 07/14/23  1021      History   Chief Complaint Chief Complaint  Patient presents with   Dizziness    Entered by patient   Otalgia    HPI Sierra Moore is a 53 y.o. female.    Evaluation of nasal congestion and intermittent dizziness present for 2 to 3 days, began to experience left-sided ear pain and fullness 1 day ago.  Dizziness is elicited when changing positions only when lying down, not affected when changing from a lying or sitting position.  Has been managing symptoms with Tylenol  and ibuprofen .  Has not occurred in the past.  Denies fevers, cough, lightheadedness, visual changes, chest pain or tightness, memory or speech changes or weakness.  Denies head injury, medication changes.  Past Medical History:  Diagnosis Date   Allergy    Arthritis    POLYARTHRALGIA   GERD (gastroesophageal reflux disease)    occ   History of kidney stones    currently as of 12-25-20   Leg swelling    Obesity    PMDD (premenstrual dysphoric disorder)    PMDD (premenstrual dysphoric disorder)    Raynaud disease    Vitamin D  deficiency     Patient Active Problem List   Diagnosis Date Noted   Low back pain 02/02/2023   Adrenal adenoma, left 12/02/2022   Total knee replacement status 11/24/2021   Left lower quadrant abdominal pain 04/21/2021   S/P total knee arthroplasty, right 01/03/2021   Marital problem 12/30/2020   Primary osteoarthritis of both knees 05/09/2019   Primary osteoarthritis of left knee 05/09/2019   Special screening for malignant neoplasms, colon    Polyp of descending colon    Other chest pain    Onychomycosis of toenail 12/27/2018   Polyarthralgia 12/27/2018   HLD (hyperlipidemia) 12/21/2018   Right foot pain 12/07/2018   Eczema 03/04/2017   Raynaud's phenomenon without gangrene 03/04/2017   Dysuria 09/25/2016   Mixed stress and urge urinary incontinence 09/25/2016   Mild obesity 03/04/2016    Seasonal allergies 02/10/2016   Encounter for routine adult physical exam with abnormal findings 02/10/2016   Heat intolerance 02/10/2016   Hematuria 02/10/2016   Vitamin D  deficiency 10/25/2015   Diabetes (HCC) 10/25/2015   IBS (irritable bowel syndrome) 10/24/2015    Past Surgical History:  Procedure Laterality Date   COLONOSCOPY WITH PROPOFOL  N/A 04/27/2019   Procedure: COLONOSCOPY WITH BIOPSY;  Surgeon: Jinny Carmine, MD;  Location: [facility];  Service: Endoscopy;  Laterality: N/A;   ESOPHAGOGASTRODUODENOSCOPY (EGD) WITH PROPOFOL  N/A 04/27/2019   Procedure: ESOPHAGOGASTRODUODENOSCOPY (EGD) WITH PROPOFOL ;  Surgeon: Jinny Carmine, MD;  Location: [facility];  Service: Endoscopy;  Laterality: N/A;   KNEE ARTHROPLASTY Right 01/03/2021   Procedure: COMPUTER ASSISTED TOTAL KNEE ARTHROPLASTY - RNFA;  Surgeon: Mardee Lynwood SQUIBB, MD;  Location: [facility];  Service: Orthopedics;  Laterality: Right;   KNEE ARTHROPLASTY Left 11/24/2021   Procedure: COMPUTER ASSISTED TOTAL KNEE ARTHROPLASTY;  Surgeon: Mardee Lynwood SQUIBB, MD;  Location: [facility];  Service: Orthopedics;  Laterality: Left;   POLYPECTOMY N/A 04/27/2019   Procedure: POLYPECTOMY;  Surgeon: Jinny Carmine, MD;  Location: [facility];  Service: Endoscopy;  Laterality: N/A;   ROBOTIC ASSISTED LAPAROSCOPIC CHOLECYSTECTOMY N/A 01/10/2019   Procedure: ROBOTIC ASSISTED LAPAROSCOPIC CHOLECYSTECTOMY;  Surgeon: Jordis Laneta FALCON, MD;  Location: [facility];  Service: General;  Laterality: N/A;   TONSILLECTOMY      OB History  Gravida  2   Para  2   Term  2   Preterm      AB      Living  2      SAB      IAB      Ectopic      Multiple      Live Births  2            Home Medications    Prior to Admission medications   Medication Sig Start Date End Date Taking? Authorizing Provider  Cholecalciferol  1.25 MG (50000 UT) TABS 50,000 units PO qwk for 8 weeks. 02/04/23  Yes Arnett, Rollene MATSU, FNP   estradiol -norethindrone  (ACTIVELLA) 1-0.5 MG tablet Take 1 tablet by mouth daily. 03/11/23  Yes Sebastian Sham, CNM  meclizine  (ANTIVERT ) 12.5 MG tablet Take 1 tablet (12.5 mg total) by mouth 3 (three) times daily as needed for dizziness. 07/14/23  Yes Deyanira Fesler R, NP  acetaminophen  (TYLENOL ) 500 MG tablet Take 500-1,000 mg by mouth every 6 (six) hours as needed for moderate pain.    [provider]    Family History Family History  Problem Relation Age of Onset   Arthritis Mother    Raynaud syndrome Mother    Arthritis Father    Breast cancer Neg Hx    Bladder Cancer Neg Hx    Kidney cancer Neg Hx     Social History Social History   Tobacco Use   Smoking status: Former    Current packs/day: 0.00    Average packs/day: 0.5 packs/day for 10.0 years (5.0 ttl pk-yrs)    Types: Cigarettes    Start date: 15    Quit date: 2005    Years since quitting: 20.0   Smokeless tobacco: Never  Vaping Use   Vaping status: Never Used  Substance Use Topics   Alcohol use: No   Drug use: No     Allergies   Penicillins   Review of Systems Review of Systems   Physical Exam Triage Vital Signs ED Triage Vitals  Encounter Vitals Group     BP 07/14/23 1039 (!) 166/84     Systolic BP Percentile --      Diastolic BP Percentile --      Pulse Rate 07/14/23 1039 94     Resp 07/14/23 1039 14     Temp 07/14/23 1039 98.3 F (36.8 C)     Temp Source 07/14/23 1039 Oral     SpO2 07/14/23 1039 96 %     Weight --      Height --      Head Circumference --      Peak Flow --      Pain Score 07/14/23 1042 3     Pain Loc --      Pain Education --      Exclude from Growth Chart --    No data found.  Updated Vital Signs BP (!) 166/84 (BP Location: Left Arm)   Pulse 94   Temp 98.3 F (36.8 C) (Oral)   Resp 14   SpO2 96%   Visual Acuity Right Eye Distance:   Left Eye Distance:   Bilateral Distance:    Right Eye Near:   Left Eye Near:    Bilateral Near:     Physical  Exam Constitutional:      Appearance: Normal appearance.  HENT:     Right Ear: Tympanic membrane, ear canal and external ear normal.     Left Ear:  Tympanic membrane, ear canal and external ear normal.     Nose: Congestion present. No rhinorrhea.  Eyes:     Extraocular Movements: Extraocular movements intact.     Conjunctiva/sclera: Conjunctivae normal.     Pupils: Pupils are equal, round, and reactive to light.  Pulmonary:     Effort: Pulmonary effort is normal.  Neurological:     General: No focal deficit present.     Mental Status: She is alert and oriented to person, place, and time. Mental status is at baseline.     Cranial Nerves: No cranial nerve deficit.     Sensory: No sensory deficit.     Motor: No weakness.     Coordination: Coordination normal.     Gait: Gait normal.      UC Treatments / Results  Labs (all labs ordered are listed, but only abnormal results are displayed) Labs Reviewed - No data to display  EKG   Radiology No results found.  Procedures Procedures (including critical care time)  Medications Ordered in UC Medications - No data to display  Initial Impression / Assessment and Plan / UC Course  I have reviewed the triage vital signs and the nursing notes.  Pertinent labs & imaging results that were available during my care of the patient were reviewed by me and considered in my medical decision making (see chart for details).  Dizziness  No abnormality to the ear, no neurological deficits, stable for outpatient management, vitals are stable, will move forward with symptomatic treatment, prescribed meclizine  and recommended use of mucolytic or decongestant to further reduce congestion, advise changing positions slowly and for follow-up with PCP in 1 to 2 weeks Final Clinical Impressions(s) / UC Diagnoses   Final diagnoses:  Dizziness     Discharge Instructions      Your evaluated for dizziness  There are no abnormalities to the ears  and on exam there are no changes to your brain function that would indicate more serious cause however please continue to monitor at home  Begin use of a decongestant or mucolytic such as Robitussin and Mucinex to help clear congestion from the sinuses which may be contributing to symptoms  You may take meclizine , 1 to 2 tablets every 8 hours as needed for dizziness   change positions slowly, waiting at least 10 seconds between movements to help the body stabilize   Ensure adequate intake of water  as dehydration will make your symptoms worse  Schedule follow-up appointment with your primary doctor in 1-2 week     ED Prescriptions     Medication Sig Dispense Auth. Provider   meclizine  (ANTIVERT ) 12.5 MG tablet Take 1 tablet (12.5 mg total) by mouth 3 (three) times daily as needed for dizziness. 30 tablet Shaquanta Harkless R, NP      PDMP not reviewed this encounter.   Teresa Shelba SAUNDERS, NP 07/14/23 1102

## 2023-08-16 ENCOUNTER — Ambulatory Visit
Admission: RE | Admit: 2023-08-16 | Discharge: 2023-08-16 | Disposition: A | Discharge status: Home / Self Care | Payer: Federal, State, Local not specified - PPO | Source: Ambulatory Visit | Attending: Emergency Medicine | Admitting: Emergency Medicine

## 2023-08-16 VITALS — BP 138/87 | HR 90 | Temp 98.6°F | Resp 17

## 2023-08-16 DIAGNOSIS — J069 Acute upper respiratory infection, unspecified: Secondary | ICD-10-CM | POA: Diagnosis not present

## 2023-08-16 MED ORDER — AZITHROMYCIN 250 MG PO TABS: 250.0000 mg | ORAL_TABLET | Freq: Every day | ORAL | 0 refills | Status: DC

## 2023-08-16 NOTE — Discharge Instructions (Signed)
 Begin azithromycin  which will provide bacterial coverage    You can take Tylenol  and/or Ibuprofen  as needed for fever reduction and pain relief.   For cough: honey 1/2 to 1 teaspoon (you can dilute the honey in water  or another fluid).  You can also use guaifenesin and dextromethorphan for cough. You can use a humidifier for chest congestion and cough.  If you don't have a humidifier, you can sit in the bathroom with the hot shower running.      For sore throat: try warm salt water  gargles, cepacol lozenges, throat spray, warm tea or water  with lemon/honey, popsicles or ice, or OTC cold relief medicine for throat discomfort.   For congestion: take a daily anti-histamine like Zyrtec, Claritin, and a oral decongestant, such as pseudoephedrine.  You can also use Flonase  1-2 sprays in each nostril daily.   It is important to stay hydrated: drink plenty of fluids (water , gatorade/powerade/pedialyte, juices, or teas) to keep your throat moisturized and help further relieve irritation/discomfort.

## 2023-08-16 NOTE — ED Triage Notes (Signed)
 Sx x 1 week  Fatigue Sinuses pressure Nasal congestion Wheezing.

## 2023-08-16 NOTE — ED Provider Notes (Signed)
 Sierra Moore    CSN: [id number] Arrival date & time: 08/16/23  1152      History   Chief Complaint Chief Complaint  Patient presents with   Wheezing    Tired, sinus drainage, throat wheezing. No fever or body aches! - Entered by patient   Fatigue   Facial Pain   Nasal Congestion    HPI Sierra Moore is a 53 y.o. female.   Patient presents for evaluation of congestion, sinus pressure, productive cough, intermittent headaches and wheezing to the throat.  Wheezing interfering with sleep.  Associated left-sided ear pain. Has used inhaler , antihistamines and hot tea with honey.  Tolerating food and liquids.  Denies fever.  Past Medical History:  Diagnosis Date   Allergy    Arthritis    POLYARTHRALGIA   GERD (gastroesophageal reflux disease)    occ   History of kidney stones    currently as of 12-25-20   Leg swelling    Obesity    PMDD (premenstrual dysphoric disorder)    PMDD (premenstrual dysphoric disorder)    Raynaud disease    Vitamin D  deficiency     Patient Active Problem List   Diagnosis Date Noted   Low back pain 02/02/2023   Adrenal adenoma, left 12/02/2022   Total knee replacement status 11/24/2021   Left lower quadrant abdominal pain 04/21/2021   S/P total knee arthroplasty, right 01/03/2021   Marital problem 12/30/2020   Primary osteoarthritis of both knees 05/09/2019   Primary osteoarthritis of left knee 05/09/2019   Special screening for malignant neoplasms, colon    Polyp of descending colon    Other chest pain    Onychomycosis of toenail 12/27/2018   Polyarthralgia 12/27/2018   HLD (hyperlipidemia) 12/21/2018   Right foot pain 12/07/2018   Eczema 03/04/2017   Raynaud's phenomenon without gangrene 03/04/2017   Dysuria 09/25/2016   Mixed stress and urge urinary incontinence 09/25/2016   Mild obesity 03/04/2016   Seasonal allergies 02/10/2016   Encounter for routine adult physical exam with abnormal findings 02/10/2016   Heat  intolerance 02/10/2016   Hematuria 02/10/2016   Vitamin D  deficiency 10/25/2015   Diabetes (HCC) 10/25/2015   IBS (irritable bowel syndrome) 10/24/2015    Past Surgical History:  Procedure Laterality Date   COLONOSCOPY WITH PROPOFOL  N/A 04/27/2019   Procedure: COLONOSCOPY WITH BIOPSY;  Surgeon: Marnee Sink, MD;  Location: [facility];  Service: Endoscopy;  Laterality: N/A;   ESOPHAGOGASTRODUODENOSCOPY (EGD) WITH PROPOFOL  N/A 04/27/2019   Procedure: ESOPHAGOGASTRODUODENOSCOPY (EGD) WITH PROPOFOL ;  Surgeon: Marnee Sink, MD;  Location: [facility];  Service: Endoscopy;  Laterality: N/A;   KNEE ARTHROPLASTY Right 01/03/2021   Procedure: COMPUTER ASSISTED TOTAL KNEE ARTHROPLASTY - RNFA;  Surgeon: Arlyne Lame, MD;  Location: [facility];  Service: Orthopedics;  Laterality: Right;   KNEE ARTHROPLASTY Left 11/24/2021   Procedure: COMPUTER ASSISTED TOTAL KNEE ARTHROPLASTY;  Surgeon: Arlyne Lame, MD;  Location: [facility];  Service: Orthopedics;  Laterality: Left;   POLYPECTOMY N/A 04/27/2019   Procedure: POLYPECTOMY;  Surgeon: Marnee Sink, MD;  Location: [facility];  Service: Endoscopy;  Laterality: N/A;   ROBOTIC ASSISTED LAPAROSCOPIC CHOLECYSTECTOMY N/A 01/10/2019   Procedure: ROBOTIC ASSISTED LAPAROSCOPIC CHOLECYSTECTOMY;  Surgeon: Alben Alma, MD;  Location: [facility];  Service: General;  Laterality: N/A;   TONSILLECTOMY      OB History     Gravida  2   Para  2   Term  2   Preterm  AB      Living  2      SAB      IAB      Ectopic      Multiple      Live Births  2            Home Medications    Prior to Admission medications   Medication Sig Start Date End Date Taking? Authorizing Provider  azithromycin  (ZITHROMAX ) 250 MG tablet Take 1 tablet (250 mg total) by mouth daily. Take first 2 tablets together, then 1 every day until finished. 08/16/23  Yes Sharian Delia R, NP  estradiol -norethindrone  (ACTIVELLA) 1-0.5 MG tablet Take 1  tablet by mouth daily. 03/11/23  Yes Alise Appl, CNM  acetaminophen  (TYLENOL ) 500 MG tablet Take 500-1,000 mg by mouth every 6 (six) hours as needed for moderate pain.    [provider]  Cholecalciferol  1.25 MG (50000 UT) TABS 50,000 units PO qwk for 8 weeks. 02/04/23   Calista Catching, FNP  meclizine  (ANTIVERT ) 12.5 MG tablet Take 1 tablet (12.5 mg total) by mouth 3 (three) times daily as needed for dizziness. 07/14/23   Reena Canning, NP    Family History Family History  Problem Relation Age of Onset   Arthritis Mother    Raynaud syndrome Mother    Arthritis Father    Breast cancer Neg Hx    Bladder Cancer Neg Hx    Kidney cancer Neg Hx     Social History Social History   Tobacco Use   Smoking status: Former    Current packs/day: 0.00    Average packs/day: 0.5 packs/day for 10.0 years (5.0 ttl pk-yrs)    Types: Cigarettes    Start date: 62    Quit date: 2005    Years since quitting: 20.1   Smokeless tobacco: Never  Vaping Use   Vaping status: Never Used  Substance Use Topics   Alcohol use: No   Drug use: No     Allergies   Penicillins   Review of Systems Review of Systems   Physical Exam Triage Vital Signs ED Triage Vitals  Encounter Vitals Group     BP 08/16/23 1232 138/87     Systolic BP Percentile --      Diastolic BP Percentile --      Pulse Rate 08/16/23 1232 90     Resp 08/16/23 1232 17     Temp 08/16/23 1232 98.6 F (37 C)     Temp Source 08/16/23 1232 Oral     SpO2 08/16/23 1232 96 %     Weight --      Height --      Head Circumference --      Peak Flow --      Pain Score 08/16/23 1231 0     Pain Loc --      Pain Education --      Exclude from Growth Chart --    No data found.  Updated Vital Signs BP 138/87 (BP Location: Left Arm)   Pulse 90   Temp 98.6 F (37 C) (Oral)   Resp 17   SpO2 96%   Visual Acuity Right Eye Distance:   Left Eye Distance:   Bilateral Distance:    Right Eye Near:   Left Eye Near:     Bilateral Near:     Physical Exam Constitutional:      Appearance: Normal appearance.  HENT:     Head: Normocephalic.  Right Ear: Tympanic membrane, ear canal and external ear normal.     Left Ear: Tympanic membrane, ear canal and external ear normal.     Nose: Congestion present. No rhinorrhea.     Mouth/Throat:     Mouth: Mucous membranes are moist.     Pharynx: Oropharynx is clear.  Eyes:     Extraocular Movements: Extraocular movements intact.  Cardiovascular:     Rate and Rhythm: Normal rate and regular rhythm.     Pulses: Normal pulses.     Heart sounds: Normal heart sounds.  Pulmonary:     Effort: Pulmonary effort is normal.     Breath sounds: Normal breath sounds.  Musculoskeletal:     Cervical back: Normal range of motion and neck supple.  Skin:    General: Skin is warm and dry.  Neurological:     Mental Status: She is alert and oriented to person, place, and time. Mental status is at baseline.      UC Treatments / Results  Labs (all labs ordered are listed, but only abnormal results are displayed) Labs Reviewed - No data to display  EKG   Radiology No results found.  Procedures Procedures (including critical care time)  Medications Ordered in UC Medications - No data to display  Initial Impression / Assessment and Plan / UC Course  I have reviewed the triage vital signs and the nursing notes.  Pertinent labs & imaging results that were available during my care of the patient were reviewed by me and considered in my medical decision making (see chart for details).  Acute URI  Patient is in no signs of distress nor toxic appearing.  Vital signs are stable.  Low suspicion for pneumonia, pneumothorax or bronchitis and therefore will defer imaging.prescribed azithromycin , declined steroids.May use additional over-the-counter medications as needed for supportive care.  May follow-up with urgent care as needed if symptoms persist or worsen.  Final  Clinical Impressions(s) / UC Diagnoses   Final diagnoses:  Acute URI     Discharge Instructions      Begin azithromycin  which will provide bacterial coverage    You can take Tylenol  and/or Ibuprofen  as needed for fever reduction and pain relief.   For cough: honey 1/2 to 1 teaspoon (you can dilute the honey in water  or another fluid).  You can also use guaifenesin and dextromethorphan for cough. You can use a humidifier for chest congestion and cough.  If you don't have a humidifier, you can sit in the bathroom with the hot shower running.      For sore throat: try warm salt water  gargles, cepacol lozenges, throat spray, warm tea or water  with lemon/honey, popsicles or ice, or OTC cold relief medicine for throat discomfort.   For congestion: take a daily anti-histamine like Zyrtec, Claritin, and a oral decongestant, such as pseudoephedrine.  You can also use Flonase  1-2 sprays in each nostril daily.   It is important to stay hydrated: drink plenty of fluids (water , gatorade/powerade/pedialyte, juices, or teas) to keep your throat moisturized and help further relieve irritation/discomfort.    ED Prescriptions     Medication Sig Dispense Auth. Provider   azithromycin  (ZITHROMAX ) 250 MG tablet Take 1 tablet (250 mg total) by mouth daily. Take first 2 tablets together, then 1 every day until finished. 6 tablet Demarcus Thielke R, NP      PDMP not reviewed this encounter.   Reena Canning, NP 08/16/23 1402

## 2023-09-22 ENCOUNTER — Encounter: Payer: Self-pay | Admitting: Family

## 2023-09-22 NOTE — Telephone Encounter (Signed)
 Lvm for pt to give office a call back, no labs needed pt needs appt for evaluation.

## 2023-09-22 NOTE — Telephone Encounter (Signed)
 Noted.

## 2023-09-30 ENCOUNTER — Encounter: Payer: Self-pay | Admitting: Family

## 2023-09-30 ENCOUNTER — Ambulatory Visit: Admitting: Family

## 2023-09-30 VITALS — BP 130/76 | HR 82 | Temp 97.7°F | Ht 61.0 in | Wt 222.0 lb

## 2023-09-30 DIAGNOSIS — R21 Rash and other nonspecific skin eruption: Secondary | ICD-10-CM | POA: Diagnosis not present

## 2023-09-30 DIAGNOSIS — E119 Type 2 diabetes mellitus without complications: Secondary | ICD-10-CM

## 2023-09-30 DIAGNOSIS — E559 Vitamin D deficiency, unspecified: Secondary | ICD-10-CM | POA: Diagnosis not present

## 2023-09-30 DIAGNOSIS — Z1231 Encounter for screening mammogram for malignant neoplasm of breast: Secondary | ICD-10-CM

## 2023-09-30 LAB — CBC WITH DIFFERENTIAL/PLATELET
Basophils Absolute: 0.1 10*3/uL (ref 0.0–0.1)
Basophils Relative: 0.6 % (ref 0.0–3.0)
Eosinophils Absolute: 0.1 10*3/uL (ref 0.0–0.7)
Eosinophils Relative: 1.1 % (ref 0.0–5.0)
HCT: 45.4 % (ref 36.0–46.0)
Hemoglobin: 14.3 g/dL (ref 12.0–15.0)
Lymphocytes Relative: 14.6 % (ref 12.0–46.0)
Lymphs Abs: 1.7 10*3/uL (ref 0.7–4.0)
MCHC: 31.6 g/dL (ref 30.0–36.0)
MCV: 77.7 fl — ABNORMAL LOW (ref 78.0–100.0)
Monocytes Absolute: 0.7 10*3/uL (ref 0.1–1.0)
Monocytes Relative: 6.4 % (ref 3.0–12.0)
Neutro Abs: 8.8 10*3/uL — ABNORMAL HIGH (ref 1.4–7.7)
Neutrophils Relative %: 77.3 % — ABNORMAL HIGH (ref 43.0–77.0)
Platelets: 378 10*3/uL (ref 150.0–400.0)
RBC: 5.84 Mil/uL — ABNORMAL HIGH (ref 3.87–5.11)
RDW: 16.8 % — ABNORMAL HIGH (ref 11.5–15.5)
WBC: 11.3 10*3/uL — ABNORMAL HIGH (ref 4.0–10.5)

## 2023-09-30 LAB — COMPREHENSIVE METABOLIC PANEL WITH GFR
ALT: 14 U/L (ref 0–35)
AST: 15 U/L (ref 0–37)
Albumin: 4.1 g/dL (ref 3.5–5.2)
Alkaline Phosphatase: 110 U/L (ref 39–117)
BUN: 15 mg/dL (ref 6–23)
CO2: 27 meq/L (ref 19–32)
Calcium: 9.2 mg/dL (ref 8.4–10.5)
Chloride: 102 meq/L (ref 96–112)
Creatinine, Ser: 0.79 mg/dL (ref 0.40–1.20)
GFR: 84.91 mL/min (ref >60.00)
Glucose, Bld: 117 mg/dL — ABNORMAL HIGH (ref 70–99)
Potassium: 4.2 meq/L (ref 3.5–5.1)
Sodium: 137 meq/L (ref 135–145)
Total Bilirubin: 0.6 mg/dL (ref 0.2–1.2)
Total Protein: 7.2 g/dL (ref 6.0–8.3)

## 2023-09-30 LAB — SEDIMENTATION RATE: Sed Rate: 45 mm/h — ABNORMAL HIGH (ref 0–30)

## 2023-09-30 LAB — C-REACTIVE PROTEIN: CRP: 2.6 mg/dL (ref 0.5–20.0)

## 2023-09-30 NOTE — Assessment & Plan Note (Addendum)
 Centrofacial redness. Advised to start prescribed treatment(doxycycline, azelaic acid) for rosacea by dermatology, Dr Cheree Ditto.  Consider second opinion with rheumatology (last seen 2020 for suspected osteoarthritis) or allergist if symptoms do not resolve. Pending labs.

## 2023-09-30 NOTE — Progress Notes (Signed)
 Assessment & Plan:  Rash Assessment & Plan: Centrofacial redness. Advised to start prescribed treatment(doxycycline, azelaic acid) for rosacea by dermatology, Dr Cheree Ditto.  Consider second opinion with rheumatology (last seen 2020 for suspected osteoarthritis) or allergist if symptoms do not resolve. Pending labs.   Orders: -     ANA -     C-reactive protein -     CYCLIC CITRUL PEPTIDE ANTIBODY, IGG/IGA -     Rheumatoid factor -     Sedimentation rate -     Lupus anticoagulant panel -     B12 and Folate Panel  Type 2 diabetes mellitus without complication, without long-term current use of insulin (HCC) -     Hemoglobin A1c -     Comprehensive metabolic panel with GFR -     CBC with Differential/Platelet  Vitamin D deficiency -     VITAMIN D 25 Hydroxy (Vit-D Deficiency, Fractures)  Encounter for screening mammogram for malignant neoplasm of breast     Return precautions given.   Risks, benefits, and alternatives of the medications and treatment plan prescribed today were discussed, and patient expressed understanding.   Education regarding symptom management and diagnosis given to patient on AVS either electronically or printed.  No follow-ups on file.  Sierra Plowman, FNP  Subjective:    Patient ID: Sierra Moore, female    DOB: Mar 16, 1971, 53 y.o.   MRN: [id number]  CC: Sierra Moore is a 53 y.o. female who presents today for follow up.   HPI: She has had hyperpigmented facial rash across her face cheeks for the past couple of weeks.   She has yet to start however has been prescribed doxycycline 1 month course, azelaic acid from dermatology.   She is concerned for SLE.  No family h/o SLE.   Chronic low back pain.   Episodic pain in left great toe. Otherwise no joint pain, hands or feet.    Last seen by Dr. Beatrix Fetters 12/30/22 adrenal adenoma, no further follow up.  She follows with Dr Cheree Ditto, dermatology for suspected rosacea  Consult with Dr Renard Matter ,  rheumatology 05/09/19 primary osteoarthritis in BL knees.  ANA negative 2020.    Allergies: Penicillins Current Outpatient Medications on File Prior to Visit  Medication Sig Dispense Refill   acetaminophen (TYLENOL) 500 MG tablet Take 500-1,000 mg by mouth every 6 (six) hours as needed for moderate pain.     estradiol-norethindrone (ACTIVELLA) 1-0.5 MG tablet Take 1 tablet by mouth daily. 30 tablet 12   meclizine (ANTIVERT) 12.5 MG tablet Take 1 tablet (12.5 mg total) by mouth 3 (three) times daily as needed for dizziness. 30 tablet 0   No current facility-administered medications on file prior to visit.    Review of Systems  Constitutional:  Negative for chills and fever.  Respiratory:  Negative for cough.   Cardiovascular:  Negative for chest pain and palpitations.  Gastrointestinal:  Negative for nausea and vomiting.  Musculoskeletal:  Positive for back pain.  Skin:  Positive for rash.      Objective:    BP 130/76   Pulse 82   Temp 97.7 F (36.5 C) (Oral)   Ht 5\' 1"  (1.549 m)   Wt 222 lb (100.7 kg)   LMP  (LMP Unknown)   SpO2 98%   BMI 41.95 kg/m  BP Readings from Last 3 Encounters:  09/30/23 130/76  08/16/23 138/87  07/14/23 (!) 166/84   Wt Readings from Last 3 Encounters:  09/30/23 222 lb (100.7 kg)  02/02/23 218 lb 12.8 oz (99.2 kg)  12/02/22 217 lb 9.6 oz (98.7 kg)    Physical Exam Vitals reviewed.  Constitutional:      Appearance: She is well-developed.  HENT:     Head:      Comments: centrofacial redness without pustules.  Eyes:     Conjunctiva/sclera: Conjunctivae normal.  Cardiovascular:     Rate and Rhythm: Normal rate and regular rhythm.     Pulses: Normal pulses.     Heart sounds: Normal heart sounds.  Pulmonary:     Effort: Pulmonary effort is normal.     Breath sounds: Normal breath sounds. No wheezing, rhonchi or rales.  Skin:    General: Skin is warm and dry.  Neurological:     Mental Status: She is alert.  Psychiatric:         Speech: Speech normal.        Behavior: Behavior normal.        Thought Content: Thought content normal.

## 2023-10-01 LAB — B12 AND FOLATE PANEL
Folate: 6 ng/mL (ref >5.9)
Vitamin B-12: 484 pg/mL (ref 211–911)

## 2023-10-01 LAB — VITAMIN D 25 HYDROXY (VIT D DEFICIENCY, FRACTURES): VITD: 34.19 ng/mL (ref 30.00–100.00)

## 2023-10-02 LAB — HEMOGLOBIN A1C: Hgb A1c MFr Bld: 6.6 % — ABNORMAL HIGH (ref 4.6–6.5)

## 2023-10-03 LAB — ANTI-NUCLEAR AB-TITER (ANA TITER)
ANA TITER: 1:40 {titer} — ABNORMAL HIGH
ANA Titer 1: 1:40 {titer} — ABNORMAL HIGH

## 2023-10-03 LAB — RHEUMATOID FACTOR: Rheumatoid fact SerPl-aCnc: 32 [IU]/mL — ABNORMAL HIGH (ref <14)

## 2023-10-03 LAB — ANA: Anti Nuclear Antibody (ANA): POSITIVE — AB

## 2023-10-05 ENCOUNTER — Encounter: Payer: Self-pay | Admitting: Family

## 2023-10-05 ENCOUNTER — Other Ambulatory Visit: Payer: Self-pay | Admitting: Family

## 2023-10-05 DIAGNOSIS — M069 Rheumatoid arthritis, unspecified: Secondary | ICD-10-CM | POA: Insufficient documentation

## 2023-10-05 DIAGNOSIS — R768 Other specified abnormal immunological findings in serum: Secondary | ICD-10-CM | POA: Insufficient documentation

## 2023-10-05 DIAGNOSIS — R899 Unspecified abnormal finding in specimens from other organs, systems and tissues: Secondary | ICD-10-CM

## 2023-10-05 LAB — LUPUS ANTICOAGULANT PANEL
Dilute Viper Venom Time: 36 s (ref 0.0–47.0)
PTT Lupus Anticoagulant: 33.3 s (ref 0.0–43.5)

## 2023-10-05 LAB — CYCLIC CITRUL PEPTIDE ANTIBODY, IGG/IGA: Cyclic Citrullin Peptide Ab: 7 U (ref 0–19)

## 2023-10-07 ENCOUNTER — Telehealth: Payer: Self-pay

## 2023-10-07 NOTE — Telephone Encounter (Signed)
 Copied from CRM [id number]. Topic: Referral - Question >> Oct 07, 2023  9:04 AM Pascal Lux wrote: Reason for CRM: Patient stated she got a referral to [facility] and wanted to go somewhere more local. Requesting to change it to [facility] because she's been there before.

## 2023-10-12 NOTE — Telephone Encounter (Signed)
 Referral moved and faxed to   [facility] [address] [place], [place] [address]  Letter sent to patient in Mychart with location change info. And who to contact for scheduling

## 2023-12-08 ENCOUNTER — Encounter: Payer: Self-pay | Admitting: Podiatry

## 2023-12-08 ENCOUNTER — Ambulatory Visit: Admitting: Podiatry

## 2023-12-08 ENCOUNTER — Ambulatory Visit (INDEPENDENT_AMBULATORY_CARE_PROVIDER_SITE_OTHER)

## 2023-12-08 DIAGNOSIS — M722 Plantar fascial fibromatosis: Secondary | ICD-10-CM | POA: Diagnosis not present

## 2023-12-08 DIAGNOSIS — M76821 Posterior tibial tendinitis, right leg: Secondary | ICD-10-CM

## 2023-12-08 DIAGNOSIS — M7752 Other enthesopathy of left foot: Secondary | ICD-10-CM

## 2023-12-08 DIAGNOSIS — M778 Other enthesopathies, not elsewhere classified: Secondary | ICD-10-CM

## 2023-12-08 DIAGNOSIS — L603 Nail dystrophy: Secondary | ICD-10-CM | POA: Diagnosis not present

## 2023-12-08 MED ORDER — TRIAMCINOLONE ACETONIDE 40 MG/ML IJ SUSP
20.0000 mg | Freq: Once | INTRAMUSCULAR | Status: AC
  Administered 2023-12-08: 20 mg

## 2023-12-08 NOTE — Progress Notes (Addendum)
 She presents today states that she had a recent flareup of the pain to her right.  She also has pain around the medial ankle and she is also having pain around the first metatarsal phalangeal joint.  Objective: Vital signs are stable alert oriented x 3.  Pulses are palpable.  She has limited range of motion of the first metatarsal phalangeal joint of the right foot none on the left foot.  Right foot does demonstrate tenderness on palpation of the posterior tibial tendon at its insertion site and just beneath the medial malleolus.  Mild pes planovalgus on the right foot.  Radiographs taken today demonstrate an osseously mature foot mild osteoarthritis of the first metatarsal phalangeal joint of the right foot mild to moderate metatarsus adductus on the left foot with no significant arthritic changes.  Assessment: Plantar fasciitis posterior tibial tendinitis and osteoarthritis first metatarsophalangeal joint right foot.  Plan: Injected the right heel today 20 mg Kenalog  5 mg Marcaine  point maximal tenderness.  Tolerated the procedure well without complications.  She will follow-up with Kerney Pee for orthotics.  She has the orthotics but the right orthotic seems to digging into her navicular tuberosity plantarly does not appear to be wide enough.  Samples were taken today for nail pathology primarily from the hallux right.

## 2023-12-08 NOTE — Addendum Note (Signed)
 Addended by: Sanda Crome on: 12/08/2023 03:34 PM   Modules accepted: Orders

## 2023-12-17 ENCOUNTER — Other Ambulatory Visit: Payer: Self-pay | Admitting: Podiatry

## 2023-12-23 ENCOUNTER — Ambulatory Visit: Payer: Self-pay | Admitting: Podiatry

## 2024-01-05 ENCOUNTER — Ambulatory Visit: Admitting: Podiatry

## 2024-01-19 ENCOUNTER — Ambulatory Visit: Admitting: Podiatry

## 2024-01-19 DIAGNOSIS — L603 Nail dystrophy: Secondary | ICD-10-CM | POA: Diagnosis not present

## 2024-01-19 MED ORDER — TERBINAFINE HCL 250 MG PO TABS
250.0000 mg | ORAL_TABLET | Freq: Every day | ORAL | 0 refills | Status: AC
Start: 2024-01-19 — End: 2024-02-18

## 2024-01-19 NOTE — Progress Notes (Signed)
 She presents today for her pathology results.  Objective: Pathology demonstrates nail dystrophy with Trichophyton rubrum.  Assessment: Onychomycosis and onychodystrophy.  Plan: Discussed oral therapy laser therapy and topical therapy.  At this point she is going to start oral therapy because of the best outcome.  We will follow-up with her in 30 days for blood work.  Recent blood work does not demonstrate an increase in ALT or AST liver enzymes.  Her alkaline phosphatase was slightly increased but more than likely due to Plaquenil.  Started her on Lamisil  250 mg tablets.  She will take 1 tablet by mouth daily #30 dispensed follow-up 1 month

## 2024-01-20 NOTE — Addendum Note (Signed)
 Addended by: ELAYNE KNEE E on: 01/20/2024 08:03 PM   Modules accepted: Level of Service

## 2024-01-28 ENCOUNTER — Ambulatory Visit (INDEPENDENT_AMBULATORY_CARE_PROVIDER_SITE_OTHER)

## 2024-01-28 DIAGNOSIS — M722 Plantar fascial fibromatosis: Secondary | ICD-10-CM

## 2024-01-28 DIAGNOSIS — M7752 Other enthesopathy of left foot: Secondary | ICD-10-CM

## 2024-01-28 DIAGNOSIS — M778 Other enthesopathies, not elsewhere classified: Secondary | ICD-10-CM

## 2024-01-28 DIAGNOSIS — M2141 Flat foot [pes planus] (acquired), right foot: Secondary | ICD-10-CM

## 2024-01-28 DIAGNOSIS — M76821 Posterior tibial tendinitis, right leg: Secondary | ICD-10-CM

## 2024-01-28 NOTE — Progress Notes (Signed)
 Orthotics   Patient was present and evaluated for Custom molded foot orthotics. Patient will benefit from CFO's to provide total contact to BIL MLA's helping to balance and distribute body weight more evenly across BIL feet helping to reduce plantar pressure and pain. Orthotic will also encourage FF / RF alignment  Patient was scanned today and will return for fitting upon receipt

## 2024-02-09 ENCOUNTER — Telehealth: Payer: Self-pay

## 2024-02-09 NOTE — Telephone Encounter (Signed)
 Orthotics in burl bin

## 2024-02-15 ENCOUNTER — Ambulatory Visit (INDEPENDENT_AMBULATORY_CARE_PROVIDER_SITE_OTHER): Admitting: Podiatry

## 2024-02-15 DIAGNOSIS — M76821 Posterior tibial tendinitis, right leg: Secondary | ICD-10-CM

## 2024-02-15 NOTE — Progress Notes (Signed)
 Orthotics were dispensed and functioning well no acute complaints.  Break-in period was discussed

## 2024-02-23 ENCOUNTER — Ambulatory Visit: Admitting: Podiatry

## 2024-03-22 NOTE — Progress Notes (Unsigned)
 GYNECOLOGY ANNUAL PREVENTATIVE CARE ENCOUNTER NOTE  History:     Sierra Moore is a 53 y.o. G14P2002 female here for a routine annual gynecologic exam.  Current complaints: none.   Denies abnormal vaginal bleeding, discharge, pelvic pain, problems with intercourse or other gynecologic concerns.     Social Relationship:married Living:husband Work:part time Exercise:walking 2x a week Smoke/Alcohol/drug use:No/Never/No  Gynecologic History No LMP recorded. Patient is perimenopausal. Contraception: none Last Pap: 03/24/2019. Results were: normal  Last mammogram: 12/22/2022. Results were: normal  Obstetric History OB History  Gravida Para Term Preterm AB Living  2 2 2   2   SAB IAB Ectopic Multiple Live Births      2    # Outcome Date GA Lbr Len/2nd Weight Sex Type Anes PTL Lv  2 Term 10/10/98 [redacted]w[redacted]d  8 lb (3.629 kg) F Vag-Spont EPI N LIV  1 Term 04/08/96 [redacted]w[redacted]d  6 lb 12 oz (3.062 kg) M Vag-Spont Other N LIV    Past Medical History:  Diagnosis Date   Allergy    Arthritis    POLYARTHRALGIA   GERD (gastroesophageal reflux disease)    occ   History of kidney stones    currently as of 12-25-20   Leg swelling    Obesity    PMDD (premenstrual dysphoric disorder)    PMDD (premenstrual dysphoric disorder)    Raynaud disease    Vitamin D  deficiency     Past Surgical History:  Procedure Laterality Date   COLONOSCOPY WITH PROPOFOL  N/A 04/27/2019   Procedure: COLONOSCOPY WITH BIOPSY;  Surgeon: Jinny Carmine, MD;  Location: [facility];  Service: Endoscopy;  Laterality: N/A;   ESOPHAGOGASTRODUODENOSCOPY (EGD) WITH PROPOFOL  N/A 04/27/2019   Procedure: ESOPHAGOGASTRODUODENOSCOPY (EGD) WITH PROPOFOL ;  Surgeon: Jinny Carmine, MD;  Location: [facility];  Service: Endoscopy;  Laterality: N/A;   KNEE ARTHROPLASTY Right 01/03/2021   Procedure: COMPUTER ASSISTED TOTAL KNEE ARTHROPLASTY - RNFA;  Surgeon: Mardee Lynwood SQUIBB, MD;  Location: [facility];  Service:  Orthopedics;  Laterality: Right;   KNEE ARTHROPLASTY Left 11/24/2021   Procedure: COMPUTER ASSISTED TOTAL KNEE ARTHROPLASTY;  Surgeon: Mardee Lynwood SQUIBB, MD;  Location: [facility];  Service: Orthopedics;  Laterality: Left;   POLYPECTOMY N/A 04/27/2019   Procedure: POLYPECTOMY;  Surgeon: Jinny Carmine, MD;  Location: [facility];  Service: Endoscopy;  Laterality: N/A;   ROBOTIC ASSISTED LAPAROSCOPIC CHOLECYSTECTOMY N/A 01/10/2019   Procedure: ROBOTIC ASSISTED LAPAROSCOPIC CHOLECYSTECTOMY;  Surgeon: Jordis Laneta FALCON, MD;  Location: [facility];  Service: General;  Laterality: N/A;   TONSILLECTOMY      Current Outpatient Medications on File Prior to Visit  Medication Sig Dispense Refill   acetaminophen  (TYLENOL ) 500 MG tablet Take 500-1,000 mg by mouth every 6 (six) hours as needed for moderate pain.     Azelaic Acid 15 % gel APPLY TOPICALLY TO THE AFFECTED AREA EVERY NIGHT     estradiol -norethindrone  (ACTIVELLA) 1-0.5 MG tablet Take 1 tablet by mouth daily. 30 tablet 12   hydroxychloroquine (PLAQUENIL) 200 MG tablet Take 200 mg by mouth 2 (two) times daily.     ketoconazole (NIZORAL) 2 % cream Apply topically every morning.     meclizine  (ANTIVERT ) 12.5 MG tablet Take 1 tablet (12.5 mg total) by mouth 3 (three) times daily as needed for dizziness. 30 tablet 0   doxycycline  (VIBRAMYCIN ) 100 MG capsule TAKE 1 CAPSULE BY MOUTH TWICE DAILY WITH LOTS OF WATER  AND A FULL MEAL     No current  facility-administered medications on file prior to visit.    Allergies  Allergen Reactions   Penicillins Hives    IgE = 16 (WNL) on 12/25/2020  Did it involve swelling of the face/tongue/throat, SOB, or low BP? No Did it involve sudden or severe rash/hives, skin peeling, or any reaction on the inside of your mouth or nose? No Did you need to seek medical attention at a hospital or doctor's office? No When did it last happen?     within the last 5 years  If all above answers are NO, may proceed with  cephalosporin use.     Social History:  reports that she quit smoking about 20 years ago. Her smoking use included cigarettes. She started smoking about 30 years ago. She has a 5 pack-year smoking history. She has never used smokeless tobacco. She reports that she does not drink alcohol and does not use drugs.  Family History  Problem Relation Age of Onset   Arthritis Mother    Raynaud syndrome Mother    Arthritis Father    Breast cancer Neg Hx    Bladder Cancer Neg Hx    Kidney cancer Neg Hx     The following portions of the patient's history were reviewed and updated as appropriate: allergies, current medications, past family history, past medical history, past social history, past surgical history and problem list.  Review of Systems Pertinent items noted in HPI and remainder of comprehensive ROS otherwise negative.  Physical Exam:  BP (!) 122/94   Pulse 85   Ht 5' 1 (1.549 m)   Wt 227 lb 9.6 oz (103.2 kg)   BMI 43.00 kg/m  CONSTITUTIONAL: Well-developed, well-nourished female in no acute distress.  HENT:  Normocephalic, atraumatic, External right and left ear normal. Oropharynx is clear and moist EYES: Conjunctivae and EOM are normal. Pupils are equal, round, and reactive to light. No scleral icterus.  NECK: Normal range of motion, supple, no masses.  Normal thyroid .  SKIN: Skin is warm and dry. No rash noted. Not diaphoretic. No erythema. No pallor. MUSCULOSKELETAL: Normal range of motion. No tenderness.  No cyanosis, clubbing, or edema.  2+ distal pulses. NEUROLOGIC: Alert and oriented to person, place, and time. Normal reflexes, muscle tone coordination.  PSYCHIATRIC: Normal mood and affect. Normal behavior. Normal judgment and thought content. CARDIOVASCULAR: Normal heart rate noted, regular rhythm RESPIRATORY: Clear to auscultation bilaterally. Effort and breath sounds normal, no problems with respiration noted. BREASTS: Symmetric in size. No masses, tenderness, skin  changes, nipple drainage, or lymphadenopathy bilaterally.  ABDOMEN: Soft, no distention noted.  No tenderness, rebound or guarding.  PELVIC: Normal appearing external genitalia and urethral meatus; normal appearing vaginal mucosa and cervix.  No abnormal discharge noted.  Pap smear obtained.  Uterus and adnexa difficult to evaluate due to body habitus. Normal uterine size, no other palpable masses, no uterine or adnexal tenderness.     Assessment and Plan:   Annual Well Women Gyn Exam   Pap: Will follow up results of pap smear and manage accordingly. Mammogram : ordered Labs: none Refills: activella Referral: none  Routine preventative health maintenance measures emphasized. Please refer to After Visit Summary for other counseling recommendations.      Zelda Hummer, CNM [facility]  [facility],  [facility]

## 2024-03-23 ENCOUNTER — Other Ambulatory Visit (HOSPITAL_COMMUNITY)
Admission: RE | Admit: 2024-03-23 | Discharge: 2024-03-23 | Disposition: A | Discharge status: Home / Self Care | Source: Ambulatory Visit | Attending: Certified Nurse Midwife | Admitting: Certified Nurse Midwife

## 2024-03-23 ENCOUNTER — Ambulatory Visit (INDEPENDENT_AMBULATORY_CARE_PROVIDER_SITE_OTHER): Admitting: Certified Nurse Midwife

## 2024-03-23 ENCOUNTER — Encounter: Payer: Self-pay | Admitting: Certified Nurse Midwife

## 2024-03-23 VITALS — BP 122/94 | HR 85 | Ht 61.0 in | Wt 227.6 lb

## 2024-03-23 DIAGNOSIS — Z01419 Encounter for gynecological examination (general) (routine) without abnormal findings: Secondary | ICD-10-CM | POA: Diagnosis present

## 2024-03-23 DIAGNOSIS — Z124 Encounter for screening for malignant neoplasm of cervix: Secondary | ICD-10-CM

## 2024-03-23 DIAGNOSIS — Z1231 Encounter for screening mammogram for malignant neoplasm of breast: Secondary | ICD-10-CM

## 2024-03-23 MED ORDER — ESTRADIOL-NORETHINDRONE ACET 1-0.5 MG PO TABS: 1.0000 | ORAL_TABLET | Freq: Every day | ORAL | 3 refills | Status: AC

## 2024-03-23 NOTE — Patient Instructions (Signed)
 Preventive Care 66-55 Years Old, Female Preventive care refers to lifestyle choices and visits with your health care provider that can promote health and wellness. Preventive care visits are also called wellness exams. What can I expect for my preventive care visit? Counseling Your health care provider may ask you questions about your: Medical history, including: Past medical problems. Family medical history. Pregnancy history. Current health, including: Menstrual cycle. Method of birth control. Emotional well-being. Home life and relationship well-being. Sexual activity and sexual health. Lifestyle, including: Alcohol, nicotine or tobacco, and drug use. Access to firearms. Diet, exercise, and sleep habits. Work and work Astronomer. Sunscreen use. Safety issues such as seatbelt and bike helmet use. Physical exam Your health care provider will check your: Height and weight. These may be used to calculate your BMI (body mass index). BMI is a measurement that tells if you are at a healthy weight. Waist circumference. This measures the distance around your waistline. This measurement also tells if you are at a healthy weight and may help predict your risk of certain diseases, such as type 2 diabetes and high blood pressure. Heart rate and blood pressure. Body temperature. Skin for abnormal spots. What immunizations do I need?  Vaccines are usually given at various ages, according to a schedule. Your health care provider will recommend vaccines for you based on your age, medical history, and lifestyle or other factors, such as travel or where you work. What tests do I need? Screening Your health care provider may recommend screening tests for certain conditions. This may include: Lipid and cholesterol levels. Diabetes screening. This is done by checking your blood sugar (glucose) after you have not eaten for a while (fasting). Pelvic exam and Pap test. Hepatitis B test. Hepatitis C  test. HIV (human immunodeficiency virus) test. STI (sexually transmitted infection) testing, if you are at risk. Lung cancer screening. Colorectal cancer screening. Mammogram. Talk with your health care provider about when you should start having regular mammograms. This may depend on whether you have a family history of breast cancer. BRCA-related cancer screening. This may be done if you have a family history of breast, ovarian, tubal, or peritoneal cancers. Bone density scan. This is done to screen for osteoporosis. Talk with your health care provider about your test results, treatment options, and if necessary, the need for more tests. Follow these instructions at home: Eating and drinking  Eat a diet that includes fresh fruits and vegetables, whole grains, lean protein, and low-fat dairy products. Take vitamin and mineral supplements as recommended by your health care provider. Do not drink alcohol if: Your health care provider tells you not to drink. You are pregnant, may be pregnant, or are planning to become pregnant. If you drink alcohol: Limit how much you have to 0-1 drink a day. Know how much alcohol is in your drink. In the [place] one drink equals one 12 oz bottle of beer (355 mL), one 5 oz glass of wine (148 mL), or one 1 oz glass of hard liquor (44 mL). Lifestyle Brush your teeth every morning and night with fluoride toothpaste. Floss one time each day. Exercise for at least 30 minutes 5 or more days each week. Do not use any products that contain nicotine or tobacco. These products include cigarettes, chewing tobacco, and vaping devices, such as e-cigarettes. If you need help quitting, ask your health care provider. Do not use drugs. If you are sexually active, practice safe sex. Use a condom or other form of protection to  prevent STIs. If you do not wish to become pregnant, use a form of birth control. If you plan to become pregnant, see your health care provider for a  prepregnancy visit. Take aspirin only as told by your health care provider. Make sure that you understand how much to take and what form to take. Work with your health care provider to find out whether it is safe and beneficial for you to take aspirin daily. Find healthy ways to manage stress, such as: Meditation, yoga, or listening to music. Journaling. Talking to a trusted person. Spending time with friends and family. Minimize exposure to UV radiation to reduce your risk of skin cancer. Safety Always wear your seat belt while driving or riding in a vehicle. Do not drive: If you have been drinking alcohol. Do not ride with someone who has been drinking. When you are tired or distracted. While texting. If you have been using any mind-altering substances or drugs. Wear a helmet and other protective equipment during sports activities. If you have firearms in your house, make sure you follow all gun safety procedures. Seek help if you have been physically or sexually abused. What's next? Visit your health care provider once a year for an annual wellness visit. Ask your health care provider how often you should have your eyes and teeth checked. Stay up to date on all vaccines. This information is not intended to replace advice given to you by your health care provider. Make sure you discuss any questions you have with your health care provider. Document Revised: 12/18/2020 Document Reviewed: 12/18/2020 Elsevier Patient Education  2024 [employer] How to Do a Breast Self-Exam Doing breast self-exams can help you stay healthy. They're one way to know what's normal for your breasts. They can help you catch a problem while it's still small and can be treated. You need to: Check your breasts often. Tell your doctor about any changes. You should do breast self-exams even if you have breast implants. What you need: A mirror. A well-lit room. A pillow or other soft object. How to do a  breast self-exam Look for changes  Take off all the clothes above your waist. Stand in front of a mirror in a room with good lighting. Put your hands down at your sides. Compare your breasts in the mirror. Look for difference between them, such as: Differences in shape. Differences in size. Wrinkles, dips, and bumps in one breast and not the other. Look at each breast for skin changes, such as: Redness. Scaly spots. Spots where your skin is thicker. Dimpling. Open sores. Look for changes in your nipples, such as: Fluid coming out of a nipple. Fluid around a nipple. Bleeding. Dimpling. Redness. A nipple that looks pushed in or that has changed position. Feel for changes Lie on your back. Feel each breast. To do this: Pick a breast to feel. Place a pillow under the shoulder closest to that breast. Put the arm closest to that breast behind your head. Feel the breast using the hand of your other arm. Use the pads of your three middle fingers to make small circles starting near the nipple. Use light, medium, and firm pressure. Keep making circles, moving down over the breast. Stop when you feel your ribs. Start making circles with your fingers again, this time going up until you reach your collarbone. Then, make circles out across your breast and into your armpit area. Squeeze your nipple. Check for fluid and lumps. Do these steps again  to check your other breast. Sit or stand in the tub or shower. With soapy water on your skin, feel each breast the same way you did when you were lying down. Write down what you find Writing down what you find can help you keep track of what you want to tell your doctor. Write down: What's normal for each breast. Any changes you find. Write down: The kind of change. If your breast feels tender or painful. Any lump you find. Write down its size and where it is. When you last had your period. General tips If you're breastfeeding, the best time  to check your breasts is after you feed your baby or after you use a breast pump. If you get a period, the best time to check your breasts is 5-7 days after your period ends. With time, you'll get more used to doing the self-exam. You'll also start to know if there are changes in your breasts. Contact a doctor if: You see a change in the shape or size of your breasts or nipples. You see a change in the skin of your breast or nipples. You have fluid coming from your nipples that isn't normal. You find a new lump or thick area. You have breast pain. You have any concerns about your breast health. This information is not intended to replace advice given to you by your health care provider. Make sure you discuss any questions you have with your health care provider. Document Revised: 09/01/2023 Document Reviewed: 09/01/2023 Elsevier Patient Education  2025 [employer].

## 2024-03-27 LAB — CYTOLOGY - PAP
Comment: NEGATIVE
Diagnosis: NEGATIVE
High risk HPV: NEGATIVE

## 2024-04-02 ENCOUNTER — Other Ambulatory Visit: Payer: Self-pay | Admitting: Certified Nurse Midwife

## 2024-06-30 ENCOUNTER — Other Ambulatory Visit: Payer: Self-pay | Admitting: Certified Nurse Midwife

## 2024-06-30 DIAGNOSIS — Z1231 Encounter for screening mammogram for malignant neoplasm of breast: Secondary | ICD-10-CM

## 2024-07-24 ENCOUNTER — Ambulatory Visit
Admission: RE | Admit: 2024-07-24 | Discharge: 2024-07-24 | Disposition: A | Source: Ambulatory Visit | Attending: Certified Nurse Midwife

## 2024-07-24 DIAGNOSIS — Z1231 Encounter for screening mammogram for malignant neoplasm of breast: Secondary | ICD-10-CM | POA: Insufficient documentation
